# Patient Record
Sex: Male | Born: 1970
Health system: Southern US, Community
[De-identification: ages and names within clinical notes are randomized; demographics above are authoritative.]

## PROBLEM LIST (undated history)

## (undated) DIAGNOSIS — K219 Gastro-esophageal reflux disease without esophagitis: Secondary | ICD-10-CM

## (undated) DIAGNOSIS — S72001S Fracture of unspecified part of neck of right femur, sequela: Secondary | ICD-10-CM

## (undated) DIAGNOSIS — Z8719 Personal history of other diseases of the digestive system: Secondary | ICD-10-CM

## (undated) DIAGNOSIS — M25562 Pain in left knee: Secondary | ICD-10-CM

## (undated) DIAGNOSIS — M25561 Pain in right knee: Secondary | ICD-10-CM

## (undated) DIAGNOSIS — S3992XA Unspecified injury of lower back, initial encounter: Secondary | ICD-10-CM

## (undated) HISTORY — DX: Pain in left knee: M25.562

## (undated) HISTORY — DX: Unspecified injury of lower back, initial encounter: S39.92XA

## (undated) HISTORY — DX: Fracture of unspecified part of neck of right femur, sequela: S72.001S

## (undated) HISTORY — DX: Pain in right knee: M25.561

## (undated) HISTORY — PX: KNEE SURGERY: SHX244

---

## 2012-04-01 ENCOUNTER — Ambulatory Visit (INDEPENDENT_AMBULATORY_CARE_PROVIDER_SITE_OTHER): Payer: BC Managed Care – PPO | Admitting: Sports Medicine

## 2012-04-01 ENCOUNTER — Ambulatory Visit: Payer: Self-pay | Admitting: Physician Assistant

## 2012-04-01 ENCOUNTER — Telehealth: Payer: Self-pay | Admitting: *Deleted

## 2012-04-01 ENCOUNTER — Encounter: Payer: Self-pay | Admitting: Sports Medicine

## 2012-04-01 VITALS — BP 130/91 | HR 113 | Wt 208.0 lb

## 2012-04-01 DIAGNOSIS — R5383 Other fatigue: Secondary | ICD-10-CM | POA: Insufficient documentation

## 2012-04-01 DIAGNOSIS — M5412 Radiculopathy, cervical region: Secondary | ICD-10-CM | POA: Insufficient documentation

## 2012-04-01 DIAGNOSIS — E291 Testicular hypofunction: Secondary | ICD-10-CM

## 2012-04-01 DIAGNOSIS — M25519 Pain in unspecified shoulder: Secondary | ICD-10-CM

## 2012-04-01 DIAGNOSIS — K529 Noninfective gastroenteritis and colitis, unspecified: Secondary | ICD-10-CM | POA: Insufficient documentation

## 2012-04-01 DIAGNOSIS — S022XXA Fracture of nasal bones, initial encounter for closed fracture: Secondary | ICD-10-CM | POA: Insufficient documentation

## 2012-04-01 DIAGNOSIS — M25512 Pain in left shoulder: Secondary | ICD-10-CM

## 2012-04-01 DIAGNOSIS — E785 Hyperlipidemia, unspecified: Secondary | ICD-10-CM

## 2012-04-01 DIAGNOSIS — R197 Diarrhea, unspecified: Secondary | ICD-10-CM

## 2012-04-01 DIAGNOSIS — R5381 Other malaise: Secondary | ICD-10-CM

## 2012-04-01 NOTE — Assessment & Plan Note (Signed)
Likely related to alcohol intake. Checking Some bloodwork including celiac testing.

## 2012-04-01 NOTE — Progress Notes (Signed)
Subjective:    CC: Establish care.   HPI: Jerry Shannon is a very energetic 42 year old male who works for [employer] who comes in with multiple complaints and requests.  Loose stools: Have been present on and off for some time now, worse after drinking large amounts of beer, but not worse after liquor, worse after eating pasta and some redness. No association with dairy consumption. He does get some burning occasionally when stooling. He would like a multitude of blood tests done which will be documented and the assessment and plan section. He denies any abdominal pain, denies any current blood in stool.  Erectile dysfunction: Since he's been drinking approximately 12 beers on the weekends, he's noted his erections are no longer as strong, or long-lasting as before. He denies alcohol could be a possible cause.  Left shoulder pain: Present for months, localized over the deltoid and worse with overhead activities. Pain does not radiate, is moderate.  Past medical history, Surgical history, Family history not pertinant except as noted below, Social history, Allergies, and medications have been entered into the medical record, reviewed, and no changes needed.   Review of Systems: No headache, visual changes, nausea, vomiting, diarrhea, constipation, dizziness, abdominal pain, skin rash, fevers, chills, night sweats, swollen lymph nodes, weight loss, chest pain, body aches, joint swelling, muscle aches, shortness of breath, mood changes, visual or auditory hallucinations.  Objective:    General: Well Developed, well nourished, and in no acute distress.  Neuro: Alert and oriented x3, extra-ocular muscles intact, sensation grossly intact.  HEENT: Normocephalic, atraumatic, pupils equal round reactive to light, neck supple, no masses, no lymphadenopathy, thyroid nonpalpable.  Skin: Warm and dry, no rashes noted.  Cardiac: Regular rate and rhythm, no murmurs rubs or gallops.  Respiratory: Clear to auscultation  bilaterally. Not using accessory muscles, speaking in full sentences.  Abdominal: Soft, nontender, nondistended, positive bowel sounds, no masses, no organomegaly.  Left Shoulder: Inspection reveals no abnormalities, atrophy or asymmetry. Palpation is normal with no tenderness over AC joint or bicipital groove. ROM is full in all planes. Rotator cuff strength normal throughout. Positive neers, Hawkin's, and empty can signs.Marland Kitchen Speeds and Yergason's tests normal. No labral pathology noted with negative Obrien's, negative clunk and good stability. Normal scapular function observed. No painful arc and no drop arm sign. No apprehension sign  Impression and Recommendations:    The patient was counselled, risk factors were discussed, anticipatory guidance given.

## 2012-04-01 NOTE — Assessment & Plan Note (Signed)
Checking some blood work.  

## 2012-04-01 NOTE — Assessment & Plan Note (Signed)
Symptoms most likely related to rotator cuff tear. We'll start conservatively with ibuprofen, home rehabilitation. He will come back to see me in 4 weeks at which point if not better we can scan his cuff and consider an ultrasound guided injection.

## 2012-04-01 NOTE — Addendum Note (Signed)
Addended by: Monica Becton on: 04/01/2012 02:47 PM   Modules accepted: Orders

## 2012-04-01 NOTE — Assessment & Plan Note (Signed)
Patient desires a referral. Specifically states not plastic and reconstructive surgeon. I will place a referral for otolaryngology.

## 2012-04-02 LAB — COMPREHENSIVE METABOLIC PANEL
AST: 31 U/L (ref 0–37)
Albumin: 4.8 g/dL (ref 3.5–5.2)
Alkaline Phosphatase: 61 U/L (ref 39–117)
Calcium: 9.8 mg/dL (ref 8.4–10.5)
Chloride: 106 mEq/L (ref 96–112)
Potassium: 4.2 mEq/L (ref 3.5–5.3)
Sodium: 141 mEq/L (ref 135–145)
Total Protein: 7.1 g/dL (ref 6.0–8.3)

## 2012-04-02 LAB — CBC
HCT: 44.6 % (ref 39.0–52.0)
Hemoglobin: 15.8 g/dL (ref 13.0–17.0)
MCH: 32.6 pg (ref 26.0–34.0)
MCHC: 35.4 g/dL (ref 30.0–36.0)
MCV: 92 fL (ref 78.0–100.0)
Platelets: 339 K/uL (ref 150–400)
RBC: 4.85 MIL/uL (ref 4.22–5.81)
RDW: 13.2 % (ref 11.5–15.5)
WBC: 7.9 10*3/uL (ref 4.0–10.5)

## 2012-04-02 LAB — COMPREHENSIVE METABOLIC PANEL WITH GFR
ALT: 38 U/L (ref 0–53)
BUN: 9 mg/dL (ref 6–23)
CO2: 26 meq/L (ref 19–32)
Creat: 1.06 mg/dL (ref 0.50–1.35)
Glucose, Bld: 87 mg/dL (ref 70–99)
Total Bilirubin: 0.8 mg/dL (ref 0.3–1.2)

## 2012-04-02 LAB — TSH: TSH: 2.043 u[IU]/mL (ref 0.350–4.500)

## 2012-04-02 LAB — T4, FREE: Free T4: 1.08 ng/dL (ref 0.80–1.80)

## 2012-04-02 LAB — VITAMIN D 25 HYDROXY (VIT D DEFICIENCY, FRACTURES): Vit D, 25-Hydroxy: 43 ng/mL (ref 30–89)

## 2012-04-02 LAB — LIPID PANEL
Cholesterol: 329 mg/dL — ABNORMAL HIGH (ref 0–200)
HDL: 35 mg/dL — ABNORMAL LOW (ref >39)
LDL Cholesterol: 218 mg/dL — ABNORMAL HIGH (ref 0–99)
Total CHOL/HDL Ratio: 9.4 ratio
Triglycerides: 382 mg/dL — ABNORMAL HIGH (ref <150)
VLDL: 76 mg/dL — ABNORMAL HIGH (ref 0–40)

## 2012-04-02 LAB — LIPASE: Lipase: 22 U/L (ref 0–75)

## 2012-04-02 LAB — T3, FREE: T3, Free: 3.6 pg/mL (ref 2.3–4.2)

## 2012-04-02 LAB — AMYLASE: Amylase: 54 U/L (ref 0–105)

## 2012-04-04 LAB — TESTOSTERONE, FREE, TOTAL, SHBG
Sex Hormone Binding: 31 nmol/L (ref 13–71)
Testosterone, Free: 36.8 pg/mL — ABNORMAL LOW (ref 47.0–244.0)
Testosterone-% Free: 2 % (ref 1.6–2.9)
Testosterone: 187.52 ng/dL — ABNORMAL LOW (ref 300–890)

## 2012-04-04 LAB — ANCA SCREEN W REFLEX TITER
Atypical p-ANCA Screen: NEGATIVE
c-ANCA Screen: NEGATIVE
p-ANCA Screen: NEGATIVE

## 2012-04-04 LAB — GLIA (IGA/G) + TTG IGA
Deamidated Gliadin Abs, IgG: 5.9 U/mL (ref <20)
Gliadin IgA: 7 U/mL (ref <20)
Tissue Transglutaminase Ab, IgA: 3.1 U/mL (ref <20)

## 2012-04-04 LAB — TISSUE TRANSGLUTAMINASE, IGG: t-Transglutaminase (tTG) IgG: 9 U/mL (ref <20)

## 2012-04-05 LAB — ENDOMYSIAL AB IGA RFLX TITER: Endomysial Screen: NEGATIVE

## 2012-04-05 MED ORDER — TESTOSTERONE 40.5 MG/2.5GM (1.62%) TD GEL: 1.0000 "application " | Freq: Every day | TRANSDERMAL | Status: DC

## 2012-04-05 NOTE — Addendum Note (Signed)
Addended by: Monica Becton on: 04/05/2012 04:08 PM   Modules accepted: Orders

## 2012-04-11 LAB — SACCHAROMYCES CEREVISIAE ANTIBODIES, IGG AND IGA
Saccharomyces cerevisiae IgG: 14.1 Units (ref <=20.0)
Saccharomyces cerevisiae, IgA: 13.4 Units (ref <=20.0)

## 2012-04-14 ENCOUNTER — Telehealth: Payer: Self-pay | Admitting: *Deleted

## 2012-04-14 DIAGNOSIS — E291 Testicular hypofunction: Secondary | ICD-10-CM

## 2012-04-14 NOTE — Telephone Encounter (Signed)
Plastic surgeons generally come in 3 forms, general surgery (great at tummy tucks and breast augmentations), ENT (Great at nasal surgery/deformities), and ophthalmology (great at eyelids, etc etc.)  ENT would probably be the best bet for what he has going on.  He should certainly ask them if there are any options regarding the vein on his nose.  Also after he finishes androgel, we can start testosterone injections.  I would like him to come here q2weeks for shots until we get his levels stabilized then he can do them at home.  We should also recheck his levels after 3 weeks of the androgel treatment.  I will place a future order, if levels are normalized and symptoms not better/unchanged, then no need to even try injections.

## 2012-04-14 NOTE — Telephone Encounter (Signed)
Pt informed and agrees.

## 2012-04-14 NOTE — Telephone Encounter (Signed)
Pt is asking for another alternative for his testosterone med. States the Androgel was $250 after insurance. He is also asking if ENT can fix the blue veins on his nose with the breathing issue and fracture.

## 2012-04-23 ENCOUNTER — Other Ambulatory Visit: Payer: Self-pay

## 2012-04-29 ENCOUNTER — Encounter: Payer: Self-pay | Admitting: Sports Medicine

## 2012-04-29 ENCOUNTER — Ambulatory Visit (INDEPENDENT_AMBULATORY_CARE_PROVIDER_SITE_OTHER): Payer: BC Managed Care – PPO | Admitting: Sports Medicine

## 2012-04-29 VITALS — BP 134/87 | HR 93 | Wt 207.0 lb

## 2012-04-29 DIAGNOSIS — E7849 Other hyperlipidemia: Secondary | ICD-10-CM | POA: Insufficient documentation

## 2012-04-29 DIAGNOSIS — M5416 Radiculopathy, lumbar region: Secondary | ICD-10-CM | POA: Insufficient documentation

## 2012-04-29 DIAGNOSIS — Z79890 Hormone replacement therapy (postmenopausal): Secondary | ICD-10-CM | POA: Insufficient documentation

## 2012-04-29 DIAGNOSIS — E291 Testicular hypofunction: Secondary | ICD-10-CM

## 2012-04-29 DIAGNOSIS — E785 Hyperlipidemia, unspecified: Secondary | ICD-10-CM

## 2012-04-29 DIAGNOSIS — M545 Low back pain, unspecified: Secondary | ICD-10-CM

## 2012-04-29 DIAGNOSIS — S022XXA Fracture of nasal bones, initial encounter for closed fracture: Secondary | ICD-10-CM

## 2012-04-29 DIAGNOSIS — R197 Diarrhea, unspecified: Secondary | ICD-10-CM

## 2012-04-29 DIAGNOSIS — M25512 Pain in left shoulder: Secondary | ICD-10-CM

## 2012-04-29 DIAGNOSIS — M25519 Pain in unspecified shoulder: Secondary | ICD-10-CM

## 2012-04-29 DIAGNOSIS — K529 Noninfective gastroenteritis and colitis, unspecified: Secondary | ICD-10-CM

## 2012-04-29 LAB — LIPID PANEL
Cholesterol: 366 mg/dL — ABNORMAL HIGH (ref 0–200)
HDL: 39 mg/dL — ABNORMAL LOW (ref >39)
Total CHOL/HDL Ratio: 9.4 Ratio
Triglycerides: 495 mg/dL — ABNORMAL HIGH (ref <150)

## 2012-04-29 LAB — CBC
HCT: 47.1 % (ref 39.0–52.0)
Hemoglobin: 16.4 g/dL (ref 13.0–17.0)
MCH: 32.7 pg (ref 26.0–34.0)
MCHC: 34.8 g/dL (ref 30.0–36.0)
MCV: 93.8 fL (ref 78.0–100.0)
Platelets: 300 K/uL (ref 150–400)
RBC: 5.02 MIL/uL (ref 4.22–5.81)
RDW: 13.1 % (ref 11.5–15.5)
WBC: 6.5 10*3/uL (ref 4.0–10.5)

## 2012-04-29 NOTE — Progress Notes (Signed)
Subjective:    CC: Followup  HPI: Male hypogonadism: Has been using AndroGel but this is extremely expensive. He does note an improvement in how he feels. He is somewhat resistant to trying injections, will but will do this if this is the only option.  Left shoulder pain: Improve with rotator cuff exercises as well as decreasing his weight. He desires not to pursue injection.  Low back pain: Amenable to discussing a future visit.  Hyperlipidemia: Has been doing diet and lifestyle modification and is ready for repeat lipid panel.  History of nasal fracture: Canceled his appointment with ear nose and throat.  Chronic diarrhea: Has improved since cutting back on alcohol intake, celiac testing was negative.  Past medical history, Surgical history, Family history not pertinant except as noted below, Social history, Allergies, and medications have been entered into the medical record, reviewed, and no changes needed.   Review of Systems: No fevers, chills, night sweats, weight loss, chest pain, or shortness of breath.   Objective:    General: Well Developed, well nourished, and in no acute distress.  Neuro: Alert and oriented x3, extra-ocular muscles intact, sensation grossly intact.  HEENT: Normocephalic, atraumatic, pupils equal round reactive to light, neck supple, no masses, no lymphadenopathy, thyroid nonpalpable.  Skin: Warm and dry, no rashes. Cardiac: Regular rate and rhythm, no murmurs rubs or gallops.  Respiratory: Clear to auscultation bilaterally. Not using accessory muscles, speaking in full sentences. Impression and Recommendations:

## 2012-04-29 NOTE — Assessment & Plan Note (Signed)
We'll discuss further at future visits.

## 2012-04-29 NOTE — Assessment & Plan Note (Signed)
Continues to improve, continue rotator cuff rehabilitation exercises. Did decline injection, we will keep this in the back of our minds.

## 2012-04-29 NOTE — Assessment & Plan Note (Signed)
Canceled appointment with otolaryngology.

## 2012-04-29 NOTE — Assessment & Plan Note (Signed)
AndroGel was too expensive. We will check with the insurance company to see if there is a cheaper alternative, otherwise we'll proceed with testosterone injections.

## 2012-04-29 NOTE — Assessment & Plan Note (Signed)
Celiac workup negative. Most likely due to excessive alcohol intake, he is cutting back. I recommended that he cut back slowly to avoid withdrawal.

## 2012-04-29 NOTE — Assessment & Plan Note (Signed)
Rechecking a diet modification. If still elevated need to consider statin.

## 2012-05-02 LAB — TESTOSTERONE, FREE, TOTAL, SHBG
Sex Hormone Binding: 27 nmol/L (ref 13–71)
Testosterone, Free: 98.4 pg/mL (ref 47.0–244.0)
Testosterone-% Free: 2.3 % (ref 1.6–2.9)
Testosterone: 429 ng/dL (ref 300–890)

## 2012-06-06 ENCOUNTER — Telehealth: Payer: Self-pay | Admitting: *Deleted

## 2012-06-06 DIAGNOSIS — E291 Testicular hypofunction: Secondary | ICD-10-CM

## 2012-06-06 DIAGNOSIS — E785 Hyperlipidemia, unspecified: Secondary | ICD-10-CM

## 2012-06-07 ENCOUNTER — Ambulatory Visit (INDEPENDENT_AMBULATORY_CARE_PROVIDER_SITE_OTHER): Payer: BC Managed Care – PPO | Admitting: Sports Medicine

## 2012-06-07 ENCOUNTER — Encounter: Payer: Self-pay | Admitting: Sports Medicine

## 2012-06-07 VITALS — BP 118/81 | HR 78 | Wt 196.0 lb

## 2012-06-07 DIAGNOSIS — M545 Low back pain, unspecified: Secondary | ICD-10-CM

## 2012-06-07 LAB — LIPID PANEL
Cholesterol: 261 mg/dL — ABNORMAL HIGH (ref 0–200)
HDL: 42 mg/dL (ref >39)
LDL Cholesterol: 170 mg/dL — ABNORMAL HIGH (ref 0–99)
Total CHOL/HDL Ratio: 6.2 Ratio
Triglycerides: 243 mg/dL — ABNORMAL HIGH (ref <150)
VLDL: 49 mg/dL — ABNORMAL HIGH (ref 0–40)

## 2012-06-07 MED ORDER — MELOXICAM 15 MG PO TABS: ORAL_TABLET | ORAL | Status: DC

## 2012-06-07 MED ORDER — NAPROXEN 500 MG PO TABS: 500.0000 mg | ORAL_TABLET | Freq: Two times a day (BID) | ORAL | Status: DC

## 2012-06-07 MED ORDER — CYCLOBENZAPRINE HCL 10 MG PO TABS: ORAL_TABLET | ORAL | Status: DC

## 2012-06-07 NOTE — Assessment & Plan Note (Signed)
Multilevel degenerative disc disease by patient report in thoracic and lumbar spine. He declines narcotics, tramadol, steroids. He will work on getting her MRI reports as well as discs. We will start conservatively with Mobic, cyclobenzaprine. Home exercises, topical creams. He would come back with MRI discs and we can proceed with interventional injection planning.  He does want printed out prescriptions for naproxen and meloxicam, and he is going to choose only one.

## 2012-06-07 NOTE — Progress Notes (Signed)
  Subjective:    CC: Low back pain  HPI: Jerry Shannon comes in with several years of pain he localizes in his mid to lower lumbar spine. He recalls an episode decades ago of trying to lift an object, feeling a pop, and having back pain since. In the past he has gone to [facility], and MRI of the lumbar and thoracic spine was done showing multilevel thoracic and lumbar degenerative disc disease, facet spondylosis, and spinal stenosis. He does not know the exact levels involved. Currently he is having a flare, pain is in his low back, without much radiation. Worse with flexion and worse with Valsalva. Denies any bowel or bladder changes. He desires to avoid tramadol, narcotics.  Past medical history, Surgical history, Family history not pertinant except as noted below, Social history, Allergies, and medications have been entered into the medical record, reviewed, and no changes needed.   Review of Systems: No fevers, chills, night sweats, weight loss, chest pain, or shortness of breath.   Objective:    General: Well Developed, well nourished, and in no acute distress.  Neuro: Alert and oriented x3, extra-ocular muscles intact, sensation grossly intact.  HEENT: Normocephalic, atraumatic, pupils equal round reactive to light, neck supple, no masses, no lymphadenopathy, thyroid nonpalpable.  Skin: Warm and dry, no rashes. Cardiac: Regular rate and rhythm, no murmurs rubs or gallops.  Respiratory: Clear to auscultation bilaterally. Not using accessory muscles, speaking in full sentences. Back Exam:  Inspection: Unremarkable  Motion: Flexion 45 deg, Extension 45 deg, Side Bending to 45 deg bilaterally,  Rotation to 45 deg bilaterally  SLR laying: Negative  XSLR laying: Negative  Palpable tenderness: None. FABER: negative. Sensory change: Gross sensation intact to all lumbar and sacral dermatomes.  Reflexes: 2+ at both patellar tendons, 2+ at achilles tendons, Babinski's downgoing.    Strength at foot  Plantar-flexion: 5/5 Dorsi-flexion: 5/5 Eversion: 5/5 Inversion: 5/5  Leg strength  Quad: 5/5 Hamstring: 5/5 Hip flexor: 5/5 Hip abductors: 5/5  Gait unremarkable. Impression and Recommendations:

## 2012-06-08 LAB — TESTOSTERONE, FREE, TOTAL, SHBG
Sex Hormone Binding: 41 nmol/L (ref 13–71)
Testosterone, Free: 77 pg/mL (ref 47.0–244.0)
Testosterone-% Free: 1.8 % (ref 1.6–2.9)
Testosterone: 430 ng/dL (ref 300–890)

## 2012-07-20 ENCOUNTER — Telehealth: Payer: Self-pay | Admitting: *Deleted

## 2012-07-20 DIAGNOSIS — E785 Hyperlipidemia, unspecified: Secondary | ICD-10-CM

## 2012-07-20 NOTE — Telephone Encounter (Signed)
Pt informed that he can go to lab Friday

## 2012-07-22 LAB — LIPID PANEL
Cholesterol: 261 mg/dL — ABNORMAL HIGH (ref 0–200)
HDL: 36 mg/dL — ABNORMAL LOW (ref >39)
LDL Cholesterol: 184 mg/dL — ABNORMAL HIGH (ref 0–99)
Total CHOL/HDL Ratio: 7.3 ratio
Triglycerides: 206 mg/dL — ABNORMAL HIGH (ref <150)
VLDL: 41 mg/dL — ABNORMAL HIGH (ref 0–40)

## 2012-08-03 ENCOUNTER — Telehealth: Payer: Self-pay | Admitting: *Deleted

## 2012-08-03 MED ORDER — NIACIN ER (ANTIHYPERLIPIDEMIC) 1000 MG PO TBCR: 1000.0000 mg | EXTENDED_RELEASE_TABLET | Freq: Every day | ORAL | Status: DC

## 2012-08-03 MED ORDER — OMEGA-3-ACID ETHYL ESTERS 1 G PO CAPS: 2.0000 g | ORAL_CAPSULE | Freq: Two times a day (BID) | ORAL | Status: DC

## 2012-08-03 NOTE — Telephone Encounter (Signed)
Pt has called wanting to know if you can call him in some Niacin and fish oil for him to take for his lipids. He is also asking for samples of Cialis. Please advise.  Jerry Cory, LPN

## 2012-08-03 NOTE — Telephone Encounter (Signed)
Called in. OK for Cialis samples, may give one box of the 5 mg tabs.

## 2012-08-04 NOTE — Telephone Encounter (Signed)
Pt informed

## 2013-01-12 ENCOUNTER — Other Ambulatory Visit: Payer: Self-pay

## 2013-06-16 ENCOUNTER — Encounter: Payer: Self-pay | Admitting: Sports Medicine

## 2013-06-16 ENCOUNTER — Ambulatory Visit (INDEPENDENT_AMBULATORY_CARE_PROVIDER_SITE_OTHER): Payer: BC Managed Care – PPO | Admitting: Sports Medicine

## 2013-06-16 VITALS — BP 132/83 | HR 110 | Wt 197.0 lb

## 2013-06-16 DIAGNOSIS — M5416 Radiculopathy, lumbar region: Secondary | ICD-10-CM

## 2013-06-16 DIAGNOSIS — IMO0002 Reserved for concepts with insufficient information to code with codable children: Secondary | ICD-10-CM

## 2013-06-16 DIAGNOSIS — E291 Testicular hypofunction: Secondary | ICD-10-CM

## 2013-06-16 DIAGNOSIS — M5412 Radiculopathy, cervical region: Secondary | ICD-10-CM

## 2013-06-16 DIAGNOSIS — E785 Hyperlipidemia, unspecified: Secondary | ICD-10-CM

## 2013-06-16 LAB — LIPID PANEL
Cholesterol: 364 mg/dL — ABNORMAL HIGH (ref 0–200)
HDL: 49 mg/dL (ref >39)
Total CHOL/HDL Ratio: 7.4 ratio
Triglycerides: 440 mg/dL — ABNORMAL HIGH (ref <150)

## 2013-06-16 NOTE — Assessment & Plan Note (Signed)
Clinically left-sided C5, MRI from 2013 to C5-6 and C6-C7 degenerative disc disease. He will do naproxen twice a day, and we can do an interlaminar epidural if he has no response.

## 2013-06-16 NOTE — Assessment & Plan Note (Signed)
Clinically left L4, MRI from 2013 shows L4-5 and L5-S1 disc protrusion. We discussed the pathophysiology, as well as treatment course for this, he is amenable to doing Aleve twice a day with TUMS, and will return to see me if no better, at which point we could proceed with a left-sided L4-L5 epidural.

## 2013-06-16 NOTE — Assessment & Plan Note (Signed)
Checking lipids

## 2013-06-16 NOTE — Progress Notes (Signed)
Patient ID: Jerry FenderLarry Shannon, male   DOB: 1970-10-25, 43 y.o.   MRN: [id number]  Subjective:    CC: Upper and Lower back pain  HPI:  Jerry Shannon presents today for f/u on his upper and lower back and leg pain today.  He was last seen here one year ago for the same symptoms and was referred for PT and told to bring back copies of past MRI.  MRI showed C5-C6 cord deformity secondary to disc bulging as well as L4-L5 left sided disc protrusion.  Today his symptoms are relatively unchanged from one year ago and consist of mid back pain and muscle spasm as well as burning left leg pain in a predominately L5 distribution.  He states that ice and aleve occasionally alleviates his back pain to a degree but has been unable to find anything that alleviates his leg pain.  He has been seeing a Chiropodistchiropracter and a massage therapist who have been able to "loosen" him up, but he remains in enough pain and stiffness that he is fearful it may soon effect his job.  Patient does not endorse any other symptoms.    Past medical history, Surgical history, Family history not pertinant except as noted below, Social history, Allergies, and medications have been entered into the medical record, reviewed, and no changes needed.   Review of Systems: No fevers, chills, night sweats, weight loss, chest pain, or shortness of breath.   Objective:    General: Well Developed, well nourished, and in no acute distress.  Neuro: Alert and oriented x3, extra-ocular muscles intact, sensation grossly intact.  HEENT: Normocephalic, atraumatic, pupils equal round reactive to light, neck supple, no masses, no lymphadenopathy, thyroid nonpalpable.  Skin: Warm and dry, no rashes. Cardiac: Regular rate and rhythm, no murmurs rubs or gallops, no lower extremity edema.  Respiratory: Clear to auscultation bilaterally. Not using accessory muscles, speaking in full sentences. Back Exam:  Inspection: Unremarkable  Motion: Flexion >45 deg, Extension >45 deg,  Side Bending to 45 deg bilaterally,  Rotation to 45 deg bilaterally  SLR laying: Negative  XSLR laying: Negative  Palpable tenderness: diffusely tender to palpation over entire back. FABER: negative. Sensory change: Gross sensation intact to all lumbar and sacral dermatomes.  Reflexes: 2+ at both patellar tendons, 2+ at achilles tendons, Babinski's downgoing.  Strength at foot  Plantar-flexion: 5/5 Dorsi-flexion: 5/5 Eversion: 5/5 Inversion: 5/5  Leg strength  Quad: 5/5 Hamstring: 4/5 Hip flexor: 5/5 Hip abductors: 5/5  Gait unremarkable.     Impression and Recommendations:    Clinical symptoms correlate with known disc protrusions shown on past MRI.  The patient is amenable to conservative therapy at this point, however does not want steroids.     C5 radiculitis -naproxen bid -continue stretches at home  Lumbar radiculopathy -naproxen bid -stretches  Health Maintenance -check lipids -recheck testosterone

## 2013-06-16 NOTE — Assessment & Plan Note (Signed)
Rechecking testosterone levels. 

## 2013-06-17 LAB — TESTOSTERONE: Testosterone: 549 ng/dL (ref 300–890)

## 2013-06-19 ENCOUNTER — Other Ambulatory Visit: Payer: Self-pay

## 2013-06-19 ENCOUNTER — Encounter: Payer: Self-pay | Admitting: Sports Medicine

## 2013-06-19 DIAGNOSIS — M545 Low back pain, unspecified: Secondary | ICD-10-CM

## 2013-06-19 MED ORDER — NIACIN ER (ANTIHYPERLIPIDEMIC) 1000 MG PO TBCR: 1000.0000 mg | EXTENDED_RELEASE_TABLET | Freq: Every day | ORAL | Status: DC

## 2013-06-19 MED ORDER — OMEGA-3-ACID ETHYL ESTERS 1 G PO CAPS: 2.0000 g | ORAL_CAPSULE | Freq: Two times a day (BID) | ORAL | Status: DC

## 2013-06-19 MED ORDER — TESTOSTERONE 40.5 MG/2.5GM (1.62%) TD GEL: 1.0000 "application " | Freq: Every day | TRANSDERMAL | Status: DC

## 2013-06-19 MED ORDER — CYCLOBENZAPRINE HCL 10 MG PO TABS: ORAL_TABLET | ORAL | Status: DC

## 2013-06-19 MED ORDER — NAPROXEN 500 MG PO TABS: 500.0000 mg | ORAL_TABLET | Freq: Two times a day (BID) | ORAL | Status: DC

## 2013-06-19 NOTE — Telephone Encounter (Signed)
Patient request refill for Flexeril, Naproxen, Niacin, Testosterone, and Lovaza. Rx faxed to [facility] [phone]

## 2013-06-20 ENCOUNTER — Other Ambulatory Visit: Payer: Self-pay

## 2013-06-20 MED ORDER — IBUPROFEN 800 MG PO TABS: 800.0000 mg | ORAL_TABLET | Freq: Three times a day (TID) | ORAL | Status: DC | PRN

## 2013-07-07 ENCOUNTER — Ambulatory Visit: Payer: BC Managed Care – PPO | Admitting: Sports Medicine

## 2013-10-20 ENCOUNTER — Ambulatory Visit (INDEPENDENT_AMBULATORY_CARE_PROVIDER_SITE_OTHER): Payer: BC Managed Care – PPO | Admitting: Sports Medicine

## 2013-10-20 ENCOUNTER — Encounter: Payer: Self-pay | Admitting: Sports Medicine

## 2013-10-20 VITALS — BP 143/96 | HR 96 | Ht 67.0 in | Wt 196.0 lb

## 2013-10-20 DIAGNOSIS — L03115 Cellulitis of right lower limb: Secondary | ICD-10-CM | POA: Insufficient documentation

## 2013-10-20 DIAGNOSIS — L03119 Cellulitis of unspecified part of limb: Secondary | ICD-10-CM

## 2013-10-20 DIAGNOSIS — IMO0002 Reserved for concepts with insufficient information to code with codable children: Secondary | ICD-10-CM

## 2013-10-20 DIAGNOSIS — B356 Tinea cruris: Secondary | ICD-10-CM

## 2013-10-20 DIAGNOSIS — M5416 Radiculopathy, lumbar region: Secondary | ICD-10-CM

## 2013-10-20 DIAGNOSIS — L02419 Cutaneous abscess of limb, unspecified: Secondary | ICD-10-CM

## 2013-10-20 MED ORDER — CLOTRIMAZOLE-BETAMETHASONE 1-0.05 % EX CREA: 1.0000 "application " | TOPICAL_CREAM | Freq: Two times a day (BID) | CUTANEOUS | Status: DC

## 2013-10-20 MED ORDER — MUPIROCIN 2 % EX OINT: TOPICAL_OINTMENT | CUTANEOUS | Status: DC

## 2013-10-20 NOTE — Assessment & Plan Note (Signed)
Topical mupirocin

## 2013-10-20 NOTE — Assessment & Plan Note (Addendum)
At this point we are going to proceed with a left-sided L5-S1 interlaminar epidural. Patient will bring the MRI disc with him. Patient will also return for custom orthotics per request.

## 2013-10-20 NOTE — Progress Notes (Signed)
  Subjective:    CC: Followup  HPI: Jerry Shannon is a very pleasant 43 year old male, he has known lumbar degenerative disease, unfortunately about a month ago he was pushing a lawnmower, felt a pop, with immediate pain and spasm with radiation down his left leg in an L5 versus an S1 distribution, he has already had an MRI at an outside facility that showed moderate L5-S1 degenerative disc disease. He has failed conservative measures in the form of steroids, NSAIDs, amitriptyline, and therapy.  Jock itch: Desires a cream.  Insect bite: Somewhat painful, desires antibiotic cream   Past medical history, Surgical history, Family history not pertinant except as noted below, Social history, Allergies, and medications have been entered into the medical record, reviewed, and no changes needed.   Review of Systems: No fevers, chills, night sweats, weight loss, chest pain, or shortness of breath.   Objective:    General: Well Developed, well nourished, and in no acute distress.  Neuro: Alert and oriented x3, extra-ocular muscles intact, sensation grossly intact.  HEENT: Normocephalic, atraumatic, pupils equal round reactive to light, neck supple, no masses, no lymphadenopathy, thyroid nonpalpable.  Skin: Warm and dry, no rashes. Cardiac: Regular rate and rhythm, no murmurs rubs or gallops, no lower extremity edema.  Respiratory: Clear to auscultation bilaterally. Not using accessory muscles, speaking in full sentences. Back Exam:  Inspection: Unremarkable  Motion: Flexion 45 deg, Extension 45 deg, Side Bending to 45 deg bilaterally,  Rotation to 45 deg bilaterally  SLR laying: Negative  XSLR laying: Negative  Palpable tenderness: None. FABER: negative. Sensory change: Gross sensation intact to all lumbar and sacral dermatomes.  Reflexes: 2+ at both patellar tendons, 2+ at achilles tendons, Babinski's downgoing.  Strength at foot  Plantar-flexion: 5/5 Dorsi-flexion: 5/5 Eversion: 5/5 Inversion: 5/5   Leg strength  Quad: 5/5 Hamstring: 5/5 Hip flexor: 5/5 Hip abductors: 5/5  Gait unremarkable.  Impression and Recommendations:

## 2013-10-20 NOTE — Patient Instructions (Signed)
Please bring MRI disc to epidural injection.

## 2013-10-20 NOTE — Assessment & Plan Note (Signed)
Lotrisone 

## 2013-10-27 ENCOUNTER — Ambulatory Visit
Admission: RE | Admit: 2013-10-27 | Discharge: 2013-10-27 | Disposition: A | Discharge status: Home / Self Care | Payer: BC Managed Care – PPO | Source: Ambulatory Visit | Attending: Sports Medicine | Admitting: Sports Medicine

## 2013-10-27 VITALS — BP 144/88 | HR 80

## 2013-10-27 DIAGNOSIS — M545 Low back pain, unspecified: Secondary | ICD-10-CM

## 2013-10-27 MED ORDER — METHYLPREDNISOLONE ACETATE 40 MG/ML INJ SUSP (RADIOLOG
120.0000 mg | Freq: Once | INTRAMUSCULAR | Status: AC
  Administered 2013-10-27: 120 mg via EPIDURAL

## 2013-10-27 MED ORDER — IOHEXOL 180 MG/ML  SOLN
1.0000 mL | Freq: Once | INTRAMUSCULAR | Status: AC | PRN
  Administered 2013-10-27: 1 mL via EPIDURAL

## 2013-10-27 NOTE — Discharge Instructions (Signed)

## 2013-10-30 ENCOUNTER — Encounter: Payer: BC Managed Care – PPO | Admitting: Sports Medicine

## 2013-10-30 DIAGNOSIS — Z0289 Encounter for other administrative examinations: Secondary | ICD-10-CM

## 2014-08-20 ENCOUNTER — Telehealth: Payer: Self-pay | Admitting: Sports Medicine

## 2014-08-20 DIAGNOSIS — M5416 Radiculopathy, lumbar region: Secondary | ICD-10-CM

## 2014-08-20 NOTE — Telephone Encounter (Signed)
Pt called and wants to know if you can send a referal to  [facility] [place] since he is only home every 2 weeks. He also want to know if he can have a prescription for dds300 since he sits for 10 to 12 hours a day

## 2014-08-21 MED ORDER — AMBULATORY NON FORMULARY MEDICATION: Status: DC

## 2014-08-21 NOTE — Telephone Encounter (Signed)
The name of therapy place is [facility] Address: [address] [place] [address] Phone # [phone] and Dr's name is Lenon Oms  Requesting Chiropractic, decompression, therapy, and deep tissue massage  The presciption should be dds500 and its made by [facility] because the 300 is not covered by his insurance

## 2014-08-21 NOTE — Telephone Encounter (Addendum)
Rx in box. Also there is no "[facility]"  He needs to get me an actual name, and specialty of a provider he wants to see or the actual name of the office.

## 2014-08-21 NOTE — Addendum Note (Signed)
Addended by: Monica Becton on: 08/21/2014 07:51 PM   Modules accepted: Orders

## 2014-08-21 NOTE — Telephone Encounter (Addendum)
Prescription already written for the back brace, I have now placed a referral for the chiropractor of his choice.

## 2014-08-21 NOTE — Addendum Note (Signed)
Addended by: Monica Becton on: 08/21/2014 08:47 AM   Modules accepted: Orders

## 2014-08-24 ENCOUNTER — Other Ambulatory Visit: Payer: Self-pay | Admitting: Sports Medicine

## 2014-08-24 MED ORDER — AMBULATORY NON FORMULARY MEDICATION: Status: DC

## 2014-08-24 NOTE — Telephone Encounter (Signed)
Pt needs printed Rx to take to specialty pharmacy for fill.

## 2014-09-07 ENCOUNTER — Ambulatory Visit: Payer: Self-pay | Admitting: Sports Medicine

## 2014-09-21 ENCOUNTER — Encounter: Payer: Self-pay | Admitting: Sports Medicine

## 2014-09-21 ENCOUNTER — Ambulatory Visit (INDEPENDENT_AMBULATORY_CARE_PROVIDER_SITE_OTHER): Payer: 59

## 2014-09-21 ENCOUNTER — Ambulatory Visit (INDEPENDENT_AMBULATORY_CARE_PROVIDER_SITE_OTHER): Payer: 59 | Admitting: Sports Medicine

## 2014-09-21 VITALS — BP 130/92 | HR 98 | Ht 67.0 in | Wt 199.0 lb

## 2014-09-21 DIAGNOSIS — M25561 Pain in right knee: Secondary | ICD-10-CM

## 2014-09-21 DIAGNOSIS — N5201 Erectile dysfunction due to arterial insufficiency: Secondary | ICD-10-CM

## 2014-09-21 DIAGNOSIS — M25562 Pain in left knee: Secondary | ICD-10-CM

## 2014-09-21 DIAGNOSIS — G4700 Insomnia, unspecified: Secondary | ICD-10-CM

## 2014-09-21 DIAGNOSIS — M170 Bilateral primary osteoarthritis of knee: Secondary | ICD-10-CM | POA: Insufficient documentation

## 2014-09-21 DIAGNOSIS — M5416 Radiculopathy, lumbar region: Secondary | ICD-10-CM

## 2014-09-21 DIAGNOSIS — M7652 Patellar tendinitis, left knee: Secondary | ICD-10-CM | POA: Diagnosis not present

## 2014-09-21 DIAGNOSIS — N529 Male erectile dysfunction, unspecified: Secondary | ICD-10-CM | POA: Insufficient documentation

## 2014-09-21 MED ORDER — SILDENAFIL CITRATE 20 MG PO TABS: 20.0000 mg | ORAL_TABLET | ORAL | Status: DC | PRN

## 2014-09-21 MED ORDER — AMBULATORY NON FORMULARY MEDICATION: Status: DC

## 2014-09-21 MED ORDER — TRAMADOL HCL 50 MG PO TABS: ORAL_TABLET | ORAL | Status: DC

## 2014-09-21 MED ORDER — HYDROXYZINE HCL 50 MG PO TABS: 50.0000 mg | ORAL_TABLET | Freq: Every day | ORAL | Status: DC

## 2014-09-21 NOTE — Assessment & Plan Note (Signed)
Generic sildenafil.

## 2014-09-21 NOTE — Assessment & Plan Note (Signed)
Hydroxyzine.

## 2014-09-21 NOTE — Progress Notes (Addendum)
  Subjective:    CC: Multiple issues  HPI: Left lumbar radiculopathy: MRI approximately 3 years ago that show multilevel facet arthritis as well as a left paracentral L4-L5 disc protrusion, he also had a fairly large left-sided L5-S1 disc protrusion, he responded well for 4 weeks to a left-sided L5-S1 interlaminar epidural but unfortunately have his recurrence of pain, unfortunately on the right side this time. He doesn't desire any further interventional treatment, I would like to consider operative intervention. He has both axial and radicular pain, radicular pain is worse, he is aware of the limitations of operative intervention for relieving axial-type pain.  Left knee pain: Severe, persistent, localized the anterolateral joint line, he is unable to fully extend the knee. History of osteoarthritis, declines any interventional treatment today. He wants to proceed immediately with operative intervention.  Insomnia: Amenable to try hydroxyzine.  Erectile dysfunction: Needs a refill on sildenafil.  Past medical history, Surgical history, Family history not pertinant except as noted below, Social history, Allergies, and medications have been entered into the medical record, reviewed, and no changes needed.   Review of Systems: No fevers, chills, night sweats, weight loss, chest pain, or shortness of breath.   Objective:    General: Well Developed, well nourished, and in no acute distress.  Neuro: Alert and oriented x3, extra-ocular muscles intact, sensation grossly intact.  HEENT: Normocephalic, atraumatic, pupils equal round reactive to light, neck supple, no masses, no lymphadenopathy, thyroid nonpalpable.  Skin: Warm and dry, no rashes. Cardiac: Regular rate and rhythm, no murmurs rubs or gallops, no lower extremity edema.  Respiratory: Clear to auscultation bilaterally. Not using accessory muscles, speaking in full sentences. Left Knee: Only minimal effusion, tender at the anterolateral  joint line with pain on terminal extension as well as terminal flexion. ROM normal in flexion and extension and lower leg rotation. Ligaments with solid consistent endpoints including ACL, PCL, LCL, MCL. Positive McMurray's with pain but no click, positive Thessalonian test, negative Apley's grind. Non painful patellar compression. Patellar and quadriceps tendons unremarkable. Hamstring and quadriceps strength is normal.  Impression and Recommendations:    I spent 40 minutes with this patient, greater than 50% was face-to-face time counseling regarding the above diagnoses

## 2014-09-21 NOTE — Assessment & Plan Note (Signed)
Old MRI from 2013 shows multilevel facet arthritis, a left paracentral disc protrusion at L4-L5 with right-sided facet arthritis, and a moderate degenerative disc disease at L5-S1 without any clear foraminal stenosis. Currently patient is having severe right-sided radiculopathy, he didn't respond for 4 weeks relatively well to a left-sided L5-S1 interlaminar epidural Pain today's predominant right-sided radicular and patient declines any further epidurals, he wants operative intervention and understands that the likelihood is high that we will continue to have axial pain but good that his radicular pain will improve. I'm going to refer him to Dr. Yevette Edwardsumonski with spine surgery, and obtain a new MRI.

## 2014-09-21 NOTE — Assessment & Plan Note (Signed)
Patient declines injection. We are going to get x-rays and an MRI, he probably has a degenerative meniscal tear and only once operative intervention, referral to Dr. Luiz BlareGraves.

## 2014-10-27 ENCOUNTER — Encounter (HOSPITAL_BASED_OUTPATIENT_CLINIC_OR_DEPARTMENT_OTHER): Payer: Self-pay | Admitting: *Deleted

## 2014-10-27 ENCOUNTER — Emergency Department (HOSPITAL_BASED_OUTPATIENT_CLINIC_OR_DEPARTMENT_OTHER)
Admission: EM | Admit: 2014-10-27 | Discharge: 2014-10-27 | Disposition: A | Discharge status: Home / Self Care | Payer: 59 | Attending: Emergency Medicine | Admitting: Emergency Medicine

## 2014-10-27 ENCOUNTER — Emergency Department (HOSPITAL_BASED_OUTPATIENT_CLINIC_OR_DEPARTMENT_OTHER): Payer: 59

## 2014-10-27 DIAGNOSIS — Y9389 Activity, other specified: Secondary | ICD-10-CM | POA: Diagnosis not present

## 2014-10-27 DIAGNOSIS — Z791 Long term (current) use of non-steroidal anti-inflammatories (NSAID): Secondary | ICD-10-CM | POA: Diagnosis not present

## 2014-10-27 DIAGNOSIS — Y998 Other external cause status: Secondary | ICD-10-CM | POA: Insufficient documentation

## 2014-10-27 DIAGNOSIS — Z79899 Other long term (current) drug therapy: Secondary | ICD-10-CM | POA: Diagnosis not present

## 2014-10-27 DIAGNOSIS — Y9289 Other specified places as the place of occurrence of the external cause: Secondary | ICD-10-CM | POA: Diagnosis not present

## 2014-10-27 DIAGNOSIS — M25562 Pain in left knee: Secondary | ICD-10-CM

## 2014-10-27 DIAGNOSIS — T1490XA Injury, unspecified, initial encounter: Secondary | ICD-10-CM

## 2014-10-27 DIAGNOSIS — X58XXXA Exposure to other specified factors, initial encounter: Secondary | ICD-10-CM | POA: Diagnosis not present

## 2014-10-27 DIAGNOSIS — S8992XA Unspecified injury of left lower leg, initial encounter: Secondary | ICD-10-CM | POA: Insufficient documentation

## 2014-10-27 NOTE — Discharge Instructions (Signed)
Please follow-up with Orthopedic specialist for further evaluation and management. Please read attached info.

## 2014-10-27 NOTE — ED Provider Notes (Signed)
CSN: [id number]     Arrival date & time 10/27/14  1239 History   First MD Initiated Contact with Patient 10/27/14 1644     Chief Complaint  Patient presents with  . Knee Pain   HPI   44 year old male presents with left knee pain. Patient has a significant past medical history of left knee pain, has been seen numerous times by his orthopedic provider with plain films showing likely arthritis and questionable "loose body". Patient reports that 2 days ago swimming in a pool he felt a pop in his left knee, reports the pain has continued since then. Patient denies any swelling, edema. Patient reports he was seen in urgent care and was given a knee sleeve. Patient reports that he is scheduled for an MRI, but would like one sooner. Patient reports he is able to ambulate but does have some difficulty. He denies any loss of distal sensation strength or function of the extremity. Patient reports using ibuprofen and Tylenol with some minor relief.  Past Medical History  Diagnosis Date  . Back injury    Past Surgical History  Procedure Laterality Date  . Hernia repair    . Knee surgery     History reviewed. No pertinent family history. Social History  Substance Use Topics  . Smoking status: Never Smoker   . Smokeless tobacco: Current User    Types: Snuff  . Alcohol Use: Yes    Review of Systems  All other systems reviewed and are negative.   Allergies  Review of patient's allergies indicates no known allergies.  Home Medications   Prior to Admission medications   Medication Sig Start Date End Date Taking? Authorizing Provider  AMBULATORY NON FORMULARY MEDICATION DDS 300 support belt 08/24/14   Monica Becton, MD  AMBULATORY NON FORMULARY MEDICATION Flurbiprofen 10%, baclofen 2%, cyclobenzaprine 2%, gabapentin 6%, lidocaine 2% cream. Apply 1-2 g to affected area 3-4 times daily. 09/21/14   Monica Becton, MD  clotrimazole-betamethasone (LOTRISONE) cream Apply 1 application  topically 2 (two) times daily. 10/20/13   Monica Becton, MD  cyclobenzaprine (FLEXERIL) 10 MG tablet One half tab PO qHS, then increase gradually to one tab TID. 06/19/13   Monica Becton, MD  hydrOXYzine (ATARAX/VISTARIL) 50 MG tablet Take 1 tablet (50 mg total) by mouth at bedtime. 09/21/14   Monica Becton, MD  ibuprofen (ADVIL,MOTRIN) 800 MG tablet Take 1 tablet (800 mg total) by mouth every 8 (eight) hours as needed. 06/20/13   Monica Becton, MD  loratadine (CLARITIN) 10 MG tablet Take 10 mg by mouth daily.    Historical Provider, MD  Multiple Vitamin (MULTIVITAMIN) capsule Take 1 capsule by mouth daily.    Historical Provider, MD  mupirocin ointment (BACTROBAN) 2 % Apply to affected area TID for 7 days. 10/20/13   Monica Becton, MD  naproxen (NAPROSYN) 500 MG tablet Take 1 tablet (500 mg total) by mouth 2 (two) times daily with a meal. 06/19/13   Monica Becton, MD  niacin (NIASPAN) 1000 MG CR tablet Take 1 tablet (1,000 mg total) by mouth at bedtime. 06/19/13   Monica Becton, MD  omega-3 acid ethyl esters (LOVAZA) 1 G capsule Take 2 capsules (2 g total) by mouth 2 (two) times daily. 06/19/13   Monica Becton, MD  sildenafil (REVATIO) 20 MG tablet Take 1 tablet (20 mg total) by mouth as needed. 09/21/14   Monica Becton, MD  Testosterone 40.5 MG/2.5GM (1.62%) GEL Apply 1 application  topically daily. Apply to shoulders and upper arms. 06/19/13   Sean Hommel, DO  traMADol (ULTRAM) 50 MG tablet 1-2 tabs by mouth Q8 hours, maximum 6 tabs per day. 09/21/14   Monica Becton, MD   BP 137/93 mmHg  Pulse 108  Temp(Src) 99.5 F (37.5 C) (Oral)  Resp 20  Ht  (1.702 m)  Wt 195 lb (88.451 kg)  BMI 30.53 kg/m2  SpO2 96%   Physical Exam  Constitutional: He is oriented to person, place, and time. He appears well-developed and well-nourished.  HENT:  Head: Normocephalic and atraumatic.  Eyes: Conjunctivae are normal. Pupils are  equal, round, and reactive to light. Right eye exhibits no discharge. Left eye exhibits no discharge. No scleral icterus.  Neck: Normal range of motion. No JVD present. No tracheal deviation present.  Pulmonary/Chest: Effort normal. No stridor.  Musculoskeletal:  No obvious swelling or deformity of the knee, minimally tender to the left anterior tibial plateau. Negative Lachman's, negative posterior drawer, negative valgus varus stress, negative patellar grind.   Neurological: He is alert and oriented to person, place, and time. Coordination normal.  Psychiatric: He has a normal mood and affect. His behavior is normal. Judgment and thought content normal.  Nursing note and vitals reviewed.   ED Course  Procedures (including critical care time) Labs Review Labs Reviewed - No data to display  Imaging Review No results found. I have personally reviewed and evaluated these images and lab results as part of my medical decision-making.   EKG Interpretation None      MDM   Final diagnoses:  Left knee pain    Labs:  Imaging: DG knee four review- no significant findings  Consults:  Therapeutics:  Discharge Meds:   Assessment/Plan: Patient presents with left knee pain chart review shows he's been seen numerous times for this and is being closely followed. Patient reports acute episode on top of chronic knee pain no obvious signs of swelling or trauma to the knee on my exam, no acute fractures or abnormality noted on today's plain films. Patient requested nonemergent MRI of the knee, reports that he is scheduled for 1 and would like it sooner. I informed him that the emergency room use of the MRI machine is not for nonemergent procedures. Patient will be discharged home with instructions to contact his orthopedist for further evaluation.         Eyvonne Mechanic, PA-C 10/29/14 1529  Geoffery Lyons, MD 10/29/14 2258

## 2014-10-27 NOTE — ED Notes (Signed)
Pt swimming Thursday morning, states that he heard and felt a pool. Pain continues in his left knee.  Went to [facility] and was given a knee sleeve.

## 2014-10-29 ENCOUNTER — Ambulatory Visit (INDEPENDENT_AMBULATORY_CARE_PROVIDER_SITE_OTHER): Payer: 59

## 2014-10-29 DIAGNOSIS — M25462 Effusion, left knee: Secondary | ICD-10-CM

## 2014-10-29 DIAGNOSIS — M25562 Pain in left knee: Secondary | ICD-10-CM

## 2014-10-30 ENCOUNTER — Ambulatory Visit (INDEPENDENT_AMBULATORY_CARE_PROVIDER_SITE_OTHER): Payer: 59 | Admitting: Sports Medicine

## 2014-10-30 ENCOUNTER — Encounter: Payer: Self-pay | Admitting: Sports Medicine

## 2014-10-30 ENCOUNTER — Ambulatory Visit (HOSPITAL_COMMUNITY): Admission: RE | Admit: 2014-10-30 | Payer: 59 | Source: Ambulatory Visit

## 2014-10-30 VITALS — BP 146/93 | HR 97 | Ht 67.5 in | Wt 203.0 lb

## 2014-10-30 DIAGNOSIS — M25562 Pain in left knee: Secondary | ICD-10-CM | POA: Diagnosis not present

## 2014-10-30 DIAGNOSIS — M5416 Radiculopathy, lumbar region: Secondary | ICD-10-CM | POA: Diagnosis not present

## 2014-10-30 NOTE — Assessment & Plan Note (Signed)
MRI from 2013 shows multilevel facet arthritis, as well as a left paracentral disc protrusion at the L4-L5 level, right-sided facet arthritis, and moderate degenerative disc disease at L5-S1 without any clear foraminal stenosis. He is having severe right-sided radiculopathy, and did respond to a right-sided L5-S1 interlaminar epidural which we are going to repeat. He should consider all 3 epidurals in the series.

## 2014-10-30 NOTE — Progress Notes (Signed)
  Subjective:    CC: Followup  HPI: Right knee pain: MRI did show an intra-articular loose body as well as lateral meniscal tear, declines injection and is amenable to proceed with surgery.  Lumbar radiculopathy: Did well last year with an epidural, desires a repeat epidural.  Past medical history, Surgical history, Family history not pertinant except as noted below, Social history, Allergies, and medications have been entered into the medical record, reviewed, and no changes needed.   Review of Systems: No fevers, chills, night sweats, weight loss, chest pain, or shortness of breath.   Objective:    General: Well Developed, well nourished, and in no acute distress.  Neuro: Alert and oriented x3, extra-ocular muscles intact, sensation grossly intact.  HEENT: Normocephalic, atraumatic, pupils equal round reactive to light, neck supple, no masses, no lymphadenopathy, thyroid nonpalpable.  Skin: Warm and dry, no rashes. Cardiac: Regular rate and rhythm, no murmurs rubs or gallops, no lower extremity edema.  Respiratory: Clear to auscultation bilaterally. Not using accessory muscles, speaking in full sentences.  Impression and Recommendations:    I spent 25 minutes with this patient, greater than 50% was face-to-face time counseling regarding the above diagnoses

## 2014-10-30 NOTE — Assessment & Plan Note (Signed)
Lateral meniscal tear, intra-articular loose body and bicomponent mental osteoarthritis. He is a candidate for arthroscopy, declines injection.

## 2014-11-02 ENCOUNTER — Ambulatory Visit
Admission: RE | Admit: 2014-11-02 | Discharge: 2014-11-02 | Disposition: A | Discharge status: Home / Self Care | Payer: 59 | Source: Ambulatory Visit | Attending: Sports Medicine | Admitting: Sports Medicine

## 2014-11-02 MED ORDER — IOHEXOL 180 MG/ML  SOLN
1.0000 mL | Freq: Once | INTRAMUSCULAR | Status: DC | PRN
  Administered 2014-11-02: 1 mL via EPIDURAL

## 2014-11-02 MED ORDER — METHYLPREDNISOLONE ACETATE 40 MG/ML INJ SUSP (RADIOLOG
120.0000 mg | Freq: Once | INTRAMUSCULAR | Status: AC
  Administered 2014-11-02: 120 mg via EPIDURAL

## 2014-11-02 NOTE — Discharge Instructions (Signed)

## 2014-11-05 ENCOUNTER — Other Ambulatory Visit: Payer: 59

## 2014-11-05 ENCOUNTER — Other Ambulatory Visit: Payer: Self-pay | Admitting: Orthopedic Surgery

## 2014-11-05 ENCOUNTER — Encounter (HOSPITAL_BASED_OUTPATIENT_CLINIC_OR_DEPARTMENT_OTHER): Payer: Self-pay | Admitting: *Deleted

## 2014-11-05 ENCOUNTER — Ambulatory Visit (INDEPENDENT_AMBULATORY_CARE_PROVIDER_SITE_OTHER): Payer: 59

## 2014-11-05 DIAGNOSIS — M5136 Other intervertebral disc degeneration, lumbar region: Secondary | ICD-10-CM

## 2014-11-05 DIAGNOSIS — M5416 Radiculopathy, lumbar region: Secondary | ICD-10-CM

## 2014-11-06 ENCOUNTER — Encounter: Payer: Self-pay | Admitting: Sports Medicine

## 2014-11-06 ENCOUNTER — Telehealth: Payer: Self-pay

## 2014-11-06 ENCOUNTER — Ambulatory Visit (INDEPENDENT_AMBULATORY_CARE_PROVIDER_SITE_OTHER): Payer: 59 | Admitting: Sports Medicine

## 2014-11-06 VITALS — BP 126/87 | HR 87 | Wt 204.0 lb

## 2014-11-06 DIAGNOSIS — M5416 Radiculopathy, lumbar region: Secondary | ICD-10-CM | POA: Diagnosis not present

## 2014-11-06 DIAGNOSIS — G4719 Other hypersomnia: Secondary | ICD-10-CM

## 2014-11-06 DIAGNOSIS — M25562 Pain in left knee: Secondary | ICD-10-CM | POA: Diagnosis not present

## 2014-11-06 DIAGNOSIS — G4733 Obstructive sleep apnea (adult) (pediatric): Secondary | ICD-10-CM | POA: Insufficient documentation

## 2014-11-06 NOTE — Telephone Encounter (Signed)
Sheldon Silvan from [facility] called stated that she was trying to schedule patient appt for order that was put in for split night study and she stated that patient was told by PCP that he would get a home sleep test done. Please advise. Riyanna Crutchley,CMA

## 2014-11-06 NOTE — Progress Notes (Signed)
  Subjective:    CC: follow-up  HPI: Lumbar radiculopathy: Fantastic response to a left L5-S1 interlaminar epidural, he did have an MRI, the results of which will be dictated below. He does work out of town often, and wonders if he can have epidurals out of state.  Excessive daytime sleepiness: Desires sleep study.  Left knee osteoarthritis with meniscal tearing: He is scheduled for operative arthroscopy tomorrow.  Past medical history, Surgical history, Family history not pertinant except as noted below, Social history, Allergies, and medications have been entered into the medical record, reviewed, and no changes needed.   Review of Systems: No fevers, chills, night sweats, weight loss, chest pain, or shortness of breath.   Objective:    General: Well Developed, well nourished, and in no acute distress.  Neuro: Alert and oriented x3, extra-ocular muscles intact, sensation grossly intact.  HEENT: Normocephalic, atraumatic, pupils equal round reactive to light, neck supple, no masses, no lymphadenopathy, thyroid nonpalpable.  Skin: Warm and dry, no rashes. Cardiac: Regular rate and rhythm, no murmurs rubs or gallops, no lower extremity edema.  Respiratory: Clear to auscultation bilaterally. Not using accessory muscles, speaking in full sentences.  MRI reviewed and shows L4-5 and L5-S1 degenerative disc disease worse at the L5-S1 level with a small central canal component, there may be minimal contact the left exiting L5 nerve root.  Impression and Recommendations:   I spent 25 minutes with this patient, greater than 50% was face-to-face time counseling regarding the above diagnoses

## 2014-11-06 NOTE — Assessment & Plan Note (Signed)
Set up for scope.

## 2014-11-06 NOTE — Assessment & Plan Note (Signed)
Good response to L5-S1 epidural. He does travel, so we may need to order out-of-state  epidurals.  He will obtain a disc with the MRI.

## 2014-11-06 NOTE — Telephone Encounter (Signed)
Noted, thank you Marylene Land.

## 2014-11-06 NOTE — Assessment & Plan Note (Signed)
Setting up for home sleep study.

## 2014-11-06 NOTE — Telephone Encounter (Signed)
Jerry Shannon is extremely anxious about the MRI knee report. He is concerned the report will cause him not to receive insurance benefits for short term disability. I told him that Dr Benjamin Stain will be the one to fill out any insurance papers and to not worry about what the report reads. Also I didn't see why the report would keep him from receiving his benefits. He was not happy with my answer so he hung up.

## 2014-11-06 NOTE — Telephone Encounter (Signed)
Can they facilitate that?  If not lets switch to a company that can, maybe [employer]?

## 2014-11-07 ENCOUNTER — Encounter (HOSPITAL_BASED_OUTPATIENT_CLINIC_OR_DEPARTMENT_OTHER)
Admission: RE | Disposition: A | Discharge status: Home / Self Care | Payer: Self-pay | Source: Ambulatory Visit | Attending: Orthopedic Surgery

## 2014-11-07 ENCOUNTER — Encounter (HOSPITAL_BASED_OUTPATIENT_CLINIC_OR_DEPARTMENT_OTHER): Payer: Self-pay | Admitting: *Deleted

## 2014-11-07 ENCOUNTER — Ambulatory Visit (HOSPITAL_BASED_OUTPATIENT_CLINIC_OR_DEPARTMENT_OTHER): Payer: 59 | Admitting: Anesthesiology

## 2014-11-07 ENCOUNTER — Telehealth: Payer: Self-pay | Admitting: Sports Medicine

## 2014-11-07 ENCOUNTER — Ambulatory Visit (HOSPITAL_BASED_OUTPATIENT_CLINIC_OR_DEPARTMENT_OTHER)
Admission: RE | Admit: 2014-11-07 | Discharge: 2014-11-07 | Disposition: A | Discharge status: Home / Self Care | Payer: 59 | Source: Ambulatory Visit | Attending: Orthopedic Surgery | Admitting: Orthopedic Surgery

## 2014-11-07 DIAGNOSIS — M6752 Plica syndrome, left knee: Secondary | ICD-10-CM | POA: Insufficient documentation

## 2014-11-07 DIAGNOSIS — M23342 Other meniscus derangements, anterior horn of lateral meniscus, left knee: Secondary | ICD-10-CM | POA: Diagnosis not present

## 2014-11-07 HISTORY — PX: CHONDROPLASTY: SHX5177

## 2014-11-07 HISTORY — PX: KNEE ARTHROSCOPY WITH LATERAL MENISECTOMY: SHX6193

## 2014-11-07 HISTORY — DX: Gastro-esophageal reflux disease without esophagitis: K21.9

## 2014-11-07 HISTORY — DX: Personal history of other diseases of the digestive system: Z87.19

## 2014-11-07 LAB — POCT HEMOGLOBIN-HEMACUE: HEMOGLOBIN: 14.9 g/dL (ref 13.0–17.0)

## 2014-11-07 SURGERY — ARTHROSCOPY, KNEE, WITH LATERAL MENISCECTOMY
Anesthesia: General | Site: Knee | Laterality: Left

## 2014-11-07 MED ORDER — ONDANSETRON HCL 4 MG/2ML IJ SOLN
INTRAMUSCULAR | Status: DC | PRN
  Administered 2014-11-07: 4 mg via INTRAVENOUS

## 2014-11-07 MED ORDER — LIDOCAINE HCL (CARDIAC) 20 MG/ML IV SOLN
INTRAVENOUS | Status: AC
  Filled 2014-11-07: qty 5

## 2014-11-07 MED ORDER — PROPOFOL 500 MG/50ML IV EMUL
INTRAVENOUS | Status: AC
  Filled 2014-11-07: qty 50

## 2014-11-07 MED ORDER — CEFAZOLIN SODIUM-DEXTROSE 2-3 GM-% IV SOLR
2.0000 g | INTRAVENOUS | Status: AC
  Administered 2014-11-07: 2 g via INTRAVENOUS

## 2014-11-07 MED ORDER — ONDANSETRON HCL 4 MG/2ML IJ SOLN
INTRAMUSCULAR | Status: AC
  Filled 2014-11-07: qty 2

## 2014-11-07 MED ORDER — FENTANYL CITRATE (PF) 100 MCG/2ML IJ SOLN
INTRAMUSCULAR | Status: AC
  Filled 2014-11-07: qty 4

## 2014-11-07 MED ORDER — SUCCINYLCHOLINE CHLORIDE 20 MG/ML IJ SOLN
INTRAMUSCULAR | Status: AC
  Filled 2014-11-07: qty 1

## 2014-11-07 MED ORDER — FENTANYL CITRATE (PF) 100 MCG/2ML IJ SOLN
25.0000 ug | INTRAMUSCULAR | Status: DC | PRN
  Administered 2014-11-07: 50 ug via INTRAVENOUS

## 2014-11-07 MED ORDER — PROPOFOL 10 MG/ML IV BOLUS
INTRAVENOUS | Status: DC | PRN
  Administered 2014-11-07: 250 mg via INTRAVENOUS

## 2014-11-07 MED ORDER — MIDAZOLAM HCL 2 MG/2ML IJ SOLN: 1.0000 mg | INTRAMUSCULAR | Status: DC | PRN

## 2014-11-07 MED ORDER — BUPIVACAINE HCL (PF) 0.25 % IJ SOLN
INTRAMUSCULAR | Status: AC
  Filled 2014-11-07: qty 60

## 2014-11-07 MED ORDER — CEFAZOLIN SODIUM-DEXTROSE 2-3 GM-% IV SOLR
INTRAVENOUS | Status: AC
  Filled 2014-11-07: qty 50

## 2014-11-07 MED ORDER — MIDAZOLAM HCL 5 MG/5ML IJ SOLN
INTRAMUSCULAR | Status: DC | PRN
  Administered 2014-11-07: 2 mg via INTRAVENOUS

## 2014-11-07 MED ORDER — MIDAZOLAM HCL 2 MG/2ML IJ SOLN
INTRAMUSCULAR | Status: AC
  Filled 2014-11-07: qty 4

## 2014-11-07 MED ORDER — ATROPINE SULFATE 0.1 MG/ML IJ SOLN
INTRAMUSCULAR | Status: AC
  Filled 2014-11-07: qty 10

## 2014-11-07 MED ORDER — ATROPINE SULFATE 0.4 MG/ML IJ SOLN
INTRAMUSCULAR | Status: AC
  Filled 2014-11-07: qty 1

## 2014-11-07 MED ORDER — OXYCODONE HCL 5 MG PO TABS: 5.0000 mg | ORAL_TABLET | Freq: Once | ORAL | Status: DC | PRN

## 2014-11-07 MED ORDER — SCOPOLAMINE 1 MG/3DAYS TD PT72: 1.0000 | MEDICATED_PATCH | Freq: Once | TRANSDERMAL | Status: DC | PRN

## 2014-11-07 MED ORDER — ACETAMINOPHEN 10 MG/ML IV SOLN
INTRAVENOUS | Status: AC
  Filled 2014-11-07: qty 100

## 2014-11-07 MED ORDER — SODIUM CHLORIDE 0.9 % IR SOLN
Status: DC | PRN
  Administered 2014-11-07: 09:00:00

## 2014-11-07 MED ORDER — SODIUM CHLORIDE 0.9 % IR SOLN
Status: DC | PRN
  Administered 2014-11-07: 6000 mL

## 2014-11-07 MED ORDER — OXYCODONE-ACETAMINOPHEN 5-325 MG PO TABS: 1.0000 | ORAL_TABLET | Freq: Four times a day (QID) | ORAL | Status: DC | PRN

## 2014-11-07 MED ORDER — LIDOCAINE HCL (CARDIAC) 20 MG/ML IV SOLN
INTRAVENOUS | Status: DC | PRN
  Administered 2014-11-07: 60 mg via INTRAVENOUS

## 2014-11-07 MED ORDER — LACTATED RINGERS IV SOLN
INTRAVENOUS | Status: DC | PRN
  Administered 2014-11-07 (×2): via INTRAVENOUS

## 2014-11-07 MED ORDER — EPINEPHRINE HCL 1 MG/ML IJ SOLN
INTRAMUSCULAR | Status: AC
  Filled 2014-11-07: qty 1

## 2014-11-07 MED ORDER — BUPIVACAINE HCL (PF) 0.5 % IJ SOLN
INTRAMUSCULAR | Status: DC | PRN
  Administered 2014-11-07: 20 mL

## 2014-11-07 MED ORDER — DEXAMETHASONE SODIUM PHOSPHATE 4 MG/ML IJ SOLN
INTRAMUSCULAR | Status: DC | PRN
  Administered 2014-11-07: 10 mg via INTRAVENOUS

## 2014-11-07 MED ORDER — FAMOTIDINE IN NACL 20-0.9 MG/50ML-% IV SOLN
20.0000 mg | Freq: Once | INTRAVENOUS | Status: AC
  Administered 2014-11-07: 20 mg via INTRAVENOUS

## 2014-11-07 MED ORDER — OXYCODONE HCL 5 MG/5ML PO SOLN: 5.0000 mg | Freq: Once | ORAL | Status: DC | PRN

## 2014-11-07 MED ORDER — PROMETHAZINE HCL 25 MG/ML IJ SOLN: 6.2500 mg | INTRAMUSCULAR | Status: DC | PRN

## 2014-11-07 MED ORDER — SUCCINYLCHOLINE CHLORIDE 20 MG/ML IJ SOLN
INTRAMUSCULAR | Status: DC | PRN
  Administered 2014-11-07: 100 mg via INTRAVENOUS

## 2014-11-07 MED ORDER — FENTANYL CITRATE (PF) 100 MCG/2ML IJ SOLN
INTRAMUSCULAR | Status: AC
  Filled 2014-11-07: qty 2

## 2014-11-07 MED ORDER — BUPIVACAINE HCL (PF) 0.5 % IJ SOLN
INTRAMUSCULAR | Status: AC
  Filled 2014-11-07: qty 60

## 2014-11-07 MED ORDER — FAMOTIDINE IN NACL 20-0.9 MG/50ML-% IV SOLN
INTRAVENOUS | Status: AC
  Filled 2014-11-07: qty 50

## 2014-11-07 MED ORDER — LACTATED RINGERS IV SOLN: INTRAVENOUS | Status: DC

## 2014-11-07 MED ORDER — METOCLOPRAMIDE HCL 5 MG/ML IJ SOLN
INTRAMUSCULAR | Status: DC | PRN
Start: 2014-11-07 — End: 2014-11-07
  Administered 2014-11-07: 10 mg via INTRAVENOUS

## 2014-11-07 MED ORDER — GLYCOPYRROLATE 0.2 MG/ML IJ SOLN: 0.2000 mg | Freq: Once | INTRAMUSCULAR | Status: DC | PRN

## 2014-11-07 MED ORDER — FENTANYL CITRATE (PF) 100 MCG/2ML IJ SOLN
50.0000 ug | INTRAMUSCULAR | Status: DC | PRN
Start: 2014-11-07 — End: 2014-11-07

## 2014-11-07 MED ORDER — FENTANYL CITRATE (PF) 100 MCG/2ML IJ SOLN
INTRAMUSCULAR | Status: DC | PRN
  Administered 2014-11-07: 50 ug via INTRAVENOUS

## 2014-11-07 MED ORDER — CHLORHEXIDINE GLUCONATE 4 % EX LIQD: 60.0000 mL | Freq: Once | CUTANEOUS | Status: DC

## 2014-11-07 MED ORDER — ACETAMINOPHEN 10 MG/ML IV SOLN
INTRAVENOUS | Status: DC | PRN
  Administered 2014-11-07: 1000 mg via INTRAVENOUS

## 2014-11-07 SURGICAL SUPPLY — 39 items
BANDAGE ELASTIC 6 VELCRO ST LF (GAUZE/BANDAGES/DRESSINGS) ×3 IMPLANT
BLADE 4.2CUDA (BLADE) IMPLANT
BLADE GREAT WHITE 4.2 (BLADE) ×3 IMPLANT
CUTTER MENISCUS  4.2MM (BLADE)
CUTTER MENISCUS 4.2MM (BLADE) IMPLANT
DRAPE ARTHROSCOPY W/POUCH 114 (DRAPES) ×3 IMPLANT
DRSG EMULSION OIL 3X3 NADH (GAUZE/BANDAGES/DRESSINGS) ×3 IMPLANT
DURAPREP 26ML APPLICATOR (WOUND CARE) ×3 IMPLANT
ELECT MENISCUS 165MM 90D (ELECTRODE) IMPLANT
ELECT REM PT RETURN 9FT ADLT (ELECTROSURGICAL)
ELECTRODE REM PT RTRN 9FT ADLT (ELECTROSURGICAL) IMPLANT
GAUZE SPONGE 4X4 12PLY STRL (GAUZE/BANDAGES/DRESSINGS) ×3 IMPLANT
GLOVE BIOGEL PI IND STRL 7.0 (GLOVE) ×4 IMPLANT
GLOVE BIOGEL PI IND STRL 8 (GLOVE) ×4 IMPLANT
GLOVE BIOGEL PI INDICATOR 7.0 (GLOVE) ×2
GLOVE BIOGEL PI INDICATOR 8 (GLOVE) ×2
GLOVE ECLIPSE 6.5 STRL STRAW (GLOVE) ×6 IMPLANT
GLOVE ECLIPSE 7.5 STRL STRAW (GLOVE) ×6 IMPLANT
GOWN STRL REUS W/ TWL LRG LVL3 (GOWN DISPOSABLE) ×2 IMPLANT
GOWN STRL REUS W/ TWL XL LVL3 (GOWN DISPOSABLE) ×2 IMPLANT
GOWN STRL REUS W/TWL LRG LVL3 (GOWN DISPOSABLE) ×1
GOWN STRL REUS W/TWL XL LVL3 (GOWN DISPOSABLE) ×4 IMPLANT
HOLDER KNEE FOAM BLUE (MISCELLANEOUS) ×3 IMPLANT
KNEE WRAP E Z 3 GEL PACK (MISCELLANEOUS) ×3 IMPLANT
MANIFOLD NEPTUNE II (INSTRUMENTS) ×3 IMPLANT
NDL SAFETY ECLIPSE 18X1.5 (NEEDLE) ×2 IMPLANT
NEEDLE HYPO 18GX1.5 SHARP (NEEDLE) ×1
PACK ARTHROSCOPY DSU (CUSTOM PROCEDURE TRAY) ×3 IMPLANT
PACK BASIN DAY SURGERY FS (CUSTOM PROCEDURE TRAY) ×3 IMPLANT
PAD CAST 4YDX4 CTTN HI CHSV (CAST SUPPLIES) ×2 IMPLANT
PADDING CAST COTTON 4X4 STRL (CAST SUPPLIES) ×1
PENCIL BUTTON HOLSTER BLD 10FT (ELECTRODE) IMPLANT
SET ARTHROSCOPY TUBING (MISCELLANEOUS) ×1
SET ARTHROSCOPY TUBING LN (MISCELLANEOUS) ×2 IMPLANT
SUT ETHILON 4 0 PS 2 18 (SUTURE) IMPLANT
SYR 5ML LL (SYRINGE) ×3 IMPLANT
TOWEL OR 17X24 6PK STRL BLUE (TOWEL DISPOSABLE) ×3 IMPLANT
TOWEL OR NON WOVEN STRL DISP B (DISPOSABLE) IMPLANT
WATER STERILE IRR 1000ML POUR (IV SOLUTION) ×3 IMPLANT

## 2014-11-07 NOTE — Telephone Encounter (Signed)
Spoke to [place] from [facility] and she stated that they could facilitate a home sleep study but the order has to be changed in epic  to Home sleep Test  Or unattended. Rhonda Cunningham,CMA

## 2014-11-07 NOTE — Telephone Encounter (Signed)
Awesome, new order placed.

## 2014-11-07 NOTE — Anesthesia Preprocedure Evaluation (Addendum)
Anesthesia Evaluation  Patient identified by MRN, date of birth, ID band Patient awake    Reviewed: Allergy & Precautions, H&P , NPO status , Patient's Chart, lab work & pertinent test results  History of Anesthesia Complications Negative for: history of anesthetic complications  Airway Mallampati: IV  TM Distance: >3 FB Neck ROM: full    Dental no notable dental hx.    Pulmonary neg pulmonary ROS,  breath sounds clear to auscultation  Pulmonary exam normal       Cardiovascular negative cardio ROS Normal cardiovascular examRhythm:regular Rate:Normal     Neuro/Psych  Neuromuscular disease (back injury, requires epidural steroid injections, L5/S1)    GI/Hepatic Neg liver ROS, hiatal hernia, GERD- (Symptomatic at baseline)  Medicated,  Endo/Other  negative endocrine ROS  Renal/GU negative Renal ROS     Musculoskeletal   Abdominal (+) + obese,   Peds  Hematology negative hematology ROS (+)   Anesthesia Other Findings NPO appropriate, allergies reviewed Denies active cardiac or pulmonary symptoms, METS > 4 No recent congestive cough or symptoms of upper respiratory infection Did not take ranitidine this am, has symptomatic gerd  Reproductive/Obstetrics negative OB ROS                            Anesthesia Physical Anesthesia Plan  ASA: II  Anesthesia Plan: General   Post-op Pain Management:    Induction: Intravenous  Airway Management Planned: Oral ETT  Additional Equipment:   Intra-op Plan:   Post-operative Plan:   Informed Consent: I have reviewed the patients History and Physical, chart, labs and discussed the procedure including the risks, benefits and alternatives for the proposed anesthesia with the patient or authorized representative who has indicated his/her understanding and acceptance.     Plan Discussed with: Anesthesiologist, CRNA and Surgeon  Anesthesia Plan  Comments: (Symptomatic GERD, will intubate, give IV famotidine, reglan)       Anesthesia Quick Evaluation

## 2014-11-07 NOTE — Telephone Encounter (Signed)
Dr. Illene Silver called today and said that his knee surgery went well and he was ready to start therapy for his knee and back.   CF

## 2014-11-07 NOTE — Anesthesia Postprocedure Evaluation (Signed)
  Anesthesia Post-op Note  Patient: Jerry Shannon  Procedure(s) Performed: Procedure(s) (LRB): Knee arthroscopy with lateral menisectomy, with lateral plica (Left) CHONDROPLASTY (Left)  Patient Location: PACU  Anesthesia Type: General  Level of Consciousness: awake and alert   Airway and Oxygen Therapy: Patient Spontanous Breathing  Post-op Pain: mild  Post-op Assessment: Post-op Vital signs reviewed, Patient's Cardiovascular Status Stable, Respiratory Function Stable, Patent Airway and No signs of Nausea or vomiting  Last Vitals:  Filed Vitals:   11/07/14 0930  BP:   Pulse:   Temp: 36.9 C  Resp:     Post-op Vital Signs: stable   Complications: No apparent anesthesia complications

## 2014-11-07 NOTE — Transfer of Care (Signed)
Immediate Anesthesia Transfer of Care Note  Patient: Jerry Shannon  Procedure(s) Performed: Procedure(s): Knee arthroscopy with lateral menisectomy, with lateral plica (Left) CHONDROPLASTY (Left)  Patient Location: PACU  Anesthesia Type:General  Level of Consciousness: awake, alert , oriented and patient cooperative  Airway & Oxygen Therapy: Patient Spontanous Breathing and Patient connected to face mask oxygen  Post-op Assessment: Report given to RN, Post -op Vital signs reviewed and stable and Patient moving all extremities  Post vital signs: Reviewed and stable  Last Vitals:  Filed Vitals:   11/07/14 0711  BP: 133/86  Pulse: 113  Temp: 36.9 C  Resp: 18    Complications: No apparent anesthesia complications

## 2014-11-07 NOTE — Discharge Instructions (Signed)
POST-OP KNEE ARTHROSCOPY INSTRUCTIONS  °Dr. John Graves/Jim Bethune PA-C ° °Pain °You will be expected to have a moderate amount of pain in the affected knee for approximately two weeks. However, the first two days will be the most severe pain. A prescription has been provided to take as needed for the pain. The pain can be reduced by applying ice packs to the knee for the first 1-2 weeks post surgery. Also, keeping the leg elevated on pillows will help alleviate the pain. If you develop any acute pain or swelling in your calf muscle, please call the doctor. ° °Activity °It is preferred that you stay at bed rest for approximately 24 hours. However, you may go to the bathroom with help. Weight bearing as tolerated. You may begin the knee exercises the day of surgery. Discontinue crutches as the knee pain resolves. ° °Dressing °Keep the dressing dry. If the ace bandage should wrinkle or roll up, this can be rewrapped to prevent ridges in the bandage. You may remove all dressings in 48 hours,  apply bandaids to each wound. You may shower on the 4th day after surgery but no tub bath. ° °Symptoms to report to your doctor °Extreme pain °Extreme swelling °Temperature above 101 degrees °Change in the feeling, color, or movement of your toes °Redness, heat, or swelling at your incision ° °Exercise °If is preferred that as soon as possible you try to do a straight leg raise without bending the knee and concentrate on bringing the heel of your foot off the bed up to approximately 45 degrees and hold for the count of 10 seconds. Repeat this at least 10 times three or four times per day. Additional exercises are provided below. ° °You are encouraged to bend the knee as tolerated. ° °Follow-Up °Call to schedule a follow-up appointment in 5-7 days. Our office # is [phone]. ° °POST-OP EXERCISES ° °Short Arc Quads ° °1. Lie on back with legs straight. Place towel roll under thigh, just above knee. °2. Tighten thigh muscles to  straighten knee and lift heel off bed. °3. Hold for slow count of five, then lower. °4. Do three sets of ten ° ° ° °Straight Leg Raises ° °1. Lie on back with operative leg straight and other leg bent. °2. Keeping operative leg completely straight, slowly lift operative leg so foot is 5 inches off bed. °3. Hold for slow count of five, then lower. °4. Do three sets of ten. ° ° ° °DO BOTH EXERCISES 2 TIMES A DAY ° °Ankle Pumps ° °Work/move the operative ankle and foot up and down 10 times every hour while awake. ° ° °Post Anesthesia Home Care Instructions ° °Activity: °Get plenty of rest for the remainder of the day. A responsible adult should stay with you for 24 hours following the procedure.  °For the next 24 hours, DO NOT: °-Drive a car °-Operate machinery °-Drink alcoholic beverages °-Take any medication unless instructed by your physician °-Make any legal decisions or sign important papers. ° °Meals: °Start with liquid foods such as gelatin or soup. Progress to regular foods as tolerated. Avoid greasy, spicy, heavy foods. If nausea and/or vomiting occur, drink only clear liquids until the nausea and/or vomiting subsides. Call your physician if vomiting continues. ° °Special Instructions/Symptoms: °Your throat may feel dry or sore from the anesthesia or the breathing tube placed in your throat during surgery. If this causes discomfort, gargle with warm salt water. The discomfort should disappear within 24 hours. ° °If you   had a scopolamine patch placed behind your ear for the management of post- operative nausea and/or vomiting: ° °1. The medication in the patch is effective for 72 hours, after which it should be removed.  Wrap patch in a tissue and discard in the trash. Wash hands thoroughly with soap and water. °2. You may remove the patch earlier than 72 hours if you experience unpleasant side effects which may include dry mouth, dizziness or visual disturbances. °3. Avoid touching the patch. Wash your hands  with soap and water after contact with the patch. °  ° °

## 2014-11-07 NOTE — Anesthesia Procedure Notes (Signed)
Procedure Name: Intubation Date/Time: 11/07/2014 8:45 AM Performed by: Curly [place]ores Pre-anesthesia Checklist: Patient identified, Emergency Drugs available, Suction available and Patient being monitored Patient Re-evaluated:Patient Re-evaluated prior to inductionOxygen Delivery Method: Circle System Utilized Preoxygenation: Pre-oxygenation with 100% oxygen Intubation Type: IV induction Ventilation: Mask ventilation without difficulty Laryngoscope Size: Miller, 2, Glidescope and 4 Grade View: Grade III Tube type: Oral Tube size: 8.0 mm Number of attempts: 2 Airway Equipment and Method: Stylet and Video-laryngoscopy Placement Confirmation: ETT inserted through vocal cords under direct vision,  positive ETCO2 and breath sounds checked- equal and bilateral Secured at: 23 cm Tube secured with: Tape Dental Injury: Teeth and Oropharynx as per pre-operative assessment

## 2014-11-07 NOTE — H&P (Signed)
PREOPERATIVE H&P  Chief Complaint: l knee pain  HPI: Jerry Shannon is a 44 y.o. male who presents for evaluation of l knee pain. It has been present for greater than 3 months and has been worsening. He has failed conservative measures. Pain is rated as moderate.  Past Medical History  Diagnosis Date  . Back injury   . History of hiatal hernia   . GERD (gastroesophageal reflux disease)    Past Surgical History  Procedure Laterality Date  . Hernia repair    . Knee surgery     Social History   Social History  . Marital Status: Single    Spouse Name: N/A  . Number of Children: N/A  . Years of Education: N/A   Social History Main Topics  . Smoking status: Never Smoker   . Smokeless tobacco: Current User    Types: Snuff  . Alcohol Use: Yes  . Drug Use: No  . Sexual Activity: Yes   Other Topics Concern  . None   Social History Narrative   History reviewed. No pertinent family history. No Known Allergies Prior to Admission medications   Medication Sig Start Date End Date Taking? Authorizing Provider  ibuprofen (ADVIL,MOTRIN) 800 MG tablet Take 1 tablet (800 mg total) by mouth every 8 (eight) hours as needed. 06/20/13  Yes Monica Becton, MD  loratadine (CLARITIN) 10 MG tablet Take 10 mg by mouth daily.   Yes Historical Provider, MD  Multiple Vitamin (MULTIVITAMIN) capsule Take 1 capsule by mouth daily.   Yes Historical Provider, MD  naproxen (NAPROSYN) 500 MG tablet Take 1 tablet (500 mg total) by mouth 2 (two) times daily with a meal. 06/19/13  Yes Monica Becton, MD  ranitidine (ZANTAC) 150 MG tablet Take 150 mg by mouth 2 (two) times daily.   Yes Historical Provider, MD     Positive ROS: none  All other systems have been reviewed and were otherwise negative with the exception of those mentioned in the HPI and as above.  Physical Exam: Filed Vitals:   11/07/14 0711  BP: 133/86  Pulse: 113  Temp: 98.5 F (36.9 C)  Resp: 18    General: Alert, no acute  distress Cardiovascular: No pedal edema Respiratory: No cyanosis, no use of accessory musculature GI: No organomegaly, abdomen is soft and non-tender Skin: No lesions in the area of chief complaint Neurologic: Sensation intact distally Psychiatric: Patient is competent for consent with normal mood and affect Lymphatic: No axillary or cervical lymphadenopathy  MUSCULOSKELETAL: l knee: painful rom +mcmurray// no instability  MRI: +lmt and loose body  Assessment/Plan: left knee anterior lateral tear Plan for Procedure(s): LEFT KNEE ARTHROSCOPY   The risks benefits and alternatives were discussed with the patient including but not limited to the risks of nonoperative treatment, versus surgical intervention including infection, bleeding, nerve injury, malunion, nonunion, hardware prominence, hardware failure, need for hardware removal, blood clots, cardiopulmonary complications, morbidity, mortality, among others, and they were willing to proceed.  Predicted outcome is good, although there will be at least a six to nine month expected recovery.  Friedrich Harriott L, MD 11/07/2014 8:29 AM

## 2014-11-07 NOTE — Brief Op Note (Signed)
11/07/2014  9:17 AM  PATIENT:  Jerry Shannon  44 y.o. male  PRE-OPERATIVE DIAGNOSIS:  left knee anterior lateral tear  POST-OPERATIVE DIAGNOSIS:  left knee anterior lateral tear  PROCEDURE:  Procedure(s): Knee arthroscopy with lateral menisectomy, with lateral plica (Left) CHONDROPLASTY (Left)  SURGEON:  Surgeon(s) and Role:    * Jodi Geralds, MD - Primary  PHYSICIAN ASSISTANT:   ASSISTANTS: bethune   ANESTHESIA:   general  EBL:  Total I/O In: 500 [I.V.:500] Out: -   BLOOD ADMINISTERED:none  DRAINS: none   LOCAL MEDICATIONS USED:  MARCAINE     SPECIMEN:  No Specimen  DISPOSITION OF SPECIMEN:  N/A  COUNTS:  YES  TOURNIQUET:  * No tourniquets in log *  DICTATION: .Other Dictation: Dictation Number [id number]  PLAN OF CARE: Discharge to home after PACU  PATIENT DISPOSITION:  PACU - hemodynamically stable.   Delay start of Pharmacological VTE agent (>24hrs) due to surgical blood loss or risk of bleeding: no

## 2014-11-08 ENCOUNTER — Encounter (HOSPITAL_BASED_OUTPATIENT_CLINIC_OR_DEPARTMENT_OTHER): Payer: Self-pay | Admitting: Orthopedic Surgery

## 2014-11-08 NOTE — Op Note (Signed)
Jerry Shannon, Jerry Shannon                  ACCOUNT NO.:  [id number]  MEDICAL RECORD NO.:  [id number]  LOCATION:                               FACILITY:  [facility]  PHYSICIAN:  Harvie Junior, M.D.   DATE OF BIRTH:  06-24-1970  DATE OF PROCEDURE:  11/07/2014 DATE OF DISCHARGE:  11/07/2014                              OPERATIVE REPORT   PREOPERATIVE DIAGNOSES: 1. Lateral meniscal tear. 2. Large ostial cartilaginous loose body.  POSTOPERATIVE DIAGNOSES: 1. Lateral meniscal tear. 2. Large ostial cartilaginous loose body. 3. 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 4. Lateral plica.  PROCEDURE: 1. Partial lateral meniscectomy. 2. Chondroplasty lateral femoral condyle down to bleeding bone. 3. Removal of large ostial cartilaginous loose body. 4. Chondroplasty patellofemoral joint down to bleeding bone. 5. Excision of lateral shelf plica.  SURGEON:  Harvie Junior, M.D.  ASSISTANT:  Marshia Ly, PA  ANESTHESIA:  General.  BRIEF HISTORY:  Jerry Shannon is a 44 year old male with a long history of significant complaints of left knee pain.  He had been treated conservatively for prolonged period of time.  After this, MRI was obtained, which showed he had a lateral meniscal tear and no loose body. He had some issues with his back as well, but ultimately wanted to get his knee taken care of so that he could then ultimately address the back and he was brought to the operating room for this procedure.  DESCRIPTION OF PROCEDURE:  The patient was brought to the operating room.  After adequate anesthesia was obtained with general anesthetic, the patient was placed supine on the operating table.  The left leg was prepped draped in usual sterile fashion.  Following this, routine arthroscopic examination of the knee revealed the patellofemoral joint initially looked quite good.  There was a large lateral shelf plica, which was sort of impinging the  patellofemoral joint.  I went down the medial compartment.  Medial compartment showed a normal medial meniscus. Normal medial femoral condyle.  ACL was normal.  Attention turned to the anterolateral area where there was a large loose body, which was identified as well as a large lateral anterior horn, lateral meniscal tear.  We started debriding the meniscal tear and this large loose body popped up out of the tibial plateau and it actually looked like it had just been almost a sitting piece in the tibial plateau.  This was removed with a grasper.  Attention was then turned to the defect and in that remaining portion, there was a fair amount of chondral injury in this area and unfortunately we went right down to bleeding bone with forceps and resected some of the loose pieces of cartilage and then removed them with a suction shaver.  Finished up the anterolateral horn, debrided the remainder portion of the lateral tibial plateau.  Lateral femoral condyle had some grade 2 change, but not dramatic, but it was the lateral tibial plateau where the greatest change was and that was quite significant.  Attention was turned to the patellofemoral joint where now on the patellofemoral trochlea there was some corresponding area of injury and this was  debrided back to a smooth and stable rim of articular cartilage, this was down to bleeding bone in the patellofemoral trochlea as well.  Attention was turned to the lateral side where we did a lateral plica excision just to clean up the knee and then the knee was copiously and thoroughly irrigated and suctioned dry. Final check was made for loose fragment pieces.  Seeing none, the knee was irrigated, suctioned dry, and the medial portal needed a single stitch because it was relatively larger to remove this large loose body. At this point, a sterile compressive dressing was applied.  20 mL of 0.25% Marcaine was instilled in the knee for postoperative  anesthesia. The patient was taken to recovery room and was noted to be in a satisfactory condition.  Estimated blood loss for the procedure was minimal.     Harvie Junior, M.D.     Ranae Plumber  D:  11/07/2014  T:  11/07/2014  Job:  [id number]

## 2014-11-15 ENCOUNTER — Ambulatory Visit (INDEPENDENT_AMBULATORY_CARE_PROVIDER_SITE_OTHER): Payer: 59 | Admitting: Physical Therapy

## 2014-11-15 ENCOUNTER — Encounter: Payer: Self-pay | Admitting: Physical Therapy

## 2014-11-15 DIAGNOSIS — M25562 Pain in left knee: Secondary | ICD-10-CM | POA: Diagnosis not present

## 2014-11-15 DIAGNOSIS — R29898 Other symptoms and signs involving the musculoskeletal system: Secondary | ICD-10-CM | POA: Diagnosis not present

## 2014-11-15 DIAGNOSIS — R269 Unspecified abnormalities of gait and mobility: Secondary | ICD-10-CM | POA: Diagnosis not present

## 2014-11-15 DIAGNOSIS — M25662 Stiffness of left knee, not elsewhere classified: Secondary | ICD-10-CM | POA: Diagnosis not present

## 2014-11-15 NOTE — Patient Instructions (Signed)
Quad Set    [place] [phone]   With other leg bent, foot flat, slowly tighten muscles on thigh of straight leg while counting out loud to _10___. Repeat with other leg. Repeat __10__ times. Do _2-3___ sessions per day.  Hamstring Step 1   Straighten left knee. Keep knee level with other knee or on bolster. Hold _30-45__ seconds. Relax knee by returning foot to start. Do with both legs. Repeat _1-2__ times. 2-3 times a day.   Straight Leg Raise   Tighten stomach and slowly raise locked right leg _8-10___ inches from floor. Repeat _10___ times per set. Do ___2-3_ sets per session. Do _2-3___ sessions per day.  PRONE KNEE BEND: Mobilization II   Lie on stomach. Bend left knee, pull with a strap until gentle stretch is felt in the front of the leg. Hold 30-45 secs. Do _1__ sets of _1-2__ repetitions per session. Do _1-2__ sessions per day. Repeat on both legs.   Copyright  VHI. All rights reserved.

## 2014-11-15 NOTE — Therapy (Signed)
[facility] [place] [address] [place], [place], [address] Phone: [phone]   Fax:  [phone]  Physical Therapy Evaluation  Patient Details  Name: Jerry Shannon MRN: [id number] Date of Birth: Mar 28, 1970 Referring Provider:  Jodi Geralds, MD  Encounter Date: 11/15/2014      PT End of Session - 11/15/14 1609    Visit Number 1   Number of Visits 10   Date for PT Re-Evaluation 12/06/14   PT Start Time 1415   PT Stop Time 1527   PT Time Calculation (min) 72 min   Activity Tolerance Patient tolerated treatment well      Past Medical History  Diagnosis Date  . Back injury   . History of hiatal hernia   . GERD (gastroesophageal reflux disease)     Past Surgical History  Procedure Laterality Date  . Hernia repair    . Knee surgery    . Knee arthroscopy with lateral menisectomy Left 11/07/2014    Procedure: Knee arthroscopy with lateral menisectomy, with lateral plica;  Surgeon: Jodi Geralds, MD;  Location: [facility];  Service: Orthopedics;  Laterality: Left;  . Chondroplasty Left 11/07/2014    Procedure: CHONDROPLASTY;  Surgeon: Jodi Geralds, MD;  Location: [facility];  Service: Orthopedics;  Laterality: Left;    There were no vitals filed for this visit.  Visit Diagnosis:  Stiffness of knee joint, left - Plan: PT plan of care cert/re-cert  Weakness of left leg - Plan: PT plan of care cert/re-cert  Abnormality of gait - Plan: PT plan of care cert/re-cert  Pain in knee joint, left - Plan: PT plan of care cert/re-cert      Subjective Assessment - 11/15/14 1407    Subjective Pt had elective Lt knee scope 11/07/14, needs to have therapy to increase strength so he can return to work. He reports on 10/25/14 he was swimiming and had a pop in his knee, was unable to bear weight. Was seen by ortho and had  the knee scoped . His knee is feeling better already and he is able to bear weight and walk now  without an assitive device.     Pertinent History had knee immobilizer for one day after surgery. Had been using crutches or cane up until last Saturday. h/o back problems 5 discs ruptured 3 yrs ago, had an epidural a week ago Friday this has helped.  He is looking to get an order for his back.    How long can you sit comfortably? tolerates 10' to 1 hr depending on back pain.    How long can you stand comfortably? 5'   How long can you walk comfortably? household ambulation now without assistive device, no stairs.    Diagnostic tests x-ray, MRI    Patient Stated Goals return to work, has to be able to be cleared as fit, climb ladder, steel, in a ceiling,  lift up to 50#, stand all day. Train/work out, bike elliptical.    Currently in Pain? No/denies  stiffness with sitting, up to 7/10 sharp pain occassionally with standing and walking, usually nagging pain 2/10 . Back is painfree since epidural             [facility] PT Assessment - 11/15/14 0001    Assessment   Medical Diagnosis Lt knee scope   Onset Date/Surgical Date 11/07/14   Next MD Visit 12/06/14   Prior Therapy none   Precautions   Precautions None   Balance  Screen   Has the patient fallen in the past 6 months Yes   How many times? 2  after injury on the 18th in the pool   Has the patient had a decrease in activity level because of a fear of falling?  Yes  due to surgery   Is the patient reluctant to leave their home because of a fear of falling?  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 Travel drive and fly     Leisure work out   Observation/Other Assessments   Focus on Therapeutic Outcomes (FOTO)  72% limited   Posture/Postural Control   Posture/Postural Control --  (+) edema Lt  knee   Posture Comments stands with LE's externally rotated   ROM / Strength   AROM / PROM / Strength AROM;Strength;PROM   AROM   AROM Assessment Site Knee   Right/Left Knee Left;Right   Right Knee Extension 0   Right Knee Flexion 137   Left Knee Extension -4   Left Knee Flexion 117   PROM   PROM Assessment Site Knee   Right/Left Knee Right;Left   Right Knee Extension --   Left Knee Extension -2   Left Knee Flexion 125   Strength   Strength Assessment Site Hip;Knee   Right/Left Hip Left  Rt  WNL   Left Hip Flexion 5/5   Left Hip Extension --  5-/5   Left Hip ABduction 4+/5   Right/Left Knee Left  Rt WNL   Left Knee Flexion 4+/5   Left Knee Extension 4+/5   Flexibility   Soft Tissue Assessment /Muscle Length yes   Hamstrings Rt 50, Lt 35   Quadriceps tight prone    ITB Lt tight distally   Palpation   Patella mobility good in all directions   Ambulation/Gait   Ambulation/Gait Yes   Ambulation/Gait Assistance 7: Independent   Gait Pattern Antalgic;Decreased hip/knee flexion - left;Decreased stance time - left;Decreased step length - left  LE's externally rotated Lt > Rt   Balance   Balance Assessed --  SLS > 12 sec bilat, increased accessory motion                   [facility] Adult PT Treatment/Exercise - 11/15/14 0001    Exercises   Exercises Knee/Hip   Knee/Hip Exercises: Stretches   Passive Hamstring Stretch Both;2 reps;30 seconds  with strap   Quad Stretch Both;2 reps;30 seconds  prone with strap   Knee/Hip Exercises: Aerobic   Stationary Bike x10' for ROM   Knee/Hip Exercises: Supine   Quad Sets Left;Strengthening;1 set;10 reps  10 sec holds   Straight Leg Raises Strengthening;Left;20 reps  VC for form    Modalities   Modalities Electrical Stimulation;Vasopneumatic   Programme researcher, broadcasting/film/video Location Lt knee   Electrical Stimulation Action ion repelling   Electrical Stimulation Parameters to tolerance   Electrical  Stimulation Goals Edema   Vasopneumatic   Number Minutes Vasopneumatic  15 minutes   Vasopnuematic Location  Knee   Vasopneumatic Pressure Medium   Vasopneumatic Temperature  3*                PT Education - 11/15/14 1602    Education  provided Yes   Education Details HEP   Person(s) Educated Patient   Methods Explanation;Handout   Comprehension Returned demonstration             PT Long Term Goals - 11/15/14 1408    PT LONG TERM GOAL #1   Title I with HEP ( 12/06/14)   Time 3   Period Weeks   Status New   PT LONG TERM GOAL #2   Title demo Lt knee flexion with in 5 degrees of Rt ( 12/06/14)    Time 3   Period Weeks   Status New   PT LONG TERM GOAL #3   Title report pain decrease =/> 75% with daily acitivities ( 12/06/14)    Time 3   Period Weeks   Status New   PT LONG TERM GOAL #4   Title increase LE strength =/> 5-/5 to allow return to function and work ( 12/06/14)    Time 3   Period Weeks   Status New   PT LONG TERM GOAL #5   Title improve FOTO =/< 42% limited  ( 12/06/14)   Time 3   Period Weeks   Status New   Additional Long Term Goals   Additional Long Term Goals Yes   PT LONG TERM GOAL #6   Title climb a ladder without difficulty or pain (12/06/14)   Time 3   Period Weeks   Status New               Plan - 11/15/14 1610    Clinical Impression Statement 44 yo male s/p elective Lt knee scope on 11/07/14 as a result of injury on 10/25/14.  He presents very anxious to start therapy and get better.  Patient's progress may be  a little slower as he also has back pain that interferes with some ability to perform tasks.  He has decreased Lt knee ROM and LE strength. Gait abnormalities along with edema in the Lt knee   Pt will benefit from skilled therapeutic intervention in order to improve on the following deficits Abnormal gait;Decreased range of motion;Difficulty walking;Pain;Decreased strength;Increased edema   Rehab Potential Excellent   PT  Frequency 3x / week   PT Duration 3 weeks   PT Treatment/Interventions Cryotherapy;Electrical Stimulation;Moist Heat;Therapeutic exercise;Manual techniques;Vasopneumatic Device;Patient/family education;Ultrasound;Traction   PT Next Visit Plan progress HEP   Consulted and Agree with Plan of Care Patient         Problem List Patient Active Problem List   Diagnosis Date Noted  . Excessive daytime sleepiness 11/06/2014  . Left knee pain 09/21/2014  . Insomnia 09/21/2014  . Erectile dysfunction 09/21/2014  . Tinea cruris 10/20/2013  . Hyperlipidemia LDL goal < 100 04/29/2012  . Left lumbar radiculopathy 04/29/2012  . Hypogonadism male 04/29/2012  . Fatigue 04/01/2012  . Radiculitis of left cervical region 04/01/2012  . Chronic diarrhea 04/01/2012  . History of nasal fracture 04/01/2012    Roderic Scarce PT 11/15/2014, 5:13 PM  [facility] [address] [place], [place], [address] Phone: [phone]   Fax:  [phone]

## 2014-11-16 ENCOUNTER — Encounter: Payer: Self-pay | Admitting: Rehabilitative and Restorative Service Providers"

## 2014-11-16 ENCOUNTER — Ambulatory Visit (INDEPENDENT_AMBULATORY_CARE_PROVIDER_SITE_OTHER): Payer: 59 | Admitting: Rehabilitative and Restorative Service Providers"

## 2014-11-16 DIAGNOSIS — M25662 Stiffness of left knee, not elsewhere classified: Secondary | ICD-10-CM | POA: Diagnosis not present

## 2014-11-16 DIAGNOSIS — R269 Unspecified abnormalities of gait and mobility: Secondary | ICD-10-CM | POA: Diagnosis not present

## 2014-11-16 DIAGNOSIS — M25562 Pain in left knee: Secondary | ICD-10-CM

## 2014-11-16 DIAGNOSIS — R29898 Other symptoms and signs involving the musculoskeletal system: Secondary | ICD-10-CM | POA: Diagnosis not present

## 2014-11-16 NOTE — Patient Instructions (Signed)
Abdominal Bracing With Pelvic Floor (Hook-Lying)   With neutral spine, tighten pelvic floor and abdominals, tighten muscles in back at waist.  Hold 10 sec. Repeat _10__ times. Do _several__ times a day.  Straight Leg Raise: With External Leg Rotation   Lie on back with right leg straight, opposite leg bent. Rotate straight leg out and lift __10-12__ inches. Repeat __10__ times per set. Do ___1-3_ sets per session. Do __1-2__ sessions per day.  Pelvic: Stabilization (on Floor)   Lie on back, ball between knees. Use roller as guide to keep hips level. Squeeze ball with inner muscles  Hold __10_ seconds. Repeat _10__ times. Do _1-2__ sessions per day. Squeeze only for now  SINGLE LIMB STANCE   Stance: single leg on floor. Raise leg. Hold __60_ seconds. Repeat with other leg. _2-3 reps per set, __1-2_ sets per day

## 2014-11-16 NOTE — Therapy (Signed)
[facility] [place] [address] [place], [place], [address] Phone: [phone]   Fax:  [phone]  Physical Therapy Treatment  Patient Details  Name: Jerry Shannon MRN: [id number] Date of Birth: 12-22-1970 Referring Provider:  Monica Becton,*  Encounter Date: 11/16/2014      PT End of Session - 11/16/14 1037    Visit Number 2   Number of Visits 10   Date for PT Re-Evaluation 12/06/14   PT Start Time 0846   PT Stop Time 0939   PT Time Calculation (min) 53 min   Activity Tolerance Patient tolerated treatment well      Past Medical History  Diagnosis Date  . Back injury   . History of hiatal hernia   . GERD (gastroesophageal reflux disease)     Past Surgical History  Procedure Laterality Date  . Hernia repair    . Knee surgery    . Knee arthroscopy with lateral menisectomy Left 11/07/2014    Procedure: Knee arthroscopy with lateral menisectomy, with lateral plica;  Surgeon: Jodi Geralds, MD;  Location: [facility];  Service: Orthopedics;  Laterality: Left;  . Chondroplasty Left 11/07/2014    Procedure: CHONDROPLASTY;  Surgeon: Jodi Geralds, MD;  Location: [facility];  Service: Orthopedics;  Laterality: Left;    There were no vitals filed for this visit.  Visit Diagnosis:  Stiffness of knee joint, left  Weakness of left leg  Abnormality of gait  Pain in knee joint, left      Subjective Assessment - 11/16/14 0848    Subjective Still has pain in the lt knee - has been working on exercises at home   Currently in Pain? Yes   Pain Score 1    Pain Location Knee   Pain Orientation Left   Pain Descriptors / Indicators Sharp   Pain Type Surgical pain            [facility] PT Assessment - 11/16/14 0001    AROM   Left Knee Extension -1   Left Knee Flexion 132                     [facility] Adult PT Treatment/Exercise - 11/16/14 0001    Neuro Re-ed    Neuro Re-ed Details   3 part core 10 sec hold 10 reps working to improve core stability    Knee/Hip Exercises: Stretches   Passive Hamstring Stretch Both;2 reps;30 seconds  with strap   Quad Stretch Both;2 reps;30 seconds  prone with strap   Knee/Hip Exercises: Aerobic   Stationary Bike L2 - 5 min ROM   Knee/Hip Exercises: Standing   Other Standing Knee Exercises single leg stance 30 sec x 3    Knee/Hip Exercises: Supine   Quad Sets Left;Strengthening;1 set;10 reps  10 sec holds   Quad Sets Limitations small foam roll under knee for tactile cue with extension x10; large foam roll under heel x5   Hip Adduction Isometric Strengthening;Both;2 sets;10 reps  with ball   Straight Leg Raises Strengthening;Left;20 reps  VC for form    Straight Leg Raise with External Rotation Strengthening;Left;1 set;10 reps   Other Supine Knee/Hip Exercises knee flexion 3-4 reps    Knee/Hip Exercises: Sidelying   Hip ABduction Strengthening;10 reps;2 sets  trunk rolled forward; hip extended; leading up with heel   Modalities   Modalities Electrical Stimulation;Vasopneumatic   Electrical Stimulation   Electrical Stimulation Location Lt knee   Electrical Stimulation Action ion  repel   Electrical Stimulation Parameters to tolerance   Electrical Stimulation Goals Edema   Vasopneumatic   Number Minutes Vasopneumatic  15 minutes   Vasopnuematic Location  Knee   Vasopneumatic Pressure Medium   Vasopneumatic Temperature  3*                PT Education - 11/16/14 0934    Education provided Yes   Education Details core stabilization; HEP   Person(s) Educated Patient   Methods Explanation;Demonstration;Tactile cues;Verbal cues;Handout   Comprehension Verbalized understanding;Returned demonstration;Verbal cues required;Tactile cues required             PT Long Term Goals - 11/15/14 1408    PT LONG TERM GOAL #1   Title I with HEP ( 12/06/14)   Time 3   Period Weeks   Status New   PT LONG TERM GOAL #2   Title  demo Lt knee flexion with in 5 degrees of Rt ( 12/06/14)    Time 3   Period Weeks   Status New   PT LONG TERM GOAL #3   Title report pain decrease =/> 75% with daily acitivities ( 12/06/14)    Time 3   Period Weeks   Status New   PT LONG TERM GOAL #4   Title increase LE strength =/> 5-/5 to allow return to function and work ( 12/06/14)    Time 3   Period Weeks   Status New   PT LONG TERM GOAL #5   Title improve FOTO =/< 42% limited  ( 12/06/14)   Time 3   Period Weeks   Status New   Additional Long Term Goals   Additional Long Term Goals Yes   PT LONG TERM GOAL #6   Title climb a ladder without difficulty or pain (12/06/14)   Time 3   Period Weeks   Status New               Plan - 11/16/14 1038    Clinical Impression Statement Some soreness with beginning exercises yesterday. Good improvement in knee ROM noted today. Back spasms reported with several exercises. Addressed with instruction in core stabilization and ice pack post exercise.    Pt will benefit from skilled therapeutic intervention in order to improve on the following deficits Abnormal gait;Decreased range of motion;Difficulty walking;Pain;Decreased strength;Increased edema   Rehab Potential Excellent   PT Frequency 2x / week   PT Duration 3 weeks   PT Treatment/Interventions Cryotherapy;Electrical Stimulation;Moist Heat;Therapeutic exercise;Manual techniques;Vasopneumatic Device;Patient/family education;Ultrasound;Traction   PT Next Visit Plan progress strengthening; HEP   PT Home Exercise Plan HEP add 3 part core for stabilization   Consulted and Agree with Plan of Care Patient        Problem List Patient Active Problem List   Diagnosis Date Noted  . Excessive daytime sleepiness 11/06/2014  . Left knee pain 09/21/2014  . Insomnia 09/21/2014  . Erectile dysfunction 09/21/2014  . Tinea cruris 10/20/2013  . Hyperlipidemia LDL goal < 100 04/29/2012  . Left lumbar radiculopathy 04/29/2012  .  Hypogonadism male 04/29/2012  . Fatigue 04/01/2012  . Radiculitis of left cervical region 04/01/2012  . Chronic diarrhea 04/01/2012  . History of nasal fracture 04/01/2012    Merek Niu Rober Minion  PT, MPH  11/16/2014, 10:42 AM  [facility] [address] [place], [place], [address] Phone: [phone]   Fax:  [phone]

## 2014-11-19 ENCOUNTER — Encounter: Payer: Self-pay | Admitting: Sports Medicine

## 2014-11-19 ENCOUNTER — Ambulatory Visit (INDEPENDENT_AMBULATORY_CARE_PROVIDER_SITE_OTHER): Payer: 59 | Admitting: Sports Medicine

## 2014-11-19 ENCOUNTER — Ambulatory Visit (INDEPENDENT_AMBULATORY_CARE_PROVIDER_SITE_OTHER): Payer: 59 | Admitting: Physical Therapy

## 2014-11-19 ENCOUNTER — Telehealth: Payer: Self-pay | Admitting: Sports Medicine

## 2014-11-19 VITALS — BP 148/103 | HR 119 | Ht 67.5 in | Wt 197.0 lb

## 2014-11-19 DIAGNOSIS — M25662 Stiffness of left knee, not elsewhere classified: Secondary | ICD-10-CM

## 2014-11-19 DIAGNOSIS — R269 Unspecified abnormalities of gait and mobility: Secondary | ICD-10-CM

## 2014-11-19 DIAGNOSIS — M5416 Radiculopathy, lumbar region: Secondary | ICD-10-CM | POA: Diagnosis not present

## 2014-11-19 DIAGNOSIS — M1712 Unilateral primary osteoarthritis, left knee: Secondary | ICD-10-CM | POA: Diagnosis not present

## 2014-11-19 DIAGNOSIS — M25562 Pain in left knee: Secondary | ICD-10-CM

## 2014-11-19 DIAGNOSIS — R29898 Other symptoms and signs involving the musculoskeletal system: Secondary | ICD-10-CM

## 2014-11-19 NOTE — Assessment & Plan Note (Signed)
With a lateral meniscal tear and intra-articular loose body as well as areas of chondromalacia. He is doing extremely well now postarthroscopy by Dr. Luiz Blare. I'm going to get him set up for viscosupplementation. This injury was sustained off work, and was an accidental injury, I'm going to write him a letter in hopes that this can be covered under his accident insurance.

## 2014-11-19 NOTE — Progress Notes (Signed)
  Subjective:    CC: follow-up  HPI: Left knee pain: Jerry Shannon injured his knee last month, he subsequently was diagnosed by me with a lateral meniscal tear and intra-articularly his body, and has now had an arthroscopy and is doing much better.  Lumbar radiculopathy: Right-sided, S1 distribution, good response to previous epidural.  Past medical history, Surgical history, Family history not pertinant except as noted below, Social history, Allergies, and medications have been entered into the medical record, reviewed, and no changes needed.   Review of Systems: No fevers, chills, night sweats, weight loss, chest pain, or shortness of breath.   Objective:    General: Well Developed, well nourished, and in no acute distress.  Neuro: Alert and oriented x3, extra-ocular muscles intact, sensation grossly intact.  HEENT: Normocephalic, atraumatic, pupils equal round reactive to light, neck supple, no masses, no lymphadenopathy, thyroid nonpalpable.  Skin: Warm and dry, no rashes. Cardiac: Regular rate and rhythm, no murmurs rubs or gallops, no lower extremity edema.  Respiratory: Clear to auscultation bilaterally. Not using accessory muscles, speaking in full sentences.  Impression and Recommendations:   I spent 25 minutes with this patient, greater than 50% was face-to-face time counseling regarding the above diagnoses

## 2014-11-19 NOTE — Telephone Encounter (Signed)
Submitted for approval on [employer]. Awaiting confirmation.  

## 2014-11-19 NOTE — Assessment & Plan Note (Signed)
There he did extremely well after his epidural, the most recent of which was a L5-S1 interlaminar provided fantastic relief of his radicular pain, he has had 2 epidurals thus far, and at this point he is seeking a surgical opinion. I am going to refer him downstairs to [facility].

## 2014-11-19 NOTE — Therapy (Signed)
[facility] [place], [place], [address] Phone: [phone]   Fax:  [phone]  Physical Therapy Treatment  Patient Details  Name: Jerry Shannon MRN: [id number] Date of Birth: Feb 02, 1971 Referring Provider:  Dorna Leitz, MD  Encounter Date: 11/19/2014      PT End of Session - 11/19/14 0934    Visit Number 3   Number of Visits 10   Date for PT Re-Evaluation 12/06/14   PT Start Time 0935   PT Stop Time 1011   PT Time Calculation (min) 36 min   Activity Tolerance Patient tolerated treatment well      Past Medical History  Diagnosis Date  . Back injury   . History of hiatal hernia   . GERD (gastroesophageal reflux disease)     Past Surgical History  Procedure Laterality Date  . Hernia repair    . Knee surgery    . Knee arthroscopy with lateral menisectomy Left 11/07/2014    Procedure: Knee arthroscopy with lateral menisectomy, with lateral plica;  Surgeon: Dorna Leitz, MD;  Location: [facility];  Service: Orthopedics;  Laterality: Left;  . Chondroplasty Left 11/07/2014    Procedure: CHONDROPLASTY;  Surgeon: Dorna Leitz, MD;  Location: [facility];  Service: Orthopedics;  Laterality: Left;    There were no vitals filed for this visit.  Visit Diagnosis:  Stiffness of knee joint, left  Weakness of left leg  Abnormality of gait  Pain in knee joint, left      Subjective Assessment - 11/19/14 0935    Subjective Pt reports no knee pain other than occasional twinges.  HEP going well.    Currently in Pain? No/denies            [facility] PT Assessment - 11/19/14 0001    Assessment   Medical Diagnosis Lt knee scope   Onset Date/Surgical Date 11/07/14   Next MD Visit 12/06/14   AROM   Left Knee Extension -2   Left Knee Flexion 135          [facility] Adult PT Treatment/Exercise - 11/19/14 0001    Knee/Hip Exercises: Stretches   Passive Hamstring Stretch Left;2 reps;60  seconds  with strap    Quad Stretch Left;60 seconds;2 reps  with strap    Knee/Hip Exercises: Aerobic   Stationary Bike L1 - 5 min ROM   Knee/Hip Exercises: Supine   Bridges with Ball Squeeze Strengthening;Both;1 set;10 reps  5 sec hold in ext.    Straight Leg Raise with External Rotation --  1 rep; pt stopped due to "extreme" pain   Knee Extension PROM;Left  5 reps of overpressure into ext    Other Supine Knee/Hip Exercises SLR with Hip flexion then abd/add x 10 reps (trialed in long sitting; poor form)    Knee/Hip Exercises: Prone   Prone Knee Hang 2 minutes   Modalities   Modalities Electrical Stimulation   Electrical Stimulation   Electrical Stimulation Location Lt knee    Electrical Stimulation Action ion repelling    Electrical Stimulation Parameters to tolerance x 10   Electrical Stimulation Goals Edema   Vasopneumatic   Number Minutes Vasopneumatic  10 minutes   Vasopnuematic Location  Knee   Vasopneumatic Pressure Medium   Vasopneumatic Temperature  3*           PT Long Term Goals - 11/19/14 1112    PT LONG TERM GOAL #1   Title I with HEP ( 12/06/14)  Time 3   Period Weeks   Status On-going   PT LONG TERM GOAL #2   Title demo Lt knee flexion with in 5 degrees of Rt ( 12/06/14)    Time 3   Period Weeks   Status Achieved   PT LONG TERM GOAL #3   Title report pain decrease =/> 75% with daily acitivities ( 12/06/14)    Time 3   Period Weeks   Status On-going   PT LONG TERM GOAL #4   Title increase LE strength =/> 5-/5 to allow return to function and work ( 12/06/14)    Time 3   Period Weeks   PT LONG TERM GOAL #5   Title improve FOTO =/< 42% limited  ( 12/06/14)   Time 3   Period Weeks   Status On-going   PT LONG TERM GOAL #6   Title climb a ladder without difficulty or pain (12/06/14)   Time 3   Period Weeks   Status On-going               Plan - 11/19/14 1105    Clinical Impression Statement Pt demo increase in Lt knee flexion ROM;  continues with decreased Lt knee extension.  Pt unable to tolerate SLR with external rotation due to increased pain in Lt lateral knee with this motion.  Pt has met LTG #2. Treatment time limited due to appointment following therapy.    Pt will benefit from skilled therapeutic intervention in order to improve on the following deficits Abnormal gait;Decreased range of motion;Difficulty walking;Pain;Decreased strength;Increased edema   Rehab Potential Excellent   PT Frequency 2x / week   PT Duration 3 weeks   PT Treatment/Interventions Cryotherapy;Electrical Stimulation;Moist Heat;Therapeutic exercise;Manual techniques;Vasopneumatic Device;Patient/family education;Ultrasound;Traction   PT Next Visit Plan progress strengthening; HEP   PT Home Exercise Plan Pt to add light cardio (recumbant bike, walking) to HEP.    Consulted and Agree with Plan of Care Patient        Problem List Patient Active Problem List   Diagnosis Date Noted  . Excessive daytime sleepiness 11/06/2014  . Primary osteoarthritis of left knee with lateral meniscal tear postarthroscopy 09/21/2014  . Insomnia 09/21/2014  . Erectile dysfunction 09/21/2014  . Tinea cruris 10/20/2013  . Hyperlipidemia LDL goal < 100 04/29/2012  . Left lumbar radiculopathy 04/29/2012  . Hypogonadism male 04/29/2012  . Fatigue 04/01/2012  . Radiculitis of left cervical region 04/01/2012  . Chronic diarrhea 04/01/2012  . History of nasal fracture 04/01/2012   Kerin Perna, PTA 11/19/2014 11:14 AM  [place], [place], [address] Phone: [phone]   Fax:  [phone]

## 2014-11-19 NOTE — Telephone Encounter (Signed)
-----   Message from Monica Becton, MD sent at 11/19/2014 10:43 AM EDT ----- Tor Netters shakes, X-ray and MRI confirmed osteoarthritis postarthroscopy, now a candidate for Visco supplementation, lets get him set up for [facility], left knee. ___________________________________________ [name]. Benjamin Stain, M.D., ABFM., CAQSM. Primary Care and Sports Medicine [place] [facility]  Adjunct Instructor of Family Medicine  [employer]

## 2014-11-20 NOTE — Telephone Encounter (Signed)
Received information back from [facility]:  PATIENT HAS [employer] EXCHANGE HMO PLAN WITH AN EFFECTIVE DATE OF 05/08/2014. SUBMIT PCP REFERRAL FOR INITIAL VISIT WITH CLAIM. MEDICAL NOTES MUST BE SUBMITTED WITH THE CLAIM. INCLUDE MEDICAL NECESSITY, DIAGNOSIS, AND ALL CLINICAL. DOCTOR MAY NOT BUY AND BILL, MEDICATION MUST BE OBTAINED THROUGH THE SPECIALTY PHARMACY. PLEASE CONTACT THE ORTHOVISC LINE IF YOU WOULD LIKE MEDICATION TRANSFERRED TO SPECIALTY PHARMACY. [id number] IS COVERED AT 70% OF THE CONTRACTED RATE WHEN PERFORMED IN AN OFFICE SETTING. *DEDUCTIBLE MUST BE MET FOR COVERAGE TO APPLY. IF OUT OF POCKET IS MET, COVERAGE GOES TO 100%. Reference: [id number]  [facility] has received the Rx request and is processing it. Will contact our office with any PA requirements.  Medical notes have been faxed to [employer] at F: [phone]. Will await pre-determination.

## 2014-11-21 ENCOUNTER — Ambulatory Visit (INDEPENDENT_AMBULATORY_CARE_PROVIDER_SITE_OTHER): Payer: 59 | Admitting: Physical Therapy

## 2014-11-21 DIAGNOSIS — R269 Unspecified abnormalities of gait and mobility: Secondary | ICD-10-CM

## 2014-11-21 DIAGNOSIS — R29898 Other symptoms and signs involving the musculoskeletal system: Secondary | ICD-10-CM | POA: Diagnosis not present

## 2014-11-21 DIAGNOSIS — M25662 Stiffness of left knee, not elsewhere classified: Secondary | ICD-10-CM | POA: Diagnosis not present

## 2014-11-21 DIAGNOSIS — M25562 Pain in left knee: Secondary | ICD-10-CM

## 2014-11-21 NOTE — Therapy (Signed)
[facility] [place] [address] [place] [address] [place], [place], [address] Phone: [phone]   Fax:  [phone]  Physical Therapy Treatment  Patient Details  Name: Jerry Shannon MRN: [id number] Date of Birth: 07/24/1970 Referring Provider:  Jodi Geralds, MD  Encounter Date: 11/21/2014      PT End of Session - 11/21/14 0927    Visit Number 4   Number of Visits 10   Date for PT Re-Evaluation 12/06/14   PT Start Time 0847   PT Stop Time 0940   PT Time Calculation (min) 53 min   Activity Tolerance Patient limited by pain      Past Medical History  Diagnosis Date  . Back injury   . History of hiatal hernia   . GERD (gastroesophageal reflux disease)     Past Surgical History  Procedure Laterality Date  . Hernia repair    . Knee surgery    . Knee arthroscopy with lateral menisectomy Left 11/07/2014    Procedure: Knee arthroscopy with lateral menisectomy, with lateral plica;  Surgeon: Jodi Geralds, MD;  Location: [facility];  Service: Orthopedics;  Laterality: Left;  . Chondroplasty Left 11/07/2014    Procedure: CHONDROPLASTY;  Surgeon: Jodi Geralds, MD;  Location: [facility];  Service: Orthopedics;  Laterality: Left;    There were no vitals filed for this visit.  Visit Diagnosis:  Stiffness of knee joint, left  Weakness of left leg  Abnormality of gait  Pain in knee joint, left      Subjective Assessment - 11/21/14 0850    Subjective Pt reported he notices he has swelling in evenings after being on feet yesterday.  Continues to ice and elevate in PM.  "I look like I have a normal knee in the mornings"    Currently in Pain? No/denies            [facility] PT Assessment - 11/21/14 0001    Assessment   Medical Diagnosis Lt knee scope   Onset Date/Surgical Date 11/07/14   Next MD Visit 12/06/14           [facility] Adult PT Treatment/Exercise - 11/21/14 0001    Knee/Hip Exercises: Stretches   Passive  Hamstring Stretch Left;2 reps;60 seconds  with strap    Quad Stretch Left;60 seconds;2 reps  assisted by PTA, due to back spasm with use of strap   Gastroc Stretch Both;2 reps;30 seconds   Soleus Stretch Both;2 reps;20 seconds   Knee/Hip Exercises: Aerobic   Stationary Bike L3: 5 min (1.3 miles)   Knee/Hip Exercises: Standing   Heel Raises Both;10 reps;3 sets   Heel Raises Limitations (toes straight, in, out)   SLS Lt SLS x 30 sec, with horizontal head turns (challenging) x 2 reps;  x 15 sec on blue pad (c/o sharp pain at surgical site);   forward leans to chair seat x 5 reps (very challenging)   Knee/Hip Exercises: Supine   Straight Leg Raises Strengthening;Left;2 sets;10 reps   Knee/Hip Exercises: Sidelying   Hip ADduction 2 sets;10 reps;Left  RLE on chair.    Hip ADduction Limitations pt had to stop due to LB cramps.    Knee/Hip Exercises: Prone   Hamstring Curl 2 sets;10 reps  (2nd set with 1.5# wt on ankle)   Prone Knee Hang 1 minute  2 reps, 2nd rep with 1.5# weight   Modalities   Modalities Electrical Stimulation;Vasopneumatic;Cryotherapy   Cryotherapy   Number Minutes Cryotherapy 15 Minutes   Cryotherapy Location  Lumbar Spine   Type of Cryotherapy Ice pack   Electrical Stimulation   Electrical Stimulation Location Lt knee    Electrical Stimulation Action IFC   Electrical Stimulation Parameters to tolerance, 15 min    Electrical Stimulation Goals Pain   Vasopneumatic   Number Minutes Vasopneumatic  15 minutes   Vasopnuematic Location  Knee   Vasopneumatic Pressure Medium   Vasopneumatic Temperature  3*           PT Long Term Goals - 11/19/14 1112    PT LONG TERM GOAL #1   Title I with HEP ( 12/06/14)   Time 3   Period Weeks   Status On-going   PT LONG TERM GOAL #2   Title demo Lt knee flexion with in 5 degrees of Rt ( 12/06/14)    Time 3   Period Weeks   Status Achieved   PT LONG TERM GOAL #3   Title report pain decrease =/> 75% with daily acitivities (  12/06/14)    Time 3   Period Weeks   Status On-going   PT LONG TERM GOAL #4   Title increase LE strength =/> 5-/5 to allow return to function and work ( 12/06/14)    Time 3   Period Weeks   PT LONG TERM GOAL #5   Title improve FOTO =/< 42% limited  ( 12/06/14)   Time 3   Period Weeks   Status On-going   PT LONG TERM GOAL #6   Title climb a ladder without difficulty or pain (12/06/14)   Time 3   Period Weeks   Status On-going               Plan - 11/21/14 0919    Clinical Impression Statement Pt tolerated increased resistance with bicycle, but afterward reported increased Lt knee pain.  Pt also began having back spasms with SLS activities.  Pt reported decrease in pain with use of ice on LB and use of estim/vaso on Lt knee.  Making good progress towards goals.    Pt will benefit from skilled therapeutic intervention in order to improve on the following deficits Abnormal gait;Decreased range of motion;Difficulty walking;Pain;Decreased strength;Increased edema   Rehab Potential Excellent   PT Frequency 2x / week   PT Duration 3 weeks   PT Treatment/Interventions Cryotherapy;Electrical Stimulation;Moist Heat;Therapeutic exercise;Manual techniques;Vasopneumatic Device;Patient/family education;Ultrasound;Traction   PT Next Visit Plan progress strengthening; HEP   PT Home Exercise Plan Pt to add light cardio (recumbant bike, walking) to HEP.    Consulted and Agree with Plan of Care Patient        Problem List Patient Active Problem List   Diagnosis Date Noted  . Excessive daytime sleepiness 11/06/2014  . Primary osteoarthritis of left knee with lateral meniscal tear postarthroscopy 09/21/2014  . Insomnia 09/21/2014  . Erectile dysfunction 09/21/2014  . Tinea cruris 10/20/2013  . Hyperlipidemia LDL goal < 100 04/29/2012  . Left lumbar radiculopathy 04/29/2012  . Hypogonadism male 04/29/2012  . Fatigue 04/01/2012  . Radiculitis of left cervical region 04/01/2012  . Chronic  diarrhea 04/01/2012  . History of nasal fracture 04/01/2012   Mayer Camel, PTA 11/21/2014 9:28 AM  [facility] [place] [address] [place], [place], [address] Phone: [phone]   Fax:  [phone]

## 2014-11-23 ENCOUNTER — Encounter: Payer: 59 | Admitting: Physical Therapy

## 2014-11-26 ENCOUNTER — Encounter: Payer: 59 | Admitting: Physical Therapy

## 2014-11-27 ENCOUNTER — Ambulatory Visit (INDEPENDENT_AMBULATORY_CARE_PROVIDER_SITE_OTHER): Payer: 59 | Admitting: Physical Therapy

## 2014-11-27 DIAGNOSIS — M25662 Stiffness of left knee, not elsewhere classified: Secondary | ICD-10-CM | POA: Diagnosis not present

## 2014-11-27 DIAGNOSIS — M25562 Pain in left knee: Secondary | ICD-10-CM

## 2014-11-27 DIAGNOSIS — R269 Unspecified abnormalities of gait and mobility: Secondary | ICD-10-CM

## 2014-11-27 DIAGNOSIS — R29898 Other symptoms and signs involving the musculoskeletal system: Secondary | ICD-10-CM | POA: Diagnosis not present

## 2014-11-27 NOTE — Therapy (Signed)
[facility] [place] [address] [place] [address] [place], [place], [address] Phone: [phone]   Fax:  [phone]  Physical Therapy Treatment  Patient Details  Name: Jerry Shannon MRN: [id number] Date of Birth: [birth year] Referring Provider:  Jodi Geralds, MD  Encounter Date: 11/27/2014      PT End of Session - 11/27/14 0804    Visit Number 5   Number of Visits 10   Date for PT Re-Evaluation 12/06/14   PT Start Time 0802   PT Stop Time 0848   PT Time Calculation (min) 46 min      Past Medical History  Diagnosis Date  . Back injury   . History of hiatal hernia   . GERD (gastroesophageal reflux disease)     Past Surgical History  Procedure Laterality Date  . Hernia repair    . Knee surgery    . Knee arthroscopy with lateral menisectomy Left 11/07/2014    Procedure: Knee arthroscopy with lateral menisectomy, with lateral plica;  Surgeon: Jodi Geralds, MD;  Location: [facility];  Service: Orthopedics;  Laterality: Left;  . Chondroplasty Left 11/07/2014    Procedure: CHONDROPLASTY;  Surgeon: Jodi Geralds, MD;  Location: [facility];  Service: Orthopedics;  Laterality: Left;    There were no vitals filed for this visit.  Visit Diagnosis:  Stiffness of knee joint, left  Weakness of left leg  Abnormality of gait  Pain in knee joint, left      Subjective Assessment - 11/27/14 0802    Subjective Pt reports he feels better now than he did last week. His pain is down, motion feels more loose. He did minimal exercise this weekend, only stretches.  Has to leave by 9 am has neurosurgeon appointment at 9:30   Currently in Pain? Yes   Pain Score 7    Pain Location Knee   Pain Orientation Left;Lateral   Pain Descriptors / Indicators Stabbing   Pain Type Surgical pain   Aggravating Factors  increased activity and being up on the leg   Pain Relieving Factors rest and ice.             [facility] PT Assessment -  11/27/14 0001    Assessment   Medical Diagnosis Lt knee scope   Onset Date/Surgical Date 11/07/14   Next MD Visit 12/06/14   AROM   Left Knee Extension -1   Left Knee Flexion 140                     [facility] Adult PT Treatment/Exercise - 11/27/14 0001    Knee/Hip Exercises: Stretches   Passive Hamstring Stretch Left;30 seconds  with strap then with knee presses   Other Knee/Hip Stretches Cross body stretch with strap Lt LE   Knee/Hip Exercises: Aerobic   Stationary Bike L2x5'   Knee/Hip Exercises: Standing   SLS Lt with FWD leans   Rebounder Lt SLS, then on blue therapad   Modalities   Modalities Office manager Location Lt knee    Electrical Stimulation Action ion repelling   Electrical Stimulation Parameters to tolerance   Electrical Stimulation Goals Edema;Pain   Vasopneumatic   Number Minutes Vasopneumatic  15 minutes   Vasopnuematic Location  Knee   Vasopneumatic Pressure Medium   Vasopneumatic Temperature  3*                     PT Long Term Goals -  11/27/14 0811    PT LONG TERM GOAL #1   Title I with HEP ( 12/06/14)   Status On-going   PT LONG TERM GOAL #2   Title demo Lt knee flexion with in 5 degrees of Rt ( 12/06/14)    Status Achieved   PT LONG TERM GOAL #3   Title report pain decrease =/> 75% with daily acitivities ( 12/06/14)    Status On-going   PT LONG TERM GOAL #4   Title increase LE strength =/> 5-/5 to allow return to function and work ( 12/06/14)    Status On-going   PT LONG TERM GOAL #5   Title improve FOTO =/< 42% limited  ( 12/06/14)   Status On-going   PT LONG TERM GOAL #6   Title climb a ladder without difficulty or pain (12/06/14)   Status On-going               Plan - 11/27/14 9604    Clinical Impression Statement Patients pain is slowly decreasing, he continues to have defecits in higher level proprioception and strength.  Back is also a  limiting factor with some of the activities he has tried.    Pt will benefit from skilled therapeutic intervention in order to improve on the following deficits Abnormal gait;Decreased range of motion;Difficulty walking;Pain;Decreased strength;Increased edema   Rehab Potential Excellent   PT Frequency 2x / week   PT Duration 3 weeks   PT Treatment/Interventions Cryotherapy;Electrical Stimulation;Moist Heat;Therapeutic exercise;Manual techniques;Vasopneumatic Device;Patient/family education;Ultrasound;Traction   PT Next Visit Plan progress strengthening; HEP   PT Home Exercise Plan Pt to begin strengthening for upper body, recommend he start more walking.    Consulted and Agree with Plan of Care Patient        Problem List Patient Active Problem List   Diagnosis Date Noted  . Excessive daytime sleepiness 11/06/2014  . Primary osteoarthritis of left knee with lateral meniscal tear postarthroscopy 09/21/2014  . Insomnia 09/21/2014  . Erectile dysfunction 09/21/2014  . Tinea cruris 10/20/2013  . Hyperlipidemia LDL goal < 100 04/29/2012  . Left lumbar radiculopathy 04/29/2012  . Hypogonadism male 04/29/2012  . Fatigue 04/01/2012  . Radiculitis of left cervical region 04/01/2012  . Chronic diarrhea 04/01/2012  . History of nasal fracture 04/01/2012    Roderic Scarce PT  11/27/2014, 8:31 AM  [facility] [address] [place] [address] [place], [place], [address] Phone: [phone]   Fax:  [phone]

## 2014-11-28 ENCOUNTER — Ambulatory Visit (HOSPITAL_BASED_OUTPATIENT_CLINIC_OR_DEPARTMENT_OTHER): Payer: 59

## 2014-11-28 ENCOUNTER — Ambulatory Visit (INDEPENDENT_AMBULATORY_CARE_PROVIDER_SITE_OTHER): Payer: 59 | Admitting: Physical Therapy

## 2014-11-28 DIAGNOSIS — M25662 Stiffness of left knee, not elsewhere classified: Secondary | ICD-10-CM | POA: Diagnosis not present

## 2014-11-28 DIAGNOSIS — M25562 Pain in left knee: Secondary | ICD-10-CM

## 2014-11-28 DIAGNOSIS — R269 Unspecified abnormalities of gait and mobility: Secondary | ICD-10-CM | POA: Diagnosis not present

## 2014-11-28 DIAGNOSIS — R29898 Other symptoms and signs involving the musculoskeletal system: Secondary | ICD-10-CM

## 2014-11-28 NOTE — Therapy (Signed)
[facility] [place] [address] [place] [address] [place], [place], [address] Phone: [phone]   Fax:  [phone]  Physical Therapy Treatment  Patient Details  Name: Jerry Shannon MRN: [id number] Date of Birth: 12-14-1970 Referring Provider:  Jodi Geralds, MD  Encounter Date: 11/28/2014      PT End of Session - 11/28/14 0840    Visit Number 6   Number of Visits 10   Date for PT Re-Evaluation 12/06/14   PT Start Time 0839   PT Stop Time 0935   PT Time Calculation (min) 56 min      Past Medical History  Diagnosis Date  . Back injury   . History of hiatal hernia   . GERD (gastroesophageal reflux disease)     Past Surgical History  Procedure Laterality Date  . Hernia repair    . Knee surgery    . Knee arthroscopy with lateral menisectomy Left 11/07/2014    Procedure: Knee arthroscopy with lateral menisectomy, with lateral plica;  Surgeon: Jodi Geralds, MD;  Location: [facility];  Service: Orthopedics;  Laterality: Left;  . Chondroplasty Left 11/07/2014    Procedure: CHONDROPLASTY;  Surgeon: Jodi Geralds, MD;  Location: [facility];  Service: Orthopedics;  Laterality: Left;    There were no vitals filed for this visit.  Visit Diagnosis:  Stiffness of knee joint, left  Weakness of left leg  Abnormality of gait  Pain in knee joint, left      Subjective Assessment - 11/28/14 0840    Subjective Pt reports he is sore from therapy yesterday followed by cleaning his garage.  "I was busy from 6am-4pm yesterday"    Currently in Pain? Yes   Pain Location Leg   Pain Orientation Left   Pain Descriptors / Indicators Sore            [facility] PT Assessment - 11/28/14 0001    Assessment   Medical Diagnosis Lt knee scope   Onset Date/Surgical Date 11/07/14   Next MD Visit 12/06/14         [facility] Adult PT Treatment/Exercise - 11/28/14 0001    Knee/Hip Exercises: Stretches   Passive Hamstring Stretch Left;2  reps;30 seconds;Right  with strap and Rt knee bent   Quad Stretch Both;2 reps;30 seconds  2 sets   Piriformis Stretch Right;Left;2 reps;30 seconds   Other Knee/Hip Stretches Cross body stretch with strap Lt LE 30 sec x 2 reps   2 sets    Knee/Hip Exercises: Aerobic   Stationary Bike L2: 5 min   Knee/Hip Exercises: Standing   Step Down 1 set;10 reps;Left;Hand Hold: 1;Step Height: 2"  Heel taps    Step Down Limitations 6" step too difficult- switched to 3"    Wall Squat 10 reps;5 seconds  with ball squeeze    Wall Squat Limitations pt c/o stabbing pain in Lt lateral knee when attempting to squat too low.    SLS Lt SLS on blue pad x 30 sec x 2 reps; with ball toss x 3 trials.  Lt forward leans to chair seat  x 12 reps    Other Standing Knee Exercises Step ups on 13" stool ( to simulate ladder climb) x 5 reps; pt reported sharp pain in lateral Lt knee with descent.    Modalities   Modalities Personnel officer Action IFC   Electrical Stimulation Parameters to tolerance    Electrical  Stimulation Goals Pain   Vasopneumatic   Number Minutes Vasopneumatic  15 minutes   Vasopnuematic Location  Knee   Vasopneumatic Pressure Medium   Vasopneumatic Temperature  3*            PT Long Term Goals - 11/27/14 4098    PT LONG TERM GOAL #1   Title I with HEP ( 12/06/14)   Status On-going   PT LONG TERM GOAL #2   Title demo Lt knee flexion with in 5 degrees of Rt ( 12/06/14)    Status Achieved   PT LONG TERM GOAL #3   Title report pain decrease =/> 75% with daily acitivities ( 12/06/14)    Status On-going   PT LONG TERM GOAL #4   Title increase LE strength =/> 5-/5 to allow return to function and work ( 12/06/14)    Status On-going   PT LONG TERM GOAL #5   Title improve FOTO =/< 42% limited  ( 12/06/14)   Status On-going   PT LONG TERM GOAL #6   Title climb a ladder without  difficulty or pain (12/06/14)   Status On-going               Plan - 11/28/14 0921    Clinical Impression Statement Pt tolerated most exercises well, but reported some "stabbing pain" with eccentric quad exercises; decreased with stretches.  Pt reported decrease in pain with use of vaso and estim. Progressing towards goals .   Pt will benefit from skilled therapeutic intervention in order to improve on the following deficits Abnormal gait;Decreased range of motion;Difficulty walking;Pain;Decreased strength;Increased edema   Rehab Potential Excellent   PT Frequency 2x / week   PT Duration 3 weeks   PT Treatment/Interventions Cryotherapy;Electrical Stimulation;Moist Heat;Therapeutic exercise;Manual techniques;Vasopneumatic Device;Patient/family education;Ultrasound;Traction   PT Next Visit Plan progress strengthening; HEP   Consulted and Agree with Plan of Care Patient        Problem List Patient Active Problem List   Diagnosis Date Noted  . Excessive daytime sleepiness 11/06/2014  . Primary osteoarthritis of left knee with lateral meniscal tear postarthroscopy 09/21/2014  . Insomnia 09/21/2014  . Erectile dysfunction 09/21/2014  . Tinea cruris 10/20/2013  . Hyperlipidemia LDL goal < 100 04/29/2012  . Left lumbar radiculopathy 04/29/2012  . Hypogonadism male 04/29/2012  . Fatigue 04/01/2012  . Radiculitis of left cervical region 04/01/2012  . Chronic diarrhea 04/01/2012  . History of nasal fracture 04/01/2012    Salvadore Oxford 11/28/2014, 9:25 AM  [facility] [address] [place] [address] [place], [place], [address] Phone: [phone]   Fax:  [phone]

## 2014-11-29 ENCOUNTER — Ambulatory Visit: Payer: 59 | Admitting: Sports Medicine

## 2014-11-29 ENCOUNTER — Telehealth: Payer: Self-pay

## 2014-11-29 DIAGNOSIS — M5416 Radiculopathy, lumbar region: Secondary | ICD-10-CM

## 2014-11-29 NOTE — Telephone Encounter (Signed)
Patient called stated that he was seeing a chiropractor and the referral he had expired so he is  requesting a referral for chiropractor Gloriann Loan  [place], phone # [phone] located in [place].Bjorn Loser Cunningham,CMA

## 2014-11-29 NOTE — Telephone Encounter (Addendum)
Received fax from [facility] requesting CPT codes. The following codes will be associated with each injection administration (Left knee only = 4 total).  CPT code: 40981 Rx code: X9147 DX: M17.12  Will fax information to [facility].

## 2014-11-30 NOTE — Telephone Encounter (Signed)
Referral placed.

## 2014-11-30 NOTE — Telephone Encounter (Signed)
SPOKE TO PATIENT ADVISED HIM THAT REFERRAL HAS BEEN PLACED. Rhonda Cunningham,CMA

## 2014-12-05 ENCOUNTER — Ambulatory Visit (INDEPENDENT_AMBULATORY_CARE_PROVIDER_SITE_OTHER): Payer: 59 | Admitting: Physical Therapy

## 2014-12-05 DIAGNOSIS — M25662 Stiffness of left knee, not elsewhere classified: Secondary | ICD-10-CM | POA: Diagnosis not present

## 2014-12-05 DIAGNOSIS — R29898 Other symptoms and signs involving the musculoskeletal system: Secondary | ICD-10-CM

## 2014-12-05 DIAGNOSIS — M25562 Pain in left knee: Secondary | ICD-10-CM

## 2014-12-05 NOTE — Therapy (Addendum)
[place], [place], [address] Phone: [phone]   Fax:  [phone]  Physical Therapy Treatment  Patient Details  Name: Jerry Shannon MRN: [id number] Date of Birth: 1970/12/30 Referring Provider:  Dorna Leitz, MD  Encounter Date: 12/05/2014      PT End of Session - 12/05/14 0938    Visit Number 7   Number of Visits 10   Date for PT Re-Evaluation 12/06/14   PT Start Time 0932   PT Stop Time 1015   PT Time Calculation (min) 43 min   Activity Tolerance Patient limited by pain      Past Medical History  Diagnosis Date  . Back injury   . History of hiatal hernia   . GERD (gastroesophageal reflux disease)     Past Surgical History  Procedure Laterality Date  . Hernia repair    . Knee surgery    . Knee arthroscopy with lateral menisectomy Left 11/07/2014    Procedure: Knee arthroscopy with lateral menisectomy, with lateral plica;  Surgeon: Dorna Leitz, MD;  Location: [place];  Service: Orthopedics;  Laterality: Left;  . Chondroplasty Left 11/07/2014    Procedure: CHONDROPLASTY;  Surgeon: Dorna Leitz, MD;  Location: [place];  Service: Orthopedics;  Laterality: Left;    There were no vitals filed for this visit.  Visit Diagnosis:  Stiffness of knee joint, left  Weakness of left leg  Pain in knee joint, left      Subjective Assessment - 12/05/14 0942    Subjective Pt reports his back is feeling better. Now pain is localized to lateral side of Lt knee at distal ITB. HEP going ok, but resting still seems to make it feel better.    Currently in Pain? Yes   Pain Score 4    Pain Location Knee   Pain Orientation Left   Pain Descriptors / Indicators Dull  sharp with movement.    Aggravating Factors  bending and twisting    Pain Relieving Factors sitting             [facility] PT Assessment - 12/05/14 0001    Assessment   Medical Diagnosis Lt knee scope    Onset Date/Surgical Date 11/07/14   Next MD Visit 12/06/14   AROM   AROM Assessment Site Knee   Right/Left Knee Left   Left Knee Extension 0   Left Knee Flexion 140   Strength   Strength Assessment Site Hip;Knee   Right/Left Hip Left   Left Hip Flexion 5/5   Left Hip Extension 5/5   Left Hip ABduction --  5-/5   Right/Left Knee Left   Left Knee Flexion 5/5          [facility] Adult PT Treatment/Exercise - 12/05/14 0001    Knee/Hip Exercises: Stretches   Passive Hamstring Stretch Right;Left;2 reps;60 seconds   Quad Stretch Right;Left;2 reps;30 seconds   Gastroc Stretch Right;Left;2 reps;30 seconds   Soleus Stretch Right;Left;2 reps;30 seconds   Other Knee/Hip Stretches Cross body stretch with strap Lt LE 30 sec x 2 reps   2 sets    Knee/Hip Exercises: Aerobic   Stationary Bike L2: 6 min   Knee/Hip Exercises: Standing   Heel Raises Both;2 sets;10 reps   Wall Squat 1 set;10 reps;3 seconds   Wall Squat Limitations pt c/o stabbing pain in Lt lateral knee when attempting to squat too low. limited flexion to 60-70 deg.    Modalities   Modalities --  pt declined   Manual Therapy   Manual Therapy Other (comment)   Other Manual Therapy Instrument assisted massage to Lt lateral quad/ hamstring and ITB, Lt lateral fat pad, to decrease fascial restictions and pain.             PT Long Term Goals - 12/05/14 1141    PT LONG TERM GOAL #1   Title I with HEP ( 12/06/14)   Time 3   Period Weeks   Status On-going   PT LONG TERM GOAL #2   Title demo Lt knee flexion with in 5 degrees of Rt ( 12/06/14)    Time 3   Period Weeks   Status Achieved   PT LONG TERM GOAL #3   Title report pain decrease =/> 75% with daily acitivities ( 12/06/14)    Period Weeks   Status On-going  reports 60% improvement   PT LONG TERM GOAL #4   Title increase LE strength =/> 5-/5 to allow return to function and work ( 12/06/14)    Time 3   Period Weeks   Status Achieved   PT LONG TERM GOAL #5   Title  improve FOTO =/< 42% limited  ( 12/06/14)   Time 3   Period Weeks   Status Not Met  scored 56% limited   PT LONG TERM GOAL #6   Title climb a ladder without difficulty or pain (12/06/14)   Time 3   Period Weeks   Status On-going  (unable to tolerate)               Plan - 12/05/14 1135    Clinical Impression Statement Pt has demo improved ROM and MMT strength. Pt continues to report some "stabbing" pain and demonstrates weakness with standing eccentric quad exercises.  Pt has partially met his goals. Pt may benefit from continued PT intervention to decrease pain and improve function.    Pt will benefit from skilled therapeutic intervention in order to improve on the following deficits Abnormal gait;Decreased range of motion;Difficulty walking;Pain;Decreased strength;Increased edema   Rehab Potential Excellent   PT Frequency 2x / week   PT Duration 3 weeks   PT Treatment/Interventions Cryotherapy;Electrical Stimulation;Moist Heat;Therapeutic exercise;Manual techniques;Vasopneumatic Device;Patient/family education;Ultrasound;Traction   PT Next Visit Plan Pt returns to MD.  Will hold PT until assessment by MD. If needed will continue to progress Lt knee/hip strengthening as tolerated upon return.  Spoke to supervising PT regarding goals and progress.    Consulted and Agree with Plan of Care Patient        Problem List Patient Active Problem List   Diagnosis Date Noted  . Excessive daytime sleepiness 11/06/2014  . Primary osteoarthritis of left knee with lateral meniscal tear postarthroscopy 09/21/2014  . Insomnia 09/21/2014  . Erectile dysfunction 09/21/2014  . Tinea cruris 10/20/2013  . Hyperlipidemia LDL goal < 100 04/29/2012  . Left lumbar radiculopathy 04/29/2012  . Hypogonadism male 04/29/2012  . Fatigue 04/01/2012  . Radiculitis of left cervical region 04/01/2012  . Chronic diarrhea 04/01/2012  . History of nasal fracture 04/01/2012    Kerin Perna,  PTA 12/05/2014 11:45 AM  [place], [place], [address] Phone: [phone]   Fax:  [phone]     PHYSICAL THERAPY DISCHARGE SUMMARY  Visits from Start of Care: 7  Current functional level related to goals / functional outcomes: unknown   Remaining deficits: unknown   Education / Equipment: HEP Plan:  Patient goals were partially met. Patient is being discharged due to not returning since the last visit.  ?????       Jeral Pinch, PT 01/17/2015 4:59 PM

## 2014-12-07 ENCOUNTER — Ambulatory Visit (INDEPENDENT_AMBULATORY_CARE_PROVIDER_SITE_OTHER): Payer: 59 | Admitting: Sports Medicine

## 2014-12-07 ENCOUNTER — Telehealth: Payer: Self-pay | Admitting: Sports Medicine

## 2014-12-07 ENCOUNTER — Ambulatory Visit (INDEPENDENT_AMBULATORY_CARE_PROVIDER_SITE_OTHER): Payer: 59

## 2014-12-07 ENCOUNTER — Encounter: Payer: Self-pay | Admitting: Sports Medicine

## 2014-12-07 VITALS — BP 140/83 | HR 118 | Wt 197.0 lb

## 2014-12-07 DIAGNOSIS — M5416 Radiculopathy, lumbar region: Secondary | ICD-10-CM | POA: Diagnosis not present

## 2014-12-07 DIAGNOSIS — M1712 Unilateral primary osteoarthritis, left knee: Secondary | ICD-10-CM

## 2014-12-07 DIAGNOSIS — M1611 Unilateral primary osteoarthritis, right hip: Secondary | ICD-10-CM

## 2014-12-07 DIAGNOSIS — L739 Follicular disorder, unspecified: Secondary | ICD-10-CM

## 2014-12-07 DIAGNOSIS — Z96643 Presence of artificial hip joint, bilateral: Secondary | ICD-10-CM | POA: Insufficient documentation

## 2014-12-07 DIAGNOSIS — Z9889 Other specified postprocedural states: Secondary | ICD-10-CM | POA: Insufficient documentation

## 2014-12-07 MED ORDER — MUPIROCIN 2 % EX OINT: TOPICAL_OINTMENT | CUTANEOUS | Status: DC

## 2014-12-07 NOTE — Assessment & Plan Note (Signed)
We are still going to look into viscous supple mentation.

## 2014-12-07 NOTE — Telephone Encounter (Signed)
Jerry Shannon, will wait.

## 2014-12-07 NOTE — Assessment & Plan Note (Signed)
Adding topical mupirocin

## 2014-12-07 NOTE — Telephone Encounter (Signed)
Over 40 pages of medical notes were faxed to insurance for medical necessity review and authorization on 11/20/14. No update yet.

## 2014-12-07 NOTE — Progress Notes (Signed)
  Subjective:    CC:  Follow-up  HPI:  left knee osteoarthritis: with lateral meniscal tear, has done well post arthroscopy and is currently doing physical therapy.   lumbar radiculopathy: responded extremely well to that L5-S1 transforaminal epidural. He is not a surgical candidate after visit with neurosurgery, continues to do well with the epidural.   Right groin pain: Localize, with radiation down into the anterior thigh and worse with weightbearing.  Past medical history, Surgical history, Family history not pertinant except as noted below, Social history, Allergies, and medications have been entered into the medical record, reviewed, and no changes needed.   Review of Systems: No fevers, chills, night sweats, weight loss, chest pain, or shortness of breath.   Objective:    General: Well Developed, well nourished, and in no acute distress.  Neuro: Alert and oriented x3, extra-ocular muscles intact, sensation grossly intact.  HEENT: Normocephalic, atraumatic, pupils equal round reactive to light, neck supple, no masses, no lymphadenopathy, thyroid nonpalpable.  Skin: Warm and dry, no rashes. Cardiac: Regular rate and rhythm, no murmurs rubs or gallops, no lower extremity edema.  Respiratory: Clear to auscultation bilaterally. Not using accessory muscles, speaking in full sentences. rightHip: ROM IR: 20 with severe pain, ER: 60 Deg, Flexion: 120 Deg, Extension: 100 Deg, Abduction: 45 Deg, Adduction: 45 Deg Strength IR: 5/5, ER: 5/5, Flexion: 5/5, Extension: 5/5, Abduction: 5/5, Adduction: 5/5 Pelvic alignment unremarkable to inspection and palpation. Standing hip rotation and gait without trendelenburg / unsteadiness. Greater trochanter without tenderness to palpation. No tenderness over piriformis. No SI joint tenderness and normal minimal SI movement.  Impression and Recommendations:

## 2014-12-07 NOTE — Assessment & Plan Note (Signed)
Hip x-rays.  Return for injection if desired. Groin pain is referable to the femoral acetabular joint

## 2014-12-07 NOTE — Telephone Encounter (Signed)
-----   Message from Monica Becton, MD sent at 12/07/2014 10:09 AM EDT ----- Did we ever look into OrthoVisc, left knee only? ___________________________________________ [name]. Benjamin Stain, M.D., ABFM., CAQSM. Primary Care and Sports Medicine [facility]  Adjunct Instructor of Family Medicine  [employer]

## 2014-12-13 NOTE — Telephone Encounter (Signed)
Received information from [facility] informing of approval for OV. They will contact Pt regarding payment then contact our office to determine shipping information. PCP and Pt advised (via voicemail) of approval.  

## 2014-12-13 NOTE — Telephone Encounter (Signed)
Received information from [facility] pharmacy informing of approval for OV. They will contact Pt regarding payment then contact our office to determine shipping information. PCP and Pt advised (via voicemail) of approval.

## 2014-12-26 ENCOUNTER — Ambulatory Visit (INDEPENDENT_AMBULATORY_CARE_PROVIDER_SITE_OTHER): Payer: 59 | Admitting: Sports Medicine

## 2014-12-26 ENCOUNTER — Encounter: Payer: Self-pay | Admitting: Sports Medicine

## 2014-12-26 VITALS — BP 141/102 | HR 118 | Wt 195.0 lb

## 2014-12-26 DIAGNOSIS — M1712 Unilateral primary osteoarthritis, left knee: Secondary | ICD-10-CM

## 2014-12-26 NOTE — Progress Notes (Signed)
  Subjective:    CC:  Needs paperwork filled out  HPI: This is a pleasant 44 year old male, he is post operative knee arthroscopy for lateral meniscal tear and doing well.  Past medical history, Surgical history, Family history not pertinant except as noted below, Social history, Allergies, and medications have been entered into the medical record, reviewed, and no changes needed.   Review of Systems: No fevers, chills, night sweats, weight loss, chest pain, or shortness of breath.   Objective:    General: Well Developed, well nourished, and in no acute distress.   Impression and Recommendations:    I spent 25 minutes with this patient, greater than 50% was face-to-face time counseling regarding the above diagnoses, this time was spent filling out paperwork and discussing plan of care.

## 2014-12-26 NOTE — Assessment & Plan Note (Signed)
Still awaiting response on Orthovisc, overall doing well, paperwork filled out.

## 2015-01-29 ENCOUNTER — Ambulatory Visit (INDEPENDENT_AMBULATORY_CARE_PROVIDER_SITE_OTHER): Payer: 59 | Admitting: Sports Medicine

## 2015-01-29 ENCOUNTER — Encounter: Payer: Self-pay | Admitting: Sports Medicine

## 2015-01-29 VITALS — BP 159/96 | HR 116 | Wt 199.0 lb

## 2015-01-29 DIAGNOSIS — M1611 Unilateral primary osteoarthritis, right hip: Secondary | ICD-10-CM | POA: Diagnosis not present

## 2015-01-29 NOTE — Assessment & Plan Note (Signed)
Fantastic response to radicular pain with lumbar epidural, now having anterior hip pain, femoral acetabular injection as above has relieved all of this. He needs to keep him going until the beginning of summer next year, at which point he could go through with a hip resurfacing.

## 2015-01-29 NOTE — Progress Notes (Signed)
  Subjective:    CC: Follow-up  HPI: Lumbar radiculopathy: Pain is resolved after epidural, posterior right leg radiculopathy is gone.  Right hip pain: Anterior, radiation down the anterior thigh, worse with weightbearing and after being still for a period of time. Severe, persistent.  Past medical history, Surgical history, Family history not pertinant except as noted below, Social history, Allergies, and medications have been entered into the medical record, reviewed, and no changes needed.   Review of Systems: No fevers, chills, night sweats, weight loss, chest pain, or shortness of breath.   Objective:    General: Well Developed, well nourished, and in no acute distress.  Neuro: Alert and oriented x3, extra-ocular muscles intact, sensation grossly intact.  HEENT: Normocephalic, atraumatic, pupils equal round reactive to light, neck supple, no masses, no lymphadenopathy, thyroid nonpalpable.  Skin: Warm and dry, no rashes. Cardiac: Regular rate and rhythm, no murmurs rubs or gallops, no lower extremity edema.  Respiratory: Clear to auscultation bilaterally. Not using accessory muscles, speaking in full sentences. Right Hip: ROM IR: 30 with severe pain, ER: 60 Deg, Flexion: 120 Deg, Extension: 100 Deg, Abduction: 45 Deg, Adduction: 45 Deg Strength IR: 5/5, ER: 5/5, Flexion: 5/5, Extension: 5/5, Abduction: 5/5, Adduction: 5/5 Pelvic alignment unremarkable to inspection and palpation. Greater trochanter without tenderness to palpation. No tenderness over piriformis. No SI joint tenderness and normal minimal SI movement.  Procedure: Real-time Ultrasound Guided Injection of right femoroacetabular joint Device: GE Logiq E  Verbal informed consent obtained.  Time-out conducted.  Noted no overlying erythema, induration, or other signs of local infection.  Skin prepped in a sterile fashion.  Local anesthesia: Topical Ethyl chloride.  With sterile technique and under real time  ultrasound guidance:  25-gauge needle advanced to the femoral head/neck junction, 2 mL kenalog 40, 2 mL lidocaine, 2 mL Marcaine injected easily. Completed without difficulty  Pain immediately resolved suggesting accurate placement of the medication.  Advised to call if fevers/chills, erythema, induration, drainage, or persistent bleeding.  Images permanently stored and available for review in the ultrasound unit.  Impression: Technically successful ultrasound guided injection.  Impression and Recommendations:

## 2015-02-19 ENCOUNTER — Telehealth: Payer: Self-pay | Admitting: Sports Medicine

## 2015-02-19 DIAGNOSIS — M1712 Unilateral primary osteoarthritis, left knee: Secondary | ICD-10-CM

## 2015-02-19 NOTE — Telephone Encounter (Signed)
Done

## 2015-02-19 NOTE — Telephone Encounter (Signed)
Patient needs a ref sent to PT here in our bldg. He has appt tomorrow and they have to have the ref in order to see him.  Can you please put the ref in so we can send it for him today?  Thanks so much and sorry for short notice

## 2015-02-20 ENCOUNTER — Ambulatory Visit: Payer: 59 | Admitting: Physical Therapy

## 2015-04-15 ENCOUNTER — Ambulatory Visit: Payer: Self-pay | Admitting: Sports Medicine

## 2015-04-15 ENCOUNTER — Emergency Department
Admission: EM | Admit: 2015-04-15 | Discharge: 2015-04-15 | Disposition: A | Discharge status: Home / Self Care | Payer: Managed Care, Other (non HMO) | Source: Home / Self Care | Attending: Emergency Medicine | Admitting: Emergency Medicine

## 2015-04-15 ENCOUNTER — Encounter: Payer: Self-pay | Admitting: Emergency Medicine

## 2015-04-15 DIAGNOSIS — J111 Influenza due to unidentified influenza virus with other respiratory manifestations: Secondary | ICD-10-CM | POA: Diagnosis not present

## 2015-04-15 DIAGNOSIS — J101 Influenza due to other identified influenza virus with other respiratory manifestations: Secondary | ICD-10-CM

## 2015-04-15 LAB — POCT INFLUENZA A/B
Influenza A, POC: POSITIVE — AB
Influenza B, POC: NEGATIVE

## 2015-04-15 MED ORDER — OSELTAMIVIR PHOSPHATE 75 MG PO CAPS: ORAL_CAPSULE | ORAL | Status: DC

## 2015-04-15 MED ORDER — PROMETHAZINE-CODEINE 6.25-10 MG/5ML PO SYRP: ORAL_SOLUTION | ORAL | Status: DC

## 2015-04-15 NOTE — ED Provider Notes (Signed)
CSN: [id number]     Arrival date & time 04/15/15  1131 History   First MD Initiated Contact with Patient 04/15/15 1136     Chief Complaint  Patient presents with  . Cough  . Generalized Body Aches  . Dizziness  . Nasal Congestion    HPI FLU  HPI : Flu symptoms for about 1 day. Abrupt onset yesterday: Fever to 102 with chills, sweats, myalgias, fatigue, headache. Symptoms are progressively worsening, despite trying OTC fever reducing medicine and rest and fluids. Has decreased appetite, but tolerating some liquids by mouth. No history of recent tick bite. +Hacking nonproductive cough that keeps him up at night. Denies shortness of breath or wheezing.  Review of Systems: Positive for fatigue, mild nasal congestion, mild sore throat, mild swollen anterior neck glands. Negative for acute vision changes, stiff neck, focal weakness, syncope, seizures, respiratory distress, vomiting, diarrhea, GU symptoms, new rash.  Past Medical History  Diagnosis Date  . Back injury   . History of hiatal hernia   . GERD (gastroesophageal reflux disease)    Past Surgical History  Procedure Laterality Date  . Hernia repair    . Knee surgery    . Knee arthroscopy with lateral menisectomy Left 11/07/2014    Procedure: Knee arthroscopy with lateral menisectomy, with lateral plica;  Surgeon: Jodi Geralds, MD;  Location: [facility];  Service: Orthopedics;  Laterality: Left;  . Chondroplasty Left 11/07/2014    Procedure: CHONDROPLASTY;  Surgeon: Jodi Geralds, MD;  Location: [facility];  Service: Orthopedics;  Laterality: Left;   Family history negative for chronic lung disease.  History reviewed. No pertinent family history. Social History  Substance Use Topics  . Smoking status: Never Smoker   . Smokeless tobacco: Current User    Types: Snuff  . Alcohol Use: Yes    Review of Systems  All other systems reviewed and are negative.  See also history of present  illness Allergies  Review of patient's allergies indicates no known allergies.  Home Medications   Prior to Admission medications   Medication Sig Start Date End Date Taking? Authorizing Provider  Multiple Vitamin (MULTIVITAMIN) capsule Take 1 capsule by mouth daily.    Historical Provider, MD  naproxen (NAPROSYN) 500 MG tablet Take 1 tablet (500 mg total) by mouth 2 (two) times daily with a meal. 06/19/13   Monica Becton, MD  oseltamivir (TAMIFLU) 75 MG capsule Starting today, take 1 capsule by mouth twice a day for 5 days. 04/15/15   Lajean Manes, MD  promethazine-codeine [facility] WITH CODEINE) 6.25-10 MG/5ML syrup Take 1-2 teaspoons every 4-6 hours as needed for cough. May cause drowsiness. 04/15/15   Lajean Manes, MD   Meds Ordered and Administered this Visit  Medications - No data to display  BP 143/88 mmHg  Pulse 117  Temp(Src) 99.3 F (37.4 C) (Oral)  Resp 18  Ht 5' 7.5" (1.715 m)  Wt 199 lb (90.266 kg)  BMI 30.69 kg/m2  SpO2 96% No data found.   Physical Exam  Constitutional: He appears well-developed and well-nourished.  Non-toxic appearance. He appears ill (very fatigued, but no cardiorespiratory distress). No distress.  HENT:  Head: Normocephalic and atraumatic.  Right Ear: Tympanic membrane and external ear normal.  Left Ear: Tympanic membrane and external ear normal.  Nose: Rhinorrhea present.  Mouth/Throat: Mucous membranes are normal. Posterior oropharyngeal erythema (mild redness ) present. No oropharyngeal exudate.  Eyes: Conjunctivae are normal. Right eye exhibits no discharge. Left eye exhibits no  discharge. No scleral icterus.  Neck: Neck supple.  Cardiovascular: Normal rate, regular rhythm and normal heart sounds.   Pulmonary/Chest: Breath sounds normal. No stridor. No respiratory distress. He has no wheezes. He has no rales.  Abdominal: Soft. There is no tenderness.  Musculoskeletal: He exhibits no edema.  Lymphadenopathy:    He has cervical  adenopathy (mild shoddy anterior cervical nodes).  Neurological: He is alert.  Skin: Skin is warm and intact. No rash noted. He is diaphoretic.  Psychiatric: He has a normal mood and affect.  Nursing note and vitals reviewed.   ED Course  Procedures (including critical care time)  Labs Review Labs Reviewed  POCT INFLUENZA A/B    Imaging Review No results found.  MDM   1. Influenza A with respiratory manifestations    Rapid flu test positive for influenza A  Treatment options discussed, as well as risks, benefits, alternatives. Patient voiced understanding and agreement with the following plans: Symptomatic care discussed.--Push fluids. Fever reduction methods discussed. Anticipatory guidance discussed. Parameters to return to work discussed.  Discharge Medication List as of 04/15/2015 12:13 PM    START taking these medications   Details  oseltamivir (TAMIFLU) 75 MG capsule Starting today, take 1 capsule by mouth twice a day for 5 days., Print    promethazine-codeine (PHENERGAN WITH CODEINE) 6.25-10 MG/5ML syrup Take 1-2 teaspoons every 4-6 hours as needed for cough. May cause drowsiness., Print      Follow-up with your primary care doctor in 5-7 days if not improving, or sooner if symptoms become worse. Precautions discussed. Red flags discussed. Questions invited and answered. Patient voiced understanding and agreement.    Lajean Manes, MD 04/15/15 1218

## 2015-04-15 NOTE — ED Notes (Signed)
Reports 2 day history of cough, congestion, sense of fever, aches, chest discomfort with cough, sore throat, diarrhea and vomiting. Took Tyleno cold/flu at 0900 today.

## 2015-05-03 ENCOUNTER — Ambulatory Visit (INDEPENDENT_AMBULATORY_CARE_PROVIDER_SITE_OTHER): Payer: Managed Care, Other (non HMO) | Admitting: Psychology

## 2015-05-03 DIAGNOSIS — F4323 Adjustment disorder with mixed anxiety and depressed mood: Secondary | ICD-10-CM | POA: Diagnosis not present

## 2015-05-13 ENCOUNTER — Ambulatory Visit (INDEPENDENT_AMBULATORY_CARE_PROVIDER_SITE_OTHER): Payer: Managed Care, Other (non HMO) | Admitting: Psychology

## 2015-05-13 DIAGNOSIS — F4323 Adjustment disorder with mixed anxiety and depressed mood: Secondary | ICD-10-CM | POA: Diagnosis not present

## 2015-05-20 ENCOUNTER — Telehealth: Payer: Self-pay | Admitting: Sports Medicine

## 2015-05-20 DIAGNOSIS — M1611 Unilateral primary osteoarthritis, right hip: Secondary | ICD-10-CM

## 2015-05-20 NOTE — Telephone Encounter (Signed)
MR arthrogram ordered, I will see him for the contrast injection.

## 2015-05-20 NOTE — Assessment & Plan Note (Signed)
Persistent pain in the right groin, temporary response to femoroacetabular joint injection, mechanical symptoms suggestive of a labral injury. At this point he does need operative intervention, we are going to proceed with an MR arthrogram for preoperative planning.  I will see the patient back for the contrast injection.

## 2015-05-20 NOTE — Telephone Encounter (Signed)
Patient called request to get a Mri scheduled for his hip he is in server pain can barley sit down. Thanks

## 2015-05-22 ENCOUNTER — Ambulatory Visit (INDEPENDENT_AMBULATORY_CARE_PROVIDER_SITE_OTHER): Payer: Managed Care, Other (non HMO) | Admitting: Psychology

## 2015-05-22 DIAGNOSIS — F4323 Adjustment disorder with mixed anxiety and depressed mood: Secondary | ICD-10-CM | POA: Diagnosis not present

## 2015-05-27 ENCOUNTER — Ambulatory Visit (INDEPENDENT_AMBULATORY_CARE_PROVIDER_SITE_OTHER): Payer: Managed Care, Other (non HMO) | Admitting: Sports Medicine

## 2015-05-27 ENCOUNTER — Ambulatory Visit (INDEPENDENT_AMBULATORY_CARE_PROVIDER_SITE_OTHER): Payer: Managed Care, Other (non HMO)

## 2015-05-27 VITALS — BP 130/89 | HR 102 | Wt 201.1 lb

## 2015-05-27 DIAGNOSIS — M1611 Unilateral primary osteoarthritis, right hip: Secondary | ICD-10-CM

## 2015-05-27 DIAGNOSIS — M1711 Unilateral primary osteoarthritis, right knee: Secondary | ICD-10-CM

## 2015-05-27 NOTE — Addendum Note (Signed)
Addended by: Baird KayUGLAS, Shariece Viveiros M on: 05/27/2015 10:14 AM   Modules accepted: Orders, Medications

## 2015-05-27 NOTE — Progress Notes (Signed)
  Procedure: Real-time Ultrasound Guided gadolinium contrast injection of right hip joint Device: GE Logiq E  Verbal informed consent obtained.  Time-out conducted.  Noted no overlying erythema, induration, or other signs of local infection.  Skin prepped in a sterile fashion.  Local anesthesia: Topical Ethyl chloride.  With sterile technique and under real time ultrasound guidance:  Spinal needle advanced to the femoral head/neck junction, 1 mL kenalog 40, 2 mL lidocaine, 2 mL Marcaine injected easily, syringe switched and 0.1 mL gadolinium injected, syringe again switched and 10 mL sterile saline injected to flush the needle. Joint visualized and capsule seen distending confirming intra-articular placement of contrast material and medication. Completed without difficulty  Advised to call if fevers/chills, erythema, induration, drainage, or persistent bleeding.  Images permanently stored and available for review in the ultrasound unit.  Impression: Technically successful ultrasound guided gadolinium contrast injection for MR arthrography.  Please see separate MR arthrogram report.

## 2015-05-27 NOTE — Assessment & Plan Note (Signed)
Persistent pain with mechanical symptoms, temporary responses to prior hip joint injections. Injection for MR arthrogram as above, he will likely need a hip replacement or resurfacing.

## 2015-06-05 ENCOUNTER — Telehealth: Payer: Self-pay | Admitting: Sports Medicine

## 2015-06-05 NOTE — Telephone Encounter (Signed)
Pt called clinic to see PCP's opinion on upcoming Ortho appt. Pt states the first available appt he would get for surgery will be 07/05/15 days from now.  Dr. Corinna CapraPill is trying to do just a scope without THA to give some pain relief but allow Pt to continue working.Pt wants to know if PCP thinks that is too long to wait for the hip scope (Pt still refusing THA). Will route for review.

## 2015-06-06 NOTE — Telephone Encounter (Signed)
No, that's fine, needs to communicate with Dr. Amanda PeaPill's office regarding doing it earlier if they have an opening.

## 2015-06-06 NOTE — Telephone Encounter (Signed)
Left information on Pt's VM, callback provided for any questions.  

## 2015-06-12 ENCOUNTER — Ambulatory Visit (INDEPENDENT_AMBULATORY_CARE_PROVIDER_SITE_OTHER): Payer: Managed Care, Other (non HMO) | Admitting: Psychology

## 2015-06-12 DIAGNOSIS — F4323 Adjustment disorder with mixed anxiety and depressed mood: Secondary | ICD-10-CM | POA: Diagnosis not present

## 2015-06-19 ENCOUNTER — Ambulatory Visit (INDEPENDENT_AMBULATORY_CARE_PROVIDER_SITE_OTHER): Payer: Managed Care, Other (non HMO) | Admitting: Psychology

## 2015-06-19 DIAGNOSIS — F4323 Adjustment disorder with mixed anxiety and depressed mood: Secondary | ICD-10-CM

## 2015-07-03 ENCOUNTER — Ambulatory Visit: Payer: Managed Care, Other (non HMO) | Admitting: Psychology

## 2015-07-08 ENCOUNTER — Telehealth: Payer: Self-pay | Admitting: Sports Medicine

## 2015-07-08 DIAGNOSIS — M5416 Radiculopathy, lumbar region: Secondary | ICD-10-CM

## 2015-07-08 NOTE — Telephone Encounter (Signed)
Referral placed.

## 2015-07-08 NOTE — Telephone Encounter (Signed)
Patient called request to get a referral to [facility] he has an appointment in our office tomorrow but wanted to call to get the referral started. Thanks

## 2015-07-09 ENCOUNTER — Ambulatory Visit: Payer: Self-pay | Admitting: Sports Medicine

## 2015-07-09 ENCOUNTER — Ambulatory Visit (INDEPENDENT_AMBULATORY_CARE_PROVIDER_SITE_OTHER): Payer: Managed Care, Other (non HMO) | Admitting: Sports Medicine

## 2015-07-09 ENCOUNTER — Encounter: Payer: Self-pay | Admitting: Sports Medicine

## 2015-07-09 VITALS — BP 131/81 | HR 98 | Resp 18 | Wt 198.7 lb

## 2015-07-09 DIAGNOSIS — M545 Low back pain: Secondary | ICD-10-CM | POA: Diagnosis not present

## 2015-07-09 DIAGNOSIS — M5416 Radiculopathy, lumbar region: Secondary | ICD-10-CM

## 2015-07-09 DIAGNOSIS — M1611 Unilateral primary osteoarthritis, right hip: Secondary | ICD-10-CM

## 2015-07-09 MED ORDER — CYCLOBENZAPRINE HCL 10 MG PO TABS: ORAL_TABLET | ORAL | Status: DC

## 2015-07-09 NOTE — Assessment & Plan Note (Signed)
Initially did well after an L5-S1 interlaminar epidural however continues to have pain , desires a surgical opinion.  [facility] , Dr. Artis FlockWolfe.

## 2015-07-09 NOTE — Assessment & Plan Note (Signed)
Severe degenerative labral tearing as well as osteoarthritis, did well initially with hip joint injections. MRI confirmed the above. At this point I do think he needs a total hip arthroplasty versus resurfacing, he is resistant to this, and would simply like a labral debridement knee arthroscopy , he understands that this may not provide long-term relief.  referral [facility]

## 2015-07-09 NOTE — Progress Notes (Signed)
  Subjective:    CC: Follow-up  HPI: Lumbar radiculitis: Responded well to an L5-S1 interlaminar epidural, unfortunately is having a recurrence of pain and desires a surgical opinion.  Left hip osteoarthritis: With degenerative labral tearing noted on MR arthrogram, [facility] of hip [facility]rosc[facility].  He is well informed that he likely needs total hip arthroplasty.  Past medical history, Surgical history, Family history not pertinant except as noted below, Social history, Allergies, and medications have been entered into the medical record, reviewed, and no changes needed.   Review of Systems: No fevers, chills, night sweats, weight loss, chest pain, or shortness of breath.   Objective:    General: Well Developed, well nourished, and in no acute distress.  Neuro: Alert and oriented x3, extra-ocular muscles intact, sensation grossly intact.  HEENT: Normocephalic, atraumatic, pupils equal round reactive to light, neck supple, no masses, no lymphadenopathy, thyroid nonpalpable.  Skin: Warm and dry, no rashes. Cardiac: Regular rate and rhythm, no murmurs rubs or gallops, no lower extremity edema.  Respiratory: Clear to auscultation bilaterally. Not using accessory muscles, speaking in full sentences.  Impression and Recommendations:

## 2015-11-05 ENCOUNTER — Encounter: Payer: Self-pay | Admitting: Rehabilitative and Restorative Service Providers"

## 2015-11-05 ENCOUNTER — Ambulatory Visit (INDEPENDENT_AMBULATORY_CARE_PROVIDER_SITE_OTHER): Payer: Managed Care, Other (non HMO) | Admitting: Rehabilitative and Restorative Service Providers"

## 2015-11-05 DIAGNOSIS — M25551 Pain in right hip: Secondary | ICD-10-CM | POA: Diagnosis not present

## 2015-11-05 DIAGNOSIS — M6281 Muscle weakness (generalized): Secondary | ICD-10-CM | POA: Diagnosis not present

## 2015-11-05 DIAGNOSIS — R2689 Other abnormalities of gait and mobility: Secondary | ICD-10-CM | POA: Diagnosis not present

## 2015-11-05 DIAGNOSIS — R29898 Other symptoms and signs involving the musculoskeletal system: Secondary | ICD-10-CM

## 2015-11-05 NOTE — Therapy (Signed)
[facility] [place] [address] [place], [place], [address] Phone: [phone]   Fax:  [phone]  Physical Therapy Evaluation  Patient Details  Name: Jerry Shannon MRN: [id number] Date of Birth: 12-18-1970 Referring Provider: Dr. Precious Bard  Encounter Date: 11/05/2015      PT End of Session - 11/05/15 6962    Visit Number 1   Number of Visits 12   Date for PT Re-Evaluation 12/17/15   PT Start Time 0822   PT Stop Time 0920   PT Time Calculation (min) 58 min   Activity Tolerance Patient tolerated treatment well      Past Medical History:  Diagnosis Date  . Back injury   . GERD (gastroesophageal reflux disease)   . History of hiatal hernia     Past Surgical History:  Procedure Laterality Date  . CHONDROPLASTY Left 11/07/2014   Procedure: CHONDROPLASTY;  Surgeon: Jodi Geralds, MD;  Location: [facility];  Service: Orthopedics;  Laterality: Left;  . HERNIA REPAIR    . KNEE ARTHROSCOPY WITH LATERAL MENISECTOMY Left 11/07/2014   Procedure: Knee arthroscopy with lateral menisectomy, with lateral plica;  Surgeon: Jodi Geralds, MD;  Location: [facility];  Service: Orthopedics;  Laterality: Left;  . KNEE SURGERY      There were no vitals filed for this visit.       Subjective Assessment - 11/05/15 0831    Subjective Patient reports significant pain in the Rt hip over the past 6 months. Symptoms progressed and did not respond to conservative treatment. He underwent Rt THA 10/09/15 with fx of the acetabelum during surgery. He is healing well and pain free in the Rt hip and LE.    Pertinent History LBP for ~5 years; Lt knee surgeries x 3 last 8/16; acid reflux   How long can you sit comfortably? no limit   How long can you stand comfortably? 50% weight bearing - standing on Lt LE no limit   How long can you walk comfortably? no limit with rolling walker    Diagnostic tests xrays; MRI     Patient Stated Goals per MD order - got to get back to work - get off walker   Currently in Pain? No/denies   Pain Location Hip   Pain Orientation Right  anterior hip/thigh - quad    Pain Descriptors / Indicators Aching   Pain Type Chronic pain   Pain Onset More than a month ago   Pain Frequency Intermittent   Aggravating Factors  moving the leg laterally; lifting leg   Pain Relieving Factors ice; rest; meds            [facility] PT Assessment - 11/05/15 0001      Assessment   Medical Diagnosis Rt total hip arthroplasty   Referring Provider Dr. Precious Bard   Onset Date/Surgical Date 10/09/15   Hand Dominance Right   Next MD Visit 11/07/15   Prior Therapy yes     Precautions   Precaution Comments THA precautions      Restrictions   Weight Bearing Restrictions Yes   RLE Weight Bearing Partial weight bearing   RLE Partial Weight Bearing Percentage or Pounds 50%     Balance Screen   Has the patient fallen in the past 6 months No   Has the patient had a decrease in activity level because of a fear of falling?  No   Is the patient reluctant to leave their home because  of a fear of falling?  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  yard work; Medical illustratorrecliner; TV; motorcycles      Observation/Other Assessments   Skin Integrity healed incision Rt posterior lateral hip    Focus on Therapeutic Outcomes (FOTO)  70% limitation      Sensation   Additional Comments WFL's per pt report      Posture/Postural Control   Posture Comments flexed forward at hips in standing; weight shifted to the Lt      AROM   Overall AROM Comments WFL's Lt LE; Rt knee - Rt hip limited  to 90 deg flex; 30 deg abd; neutral hip ext      Strength   Right/Left Hip --  Lt hip 5/5    Right Hip Flexion 3/5   Right Hip Extension 3/5   Right Hip ABduction 2+/5     Flexibility   Hamstrings Rt 40 deg; Lt 40 deg   Quadriceps Rt 90 deg Lt 110 deg knee flex in prone   ITB NT      Palpation   Palpation comment tight anterior lateral Rt hip; quads; TFL; IT band; hamstrings     Ambulation/Gait   Gait Comments ambulates with rolling walker with walker forward; more than 50% weight bearing                    [facility] Adult PT Treatment/Exercise - 11/05/15 0001      Ambulation/Gait   Ambulation/Gait --  gait training 50% WB - assessed with scale      Knee/Hip Exercises: Stretches   Passive Hamstring Stretch 2 reps;30 seconds  PT assist required    Quad Stretch 3 reps;30 seconds  prone with strap    Other Knee/Hip Stretches hip abduction with PT assist - gentle 20 sec x 2      Knee/Hip Exercises: Standing   Hip Abduction Right;10 reps   Hip Extension Right;10 reps     Knee/Hip Exercises: Prone   Straight Leg Raises Right;10 reps   Other Prone Exercises glut set 10 sec x 10      Moist Heat Therapy   Number Minutes Moist Heat 15 Minutes   Moist Heat Location Hip  Rt     Electrical Stimulation   Electrical Stimulation Location Rt hip    Electrical Stimulation Action IFC   Electrical Stimulation Parameters to tolerance   Electrical Stimulation Goals Pain;Tone                PT Education - 11/05/15 [phone]    Education provided Yes   Education Details HEP; gait with 50% WB    Person(s) Educated Patient   Methods Explanation;Demonstration;Tactile cues;Verbal cues;Handout   Comprehension Verbalized understanding;Returned demonstration;Verbal cues required;Tactile cues required             PT Long Term Goals - 11/05/15 0819      PT LONG TERM GOAL #1   Title I with HEP 12/17/15   Time 6   Period Weeks   Status New     PT LONG TERM GOAL #2    Title Progress gait  to least assistive device with good gait pattern 12/17/15   Time 6   Period Weeks   Status New     PT LONG TERM GOAL #3   Title report pain decrease =/> 75% with daily acitivities 12/17/15   Time 6   Period Weeks   Status New     PT LONG TERM GOAL #4   Title increase LE strength =/> 4+/5 to 5-/5 to allow return to function and work 12/17/15   Time 6   Period Weeks   Status New     PT LONG TERM GOAL #5   Title improve FOTO =/< 48% limited  12/17/15   Time 6   Period Weeks   Status New               Plan - 11/05/15 0929    Clinical Impression Statement Patient presents s/p Rt THA posterior lateral approch 10/09/15 with hip precautions and 50% WB Rt LE on rolling walker. Patient has limited ROM; strength; abnormal gait pattern; muscular tightness to palpation; limited functional activity level.    Rehab Potential Good   PT Frequency 2x / week   PT Duration 6 weeks   PT Treatment/Interventions Patient/family education;ADLs/Self Care Home Management;Cryotherapy;Electrical Stimulation;Iontophoresis 4mg /ml Dexamethasone;Moist Heat;Ultrasound;Manual techniques;Dry needling;Therapeutic exercise;Therapeutic activities   PT Next Visit Plan progress with hip rehab as tolerated    Consulted and Agree with Plan of Care Patient      Patient will benefit from skilled therapeutic intervention in order to improve the following deficits and impairments:  Postural dysfunction, Improper body mechanics, Pain, Abnormal gait, Decreased range of motion, Decreased mobility, Decreased strength, Decreased activity tolerance  Visit Diagnosis: Pain in right hip - Plan: PT plan of care cert/re-cert  Muscle weakness (generalized) - Plan: PT plan of care cert/re-cert  Other abnormalities of gait and mobility - Plan: PT plan of care cert/re-cert  Other symptoms and signs involving the musculoskeletal system - Plan: PT plan of care cert/re-cert     Problem List Patient  Active Problem List   Diagnosis Date Noted  . Primary osteoarthritis of right hip 12/07/2014  . Folliculitis 12/07/2014  . Excessive daytime sleepiness 11/06/2014  . Primary osteoarthritis of left knee with lateral meniscal tear postarthroscopy 09/21/2014  . Insomnia 09/21/2014  . Erectile dysfunction 09/21/2014  . Tinea cruris 10/20/2013  . Hyperlipidemia LDL goal < 100 04/29/2012  . Left lumbar radiculopathy 04/29/2012  . Hypogonadism male 04/29/2012  . Fatigue 04/01/2012  . Radiculitis of left cervical region 04/01/2012  . Chronic diarrhea 04/01/2012  . History of nasal fracture 04/01/2012    Ailine Hefferan Rober Minion PT, MPH  11/05/2015, 12:56 PM  [facility] [address] [place], [place], [address] Phone: [phone]   Fax:  [phone]  Name: Jerry Shannon MRN: [id number] Date of Birth: 30-Jun-1970

## 2015-11-05 NOTE — Patient Instructions (Addendum)
50% of weight only!!!!      HIP: Hamstrings - Supine  - As tolerated    Place strap around foot. Raise leg up, keeping knee straight.  Bend opposite knee to protect back if indicated. Hold 30 seconds. 3 reps per set, 2-3 sets per day   Quads / HF, Prone   Lie face down. Grasp one ankle with same-side hand. Use towel if needed to reach. Gently pull foot toward buttock.  Hold 30 seconds. Repeat 3 times per session. Do 2-3 sessions per day.  Gluteal Sets can do lying on stomach     Squeeze pelvic floor and hold. Tighten bottom. Hold for _10__ seconds. Relax for __1-2 seconds. Repeat _10-20__ times. Do __2-3_ times a day.     Strengthening: Hip Extension (Prone)    Tighten muscles on front of left thigh, then lift leg __8-10__ inches from surface, keeping knee locked. Repeat __10__ times per set. Do __1-3__ sets per session. Do _1-2___ sessions per day.    Strengthening: Hip Abduction - NO BANDS    With tubing around right leg, other side toward anchor, extend leg out from side. Repeat __10__ times per set. Do __1-2__ sets per session. Do _1-2___ sessions per day.    Strengthening: Hip Extension - NO BANDS    With tubing around right ankle, face anchor and pull leg straight back. Repeat __10__ times per set. Do _1-2___ sets per session. Do _1-2___ sessions per day.

## 2015-11-06 ENCOUNTER — Ambulatory Visit: Payer: Self-pay | Admitting: Rehabilitative and Restorative Service Providers"

## 2015-11-06 ENCOUNTER — Ambulatory Visit (INDEPENDENT_AMBULATORY_CARE_PROVIDER_SITE_OTHER): Payer: Managed Care, Other (non HMO) | Admitting: Sports Medicine

## 2015-11-06 ENCOUNTER — Encounter: Payer: Self-pay | Admitting: Sports Medicine

## 2015-11-06 DIAGNOSIS — Z9889 Other specified postprocedural states: Secondary | ICD-10-CM

## 2015-11-06 DIAGNOSIS — R000 Tachycardia, unspecified: Secondary | ICD-10-CM | POA: Diagnosis not present

## 2015-11-06 DIAGNOSIS — G4700 Insomnia, unspecified: Secondary | ICD-10-CM | POA: Diagnosis not present

## 2015-11-06 DIAGNOSIS — F39 Unspecified mood [affective] disorder: Secondary | ICD-10-CM | POA: Diagnosis not present

## 2015-11-06 DIAGNOSIS — Z96641 Presence of right artificial hip joint: Secondary | ICD-10-CM

## 2015-11-06 MED ORDER — SUVOREXANT 10 MG PO TABS: 1.0000 | ORAL_TABLET | Freq: Every day | ORAL | 0 refills | Status: DC

## 2015-11-06 NOTE — Assessment & Plan Note (Signed)
Referral to behavioral health per patient request

## 2015-11-06 NOTE — Assessment & Plan Note (Signed)
Discontinue hydroxyzine, switching to [employer]

## 2015-11-06 NOTE — Progress Notes (Signed)
  Subjective:    CC: Follow-up  HPI: Jerry Shannon is post right hip arthroplasty, overall he did well, it sounds as though there was a hint of a periprosthetic acetabular fracture, however he is doing well, he is on 50% weightbearing for now. He was very tachycardic postoperatively, also drinks excessively, has never had an echocardiogram, no chest pain. Light fatigue.  Insomnia: Insufficient control at this point with hydroxyzine, wonders what else we can do.  Mood disorder: Denies depressive symptoms, doesn't want me to work this up any further but does want a referral downstairs to behavioral health.  Past medical history, Surgical history, Family history not pertinant except as noted below, Social history, Allergies, and medications have been entered into the medical record, reviewed, and no changes needed.   Review of Systems: No fevers, chills, night sweats, weight loss, chest pain, or shortness of breath.   Objective:    General: Well Developed, well nourished, and in no acute distress.  Neuro: Alert and oriented x3, extra-ocular muscles intact, sensation grossly intact.  HEENT: Normocephalic, atraumatic, pupils equal round reactive to light, neck supple, no masses, no lymphadenopathy, thyroid nonpalpable.  Skin: Warm and dry, no rashes. Cardiac: Tachycardic with regular rhythm, no murmurs rubs or gallops, no lower extremity edema.  Respiratory: Clear to auscultation bilaterally. Not using accessory muscles, speaking in full sentences.  Impression and Recommendations:    History of arthroplasty of right hip Overall doing well, it does sound as though there was a periprosthetic acetabular fracture, he is however on 50% weightbearing. Further management through orthopedist.  Insomnia Discontinue hydroxyzine, switching to Belsomra  Tachycardia Post-hip arthroplasty, also has history of excessive alcohol consumption. Checking blood work, tox screen, echocardiogram. Hemoglobin dropped  to 11 postop  Mood disorder (HCC) Referral to behavioral health per patient request  I spent 40 minutes with this patient, greater than 50% was face-to-face time counseling regarding the above diagnoses

## 2015-11-06 NOTE — Assessment & Plan Note (Signed)
Overall doing well, it does sound as though there was a periprosthetic acetabular fracture, he is however on 50% weightbearing. Further management through orthopedist.

## 2015-11-06 NOTE — Assessment & Plan Note (Addendum)
Post-hip arthroplasty, also has history of excessive alcohol consumption. Checking blood work, tox screen, echocardiogram. Hemoglobin dropped to 11 postop

## 2015-11-07 ENCOUNTER — Ambulatory Visit: Payer: Self-pay | Admitting: Rehabilitative and Restorative Service Providers"

## 2015-11-07 LAB — CBC
HCT: 43.5 % (ref 38.5–50.0)
Hemoglobin: 14.7 g/dL (ref 13.2–17.1)
MCH: 32.4 pg (ref 27.0–33.0)
MCHC: 33.8 g/dL (ref 32.0–36.0)
MCV: 95.8 fL (ref 80.0–100.0)
MPV: 9.1 fL (ref 7.5–12.5)
Platelets: 244 K/uL (ref 140–400)
RBC: 4.54 MIL/uL (ref 4.20–5.80)
RDW: 13 % (ref 11.0–15.0)
WBC: 7.6 10*3/uL (ref 3.8–10.8)

## 2015-11-07 LAB — COMPREHENSIVE METABOLIC PANEL
ALT: 65 U/L — ABNORMAL HIGH (ref 9–46)
AST: 40 U/L (ref 10–40)
Albumin: 4.2 g/dL (ref 3.6–5.1)
CO2: 24 mmol/L (ref 20–31)
Creat: 0.86 mg/dL (ref 0.60–1.35)
Sodium: 139 mmol/L (ref 135–146)
Total Bilirubin: 0.7 mg/dL (ref 0.2–1.2)
Total Protein: 6.8 g/dL (ref 6.1–8.1)

## 2015-11-07 LAB — COMPREHENSIVE METABOLIC PANEL WITH GFR
Alkaline Phosphatase: 68 U/L (ref 40–115)
BUN: 10 mg/dL (ref 7–25)
Calcium: 9.8 mg/dL (ref 8.6–10.3)
Chloride: 100 mmol/L (ref 98–110)
Glucose, Bld: 105 mg/dL — ABNORMAL HIGH (ref 65–99)
Potassium: 4.3 mmol/L (ref 3.5–5.3)

## 2015-11-07 LAB — TSH: TSH: 3.33 m[IU]/L (ref 0.40–4.50)

## 2015-11-08 ENCOUNTER — Ambulatory Visit (INDEPENDENT_AMBULATORY_CARE_PROVIDER_SITE_OTHER): Payer: Managed Care, Other (non HMO) | Admitting: Physical Therapy

## 2015-11-08 DIAGNOSIS — M25551 Pain in right hip: Secondary | ICD-10-CM | POA: Diagnosis not present

## 2015-11-08 DIAGNOSIS — M6281 Muscle weakness (generalized): Secondary | ICD-10-CM | POA: Diagnosis not present

## 2015-11-08 DIAGNOSIS — R29898 Other symptoms and signs involving the musculoskeletal system: Secondary | ICD-10-CM

## 2015-11-08 DIAGNOSIS — R2689 Other abnormalities of gait and mobility: Secondary | ICD-10-CM

## 2015-11-08 NOTE — Therapy (Signed)
[facility] [place] [address] [place], [place], [address] Phone: [phone]   Fax:  [phone]  Physical Therapy Treatment  Patient Details  Name: Jerry Shannon MRN: [id number] Date of Birth: Mar 29, 1970 Referring Provider: Dr. Davene Costain   Encounter Date: 11/08/2015      PT End of Session - 11/08/15 0854    Visit Number 2   Number of Visits 12   Date for PT Re-Evaluation 12/17/15   PT Start Time 0850   PT Stop Time 0932   PT Time Calculation (min) 42 min      Past Medical History:  Diagnosis Date  . Back injury   . GERD (gastroesophageal reflux disease)   . History of hiatal hernia     Past Surgical History:  Procedure Laterality Date  . CHONDROPLASTY Left 11/07/2014   Procedure: CHONDROPLASTY;  Surgeon: Jodi Geralds, MD;  Location: [facility];  Service: Orthopedics;  Laterality: Left;  . HERNIA REPAIR    . KNEE ARTHROSCOPY WITH LATERAL MENISECTOMY Left 11/07/2014   Procedure: Knee arthroscopy with lateral menisectomy, with lateral plica;  Surgeon: Jodi Geralds, MD;  Location: [facility];  Service: Orthopedics;  Laterality: Left;  . KNEE SURGERY      There were no vitals filed for this visit.      Subjective Assessment - 11/08/15 0855    Subjective Pt reports he had Dr appt and he reports he has been cleared to return to work on 11/23/15, can drive,  and is now WBAT.   He ambulates in to treatment with SPC.    Currently in Pain? No/denies            [facility] PT Assessment - 11/08/15 0001      Assessment   Medical Diagnosis Rt total hip arthroplasty   Referring Provider Dr. Davene Costain    Onset Date/Surgical Date 10/09/15   Hand Dominance Right   Next MD Visit 12/12/15     Precautions   Precaution Comments Posterior hip precautions     Restrictions   RLE Weight Bearing Weight bearing as tolerated           [facility] Adult PT Treatment/Exercise - 11/08/15 0001      Self-Care    Self-Care Other Self-Care Comments;Scar Mobilizations   Scar Mobilizations Educate pt on how to perform scar massage; pt verbalized understanding and returned demo.      Knee/Hip Exercises: Stretches   Passive Hamstring Stretch Right;Left;3 reps;30 seconds  seated, straight back   Passive Hamstring Stretch Limitations unable to tolerate supine with strap   Quad Stretch 3 reps;30 seconds;Right  prone with strap    Gastroc Stretch Right;Left;30 seconds     Knee/Hip Exercises: Standing   Heel Raises Both;20 reps   Gait Training Gait training with SPC:  ~ 300 ft.  Partial step through to step through.  VC for step pattern, decreasing Rt step length, upright posture, placement of SPC.  Improved with repetition of cues/distance.     Other Standing Knee Exercises Weight shifts on to scale, to tolerance x 10 reps      Knee/Hip Exercises: Prone   Straight Leg Raises Strengthening;Right;3 sets;10 reps   Straight Leg Raises Limitations VC to engage to core     Modalities   Modalities --  pt declined     Manual Therapy   Manual Therapy Taping   Manual therapy comments Rock strip applied to midsection of Rt hip incision to aid in scar management.  PT Long Term Goals - 11/08/15 1056      PT LONG TERM GOAL #1   Title I with HEP 12/17/15   Time 6   Period Weeks   Status On-going     PT LONG TERM GOAL #2   Title Progress gait to least assistive device with good gait pattern 12/17/15   Time 6   Period Weeks   Status On-going     PT LONG TERM GOAL #3   Title report pain decrease =/> 75% with daily acitivities 12/17/15   Time 6   Period Weeks   Status On-going     PT LONG TERM GOAL #4   Title increase LE strength =/> 4+/5 to 5-/5 to allow return to function and work 12/17/15   Time 6   Period Weeks   Status On-going     PT LONG TERM GOAL #5   Title improve FOTO =/< 48% limited  12/17/15   Time 6   Period Weeks   Status On-going                Plan - 11/08/15 0934    Clinical Impression Statement Pt tolerated short gait trials without increased pain.  Weight shifts onto scale indicated pt is WB ~80% to tolerance.  While pt was educated about scar massage, a stitch was still visible at distal end of incision. Pt encouraged to call MD regarding removal.   Pt declined modalities; will perform at home.    Rehab Potential Good   PT Frequency 2x / week   PT Duration 6 weeks   PT Treatment/Interventions Patient/family education;ADLs/Self Care Home Management;Cryotherapy;Electrical Stimulation;Iontophoresis 4mg /ml Dexamethasone;Moist Heat;Ultrasound;Manual techniques;Dry needling;Therapeutic exercise;Therapeutic activities   PT Next Visit Plan progress with hip rehab as tolerated with hip precautions.    Consulted and Agree with Plan of Care Patient      Patient will benefit from skilled therapeutic intervention in order to improve the following deficits and impairments:  Postural dysfunction, Improper body mechanics, Pain, Abnormal gait, Decreased range of motion, Decreased mobility, Decreased strength, Decreased activity tolerance  Visit Diagnosis: Pain in right hip  Muscle weakness (generalized)  Other abnormalities of gait and mobility  Other symptoms and signs involving the musculoskeletal system     Problem List Patient Active Problem List   Diagnosis Date Noted  . Tachycardia 11/06/2015  . Mood disorder (HCC) 11/06/2015  . History of arthroplasty of right hip 12/07/2014  . Excessive daytime sleepiness 11/06/2014  . Primary osteoarthritis of left knee with lateral meniscal tear postarthroscopy 09/21/2014  . Insomnia 09/21/2014  . Erectile dysfunction 09/21/2014  . Hyperlipidemia LDL goal < 100 04/29/2012  . Left lumbar radiculopathy 04/29/2012  . Hypogonadism male 04/29/2012  . Radiculitis of left cervical region 04/01/2012   Mayer CamelJennifer Carlson-Long, PTA 11/08/15 10:57 AM  [facility] [address] [place], [place], [address] Phone: [phone]   Fax:  [phone]  Name: Jerry Shannon MRN: [id number] Date of Birth: 08/31/1970

## 2015-11-12 ENCOUNTER — Ambulatory Visit (INDEPENDENT_AMBULATORY_CARE_PROVIDER_SITE_OTHER): Payer: Managed Care, Other (non HMO) | Admitting: Physical Therapy

## 2015-11-12 ENCOUNTER — Encounter: Payer: Self-pay | Admitting: Physical Therapy

## 2015-11-12 DIAGNOSIS — M25551 Pain in right hip: Secondary | ICD-10-CM

## 2015-11-12 DIAGNOSIS — M6281 Muscle weakness (generalized): Secondary | ICD-10-CM | POA: Diagnosis not present

## 2015-11-12 DIAGNOSIS — R2689 Other abnormalities of gait and mobility: Secondary | ICD-10-CM

## 2015-11-12 NOTE — Therapy (Signed)
[place], [place], [address] Phone: [phone]   Fax:  [phone]  Physical Therapy Treatment  Patient Details  Name: Jerry Shannon MRN: [id number] Date of Birth: 10-Sep-1970 Referring Provider: Dr Queen Slough  Encounter Date: 11/12/2015      PT End of Session - 11/12/15 0935    Visit Number 3   Number of Visits 12   Date for PT Re-Evaluation 12/17/15   PT Start Time 0935   PT Stop Time 1032   PT Time Calculation (min) 57 min   Activity Tolerance Patient limited by pain      Past Medical History:  Diagnosis Date  . Back injury   . GERD (gastroesophageal reflux disease)   . History of hiatal hernia     Past Surgical History:  Procedure Laterality Date  . CHONDROPLASTY Left 11/07/2014   Procedure: CHONDROPLASTY;  Surgeon: Dorna Leitz, MD;  Location: [facility];  Service: Orthopedics;  Laterality: Left;  . HERNIA REPAIR    . KNEE ARTHROSCOPY WITH LATERAL MENISECTOMY Left 11/07/2014   Procedure: Knee arthroscopy with lateral menisectomy, with lateral plica;  Surgeon: Dorna Leitz, MD;  Location: [facility];  Service: Orthopedics;  Laterality: Left;  . KNEE SURGERY      There were no vitals filed for this visit.      Subjective Assessment - 11/12/15 0936    Subjective Pt has taken himself off all pain meds and anti-inflammatories as he had elevated HR and was scared.  Still has the tape   Currently in Pain? No/denies  stretching in the upper thigh            [facility] PT Assessment - 11/12/15 0001      Assessment   Medical Diagnosis Rt total hip arthroplasty   Referring Provider Dr Queen Slough   Onset Date/Surgical Date 10/09/15   Hand Dominance Right     Strength   Right Hip Flexion 3-/5   Right Hip Extension 4/5   Right Hip ABduction 3-/5     Flexibility   Quadriceps Rt 111                     [facility] Adult PT Treatment/Exercise - 11/12/15  0001      Knee/Hip Exercises: Stretches   Sports administrator Right;3 reps;30 seconds  prone with strap     Knee/Hip Exercises: Aerobic   Nustep L5x6'     Knee/Hip Exercises: Supine   Other Supine Knee/Hip Exercises leg lengthener x 10 reps 3 sec     Knee/Hip Exercises: Sidelying   Clams 3x8     Knee/Hip Exercises: Prone   Other Prone Exercises attempted SLR - very painful and difficult     Modalities   Modalities Moist Heat;Cryotherapy     Moist Heat Therapy   Number Minutes Moist Heat 15 Minutes   Moist Heat Location --  Rt quad     Cryotherapy   Number Minutes Cryotherapy 15 Minutes   Cryotherapy Location Hip  Rt   Type of Cryotherapy Ice pack     Electrical Stimulation   Electrical Stimulation Location --  pt declined stim - makes him nervous   Electrical Stimulation Action --   Electrical Stimulation Parameters --   Electrical Stimulation Goals --     Manual Therapy   Manual Therapy Soft tissue mobilization;Myofascial release   Soft tissue mobilization Rt quad TPR, STW, very tight and tender, attempted to TDN, pt to tender  to tolerate today.     Myofascial Release with roller and hands          Trigger Point Dry Needling - 11/12/15 1006    Consent Given? Yes   Education Handout Provided No   Muscles Treated Lower Body Quadriceps  Rt   Quadriceps Response Twitch response elicited  pt unable to tolerate                    PT Long Term Goals - 11/12/15 0938      PT LONG TERM GOAL #1   Title I with HEP 12/17/15   Time 6   Period Weeks   Status On-going     PT LONG TERM GOAL #2   Title Progress gait to least assistive device with good gait pattern 12/17/15   Time 6   Period Weeks   Status On-going     PT LONG TERM GOAL #3   Title report pain decrease =/> 75% with daily acitivities 12/17/15   Time 6   Period Weeks   Status On-going     PT LONG TERM GOAL #4   Title increase LE strength =/> 4+/5 to 5-/5 to allow return to function and  work 12/17/15   Time 6   Period Weeks   Status On-going     PT LONG TERM GOAL #5   Title improve FOTO =/< 48% limited  12/17/15   Time 6   Period Weeks   Status On-going               Plan - 11/12/15 1018    Clinical Impression Statement Jayvyn has significant weakness around the Rt hip, some of it due to pain and tightness in the hip musculature.  He is making slow progress because of this.  It is only his second week with OPPT, no goals met.    Rehab Potential Good   PT Frequency 2x / week   PT Duration 6 weeks   PT Treatment/Interventions Patient/family education;ADLs/Self Care Home Management;Cryotherapy;Electrical Stimulation;Iontophoresis 68m/ml Dexamethasone;Moist Heat;Ultrasound;Manual techniques;Dry needling;Therapeutic exercise;Therapeutic activities   PT Next Visit Plan would benefit from TDN if open to it.    Consulted and Agree with Plan of Care Patient      Patient will benefit from skilled therapeutic intervention in order to improve the following deficits and impairments:  Postural dysfunction, Improper body mechanics, Pain, Abnormal gait, Decreased range of motion, Decreased mobility, Decreased strength, Decreased activity tolerance  Visit Diagnosis: Pain in right hip  Muscle weakness (generalized)  Other abnormalities of gait and mobility     Problem List Patient Active Problem List   Diagnosis Date Noted  . Tachycardia 11/06/2015  . Mood disorder ([place] 11/06/2015  . History of arthroplasty of right hip 12/07/2014  . Excessive daytime sleepiness 11/06/2014  . Primary osteoarthritis of left knee with lateral meniscal tear postarthroscopy 09/21/2014  . Insomnia 09/21/2014  . Erectile dysfunction 09/21/2014  . Hyperlipidemia LDL goal < 100 04/29/2012  . Left lumbar radiculopathy 04/29/2012  . Hypogonadism male 04/29/2012  . Radiculitis of left cervical region 04/01/2012    SJeral PinchPT  11/12/2015, 10:21 AM  [facility] [address]Phone: [phone]  Fax:  [phone] Name: LBalian SchallerMRN: [id number] of Birth: 104-Nov-1972

## 2015-11-13 LAB — DRUG SCREEN 10 W/CONF, SERUM

## 2015-11-14 ENCOUNTER — Ambulatory Visit (INDEPENDENT_AMBULATORY_CARE_PROVIDER_SITE_OTHER): Payer: Managed Care, Other (non HMO) | Admitting: Physical Therapy

## 2015-11-14 DIAGNOSIS — R2689 Other abnormalities of gait and mobility: Secondary | ICD-10-CM | POA: Diagnosis not present

## 2015-11-14 DIAGNOSIS — R29898 Other symptoms and signs involving the musculoskeletal system: Secondary | ICD-10-CM

## 2015-11-14 DIAGNOSIS — M6281 Muscle weakness (generalized): Secondary | ICD-10-CM | POA: Diagnosis not present

## 2015-11-14 DIAGNOSIS — M25551 Pain in right hip: Secondary | ICD-10-CM | POA: Diagnosis not present

## 2015-11-14 NOTE — Therapy (Signed)
[facility] [address] [place], [place], [address] Phone: [phone]   Fax:  [phone]  Physical Therapy Treatment  Patient Details  Name: Jerry FenderLarry Shannon MRN: [id number] Date of Birth: 04-06-1970 Referring Provider: Dr Davene CostainBullock  Encounter Date: 11/14/2015      PT End of Session - 11/14/15 1151    Visit Number 4   Number of Visits 12   Date for PT Re-Evaluation 12/17/15   PT Start Time 1148   PT Stop Time 1245   PT Time Calculation (min) 57 min   Activity Tolerance Patient limited by pain      Past Medical History:  Diagnosis Date  . Back injury   . GERD (gastroesophageal reflux disease)   . History of hiatal hernia     Past Surgical History:  Procedure Laterality Date  . CHONDROPLASTY Left 11/07/2014   Procedure: CHONDROPLASTY;  Surgeon: Jodi GeraldsJohn Graves, MD;  Location: [facility];  Service: Orthopedics;  Laterality: Left;  . HERNIA REPAIR    . KNEE ARTHROSCOPY WITH LATERAL MENISECTOMY Left 11/07/2014   Procedure: Knee arthroscopy with lateral menisectomy, with lateral plica;  Surgeon: Jodi GeraldsJohn Graves, MD;  Location: [facility];  Service: Orthopedics;  Laterality: Left;  . KNEE SURGERY      There were no vitals filed for this visit.      Subjective Assessment - 11/14/15 1151    Subjective Pt reports he hasn't taken pain medicine in 4 days.  Pt has started having discomfort in his Rt/Lt knee.  He is up to 10 min on NuStep in gym and he did a light leg workout at gym with knee flex/ext with 20#.    Currently in Pain? No/denies          [facility] PT Treatment/Exercise - 11/14/15 0001      Knee/Hip Exercises: Stretches   Passive Hamstring Stretch Right;5 reps;20 seconds   Gastroc Stretch Right;Left;30 seconds;4 reps   Soleus Stretch Right;2 reps;30 seconds     Knee/Hip Exercises: Aerobic   Nustep L5x6'     Knee/Hip Exercises: Standing   Heel Raises Both;20 reps   Functional  Squat 2 sets;10 reps  (on elevated high-low table)    Functional Squat Limitations had pain in Rt lateral hamstring. Stopped, stretched hamstring.    Other Standing Knee Exercises Weight shifts on to scale, to tolerance x 10 reps side to side, 12 front to back. (      Knee/Hip Exercises: Sidelying   Clams 2x 10   Other Sidelying Knee/Hip Exercises Rt Leg swing front to neutral with leg on bolster x 8 reps      Modalities   Modalities Ultrasound     Ultrasound   Ultrasound Location Rt posterior mid hamstring, Rt hamstring at post knee/fibular head    Ultrasound Parameters 100% 1.1 w/cm2 x 5 min ; 50% 1.1 w/cm2, 5 min (to the areas respectively.    Ultrasound Goals Pain  tightness.      Manual Therapy   Manual Therapy Myofascial release   Myofascial Release Rt lateral distal quad / hamstring/ ITB                      PT Long Term Goals - 11/12/15 0938      PT LONG TERM GOAL #1   Title I with HEP 12/17/15   Time 6   Period Weeks   Status On-going     PT LONG TERM GOAL #2  Title Progress gait to least assistive device with good gait pattern 12/17/15   Time 6   Period Weeks   Status On-going     PT LONG TERM GOAL #3   Title report pain decrease =/> 75% with daily acitivities 12/17/15   Time 6   Period Weeks   Status On-going     PT LONG TERM GOAL #4   Title increase LE strength =/> 4+/5 to 5-/5 to allow return to function and work 12/17/15   Time 6   Period Weeks   Status On-going     PT LONG TERM GOAL #5   Title improve FOTO =/< 48% limited  12/17/15   Time 6   Period Weeks   Status On-going               Plan - 11/14/15 1249    Clinical Impression Statement Pt reported increased pain in Rt knee/lateral hamstring with mini-squats and clamshell exercises.  He reported some decrease in pain with manual therapy and [place] at end of session.     Rehab Potential Good   PT Frequency 2x / week   PT Duration 6 weeks   PT Treatment/Interventions  Patient/family education;ADLs/Self Care Home Management;Cryotherapy;Electrical Stimulation;Iontophoresis 4mg /ml Dexamethasone;Moist Heat;Ultrasound;Manual techniques;Dry needling;Therapeutic exercise;Therapeutic activities   PT Next Visit Plan Manual therapy / [place] to Rt hamstring, ITB.  Trial of Rock tape to same area to decompress tissue/ aid in desensitization. (pt plans to shave area prior).  Continue RLE strengthening as tolerated.    Consulted and Agree with Plan of Care Patient      Patient will benefit from skilled therapeutic intervention in order to improve the following deficits and impairments:  Postural dysfunction, Improper body mechanics, Pain, Abnormal gait, Decreased range of motion, Decreased mobility, Decreased strength, Decreased activity tolerance  Visit Diagnosis: Pain in right hip  Muscle weakness (generalized)  Other abnormalities of gait and mobility  Other symptoms and signs involving the musculoskeletal system     Problem List Patient Active Problem List   Diagnosis Date Noted  . Tachycardia 11/06/2015  . Mood disorder (HCC) 11/06/2015  . History of arthroplasty of right hip 12/07/2014  . Excessive daytime sleepiness 11/06/2014  . Primary osteoarthritis of left knee with lateral meniscal tear postarthroscopy 09/21/2014  . Insomnia 09/21/2014  . Erectile dysfunction 09/21/2014  . Hyperlipidemia LDL goal < 100 04/29/2012  . Left lumbar radiculopathy 04/29/2012  . Hypogonadism male 04/29/2012  . Radiculitis of left cervical region 04/01/2012   Mayer Camel, PTA 11/14/15 1:02 PM  [facility] [place] [address] [place], [place], [address] Phone: [phone]   Fax:  [phone]  Name: Jerry Shannon MRN: [id number] Date of Birth: [birth year]

## 2015-11-18 ENCOUNTER — Ambulatory Visit (INDEPENDENT_AMBULATORY_CARE_PROVIDER_SITE_OTHER): Payer: Managed Care, Other (non HMO) | Admitting: Physical Therapy

## 2015-11-18 DIAGNOSIS — R2689 Other abnormalities of gait and mobility: Secondary | ICD-10-CM | POA: Diagnosis not present

## 2015-11-18 DIAGNOSIS — M6281 Muscle weakness (generalized): Secondary | ICD-10-CM | POA: Diagnosis not present

## 2015-11-18 DIAGNOSIS — M25551 Pain in right hip: Secondary | ICD-10-CM

## 2015-11-18 NOTE — Therapy (Signed)
[facility] [address] [place], [place], [address] Phone: [phone]   Fax:  [phone]  Physical Therapy Treatment  Patient Details  Name: Jerry Shannon MRN: [id number] Date of Birth: [birth year] Referring Provider: Dr. Davene CostainBullock   Encounter Date: 11/18/2015      PT End of Session - 11/18/15 0936    Visit Number 5   Number of Visits 12   Date for PT Re-Evaluation 12/17/15   PT Start Time 0932   PT Stop Time 1017   PT Time Calculation (min) 45 min      Past Medical History:  Diagnosis Date  . Back injury   . GERD (gastroesophageal reflux disease)   . History of hiatal hernia     Past Surgical History:  Procedure Laterality Date  . CHONDROPLASTY Left 11/07/2014   Procedure: CHONDROPLASTY;  Surgeon: Jodi GeraldsJohn Graves, MD;  Location: [facility];  Service: Orthopedics;  Laterality: Left;  . HERNIA REPAIR    . KNEE ARTHROSCOPY WITH LATERAL MENISECTOMY Left 11/07/2014   Procedure: Knee arthroscopy with lateral menisectomy, with lateral plica;  Surgeon: Jodi GeraldsJohn Graves, MD;  Location: [facility];  Service: Orthopedics;  Laterality: Left;  . KNEE SURGERY      There were no vitals filed for this visit.      Subjective Assessment - 11/18/15 0933    Subjective Pt complains of tightness in his Rt hip.  Jerry NajjarLarry states he has felt great after last two sessions of therapy.     Currently in Pain? No/denies            [facility] - 11/18/15 0001      Assessment   Medical Diagnosis Rt total hip arthroplasty   Referring Provider Dr. Davene CostainBullock    Onset Date/Surgical Date 10/09/15   Hand Dominance Right   Next MD Visit 12/12/15     Precautions   Precaution Comments Posterior hip precautions     Restrictions   RLE Weight Bearing Weight bearing as tolerated     Flexibility   Quadriceps Rt knee 117 deg          [facility] Adult PT Treatment/Exercise - 11/18/15 0001      Knee/Hip Exercises:  Stretches   LobbyistQuad Stretch Right;3 reps;30 seconds   Gastroc Stretch Right;Left;30 seconds;4 reps   Soleus Stretch Right;2 reps;30 seconds     Knee/Hip Exercises: Aerobic   Nustep L4: 4 min, L5: 2 min      Knee/Hip Exercises: Standing   Other Standing Knee Exercises standing Rt adductor stretch x 30 sec x 3 reps    Other Standing Knee Exercises Rt TKE with green band resistance behind knee x 12 reps      Knee/Hip Exercises: Supine   Straight Leg Raises AAROM;1 set  12   Straight Leg Raises Limitations unable to initiate / complete ROM without assist.      Knee/Hip Exercises: Prone   Straight Leg Raises Right;2 sets;15 reps     Ultrasound   Ultrasound Location Rt posterior hamstring, mid-distal portion then into attachment at fibular head    Ultrasound Parameters 100%, 1.1 w/cm2, 8 min    Ultrasound Goals Pain  tightness     Manual Therapy   Manual Therapy Myofascial release;Taping   Myofascial Release Rt lateral distal quad / hamstring/ ITB    Kinesiotex Create Space;Inhibit Muscle     Kinesiotix   Create Space 2 I lift strips applied to Rt anterior hip (medial to incision,  but not touching) to decrease pain, desensitize and decompress tissue.    Inhibit Muscle  I strip applied with 10-15% stretch distal to proximal to Rt lateral hamstring.  Lift strip applied perpendicular to I strip at fibular head. to decrease pain.                      PT Long Term Goals - 11/12/15 0938      PT LONG TERM GOAL #1   Title I with HEP 12/17/15   Time 6   Period Weeks   Status On-going     PT LONG TERM GOAL #2   Title Progress gait to least assistive device with good gait pattern 12/17/15   Time 6   Period Weeks   Status On-going     PT LONG TERM GOAL #3   Title report pain decrease =/> 75% with daily acitivities 12/17/15   Time 6   Period Weeks   Status On-going     PT LONG TERM GOAL #4   Title increase LE strength =/> 4+/5 to 5-/5 to allow return to function and  work 12/17/15   Time 6   Period Weeks   Status On-going     PT LONG TERM GOAL #5   Title improve FOTO =/< 48% limited  12/17/15   Time 6   Period Weeks   Status On-going               Plan - 11/18/15 1307    Clinical Impression Statement Pt required assistance with Rt SLR in supine due to decreased strength.  Pt had positive response with manual therapy, [place], and taping to Rt lateral hamstring; reported decreased tenderness /tightness at end of session. Pt demonstrated improved Rt quad flexibility this session.    Rehab Potential Good   PT Frequency 2x / week   PT Duration 6 weeks   PT Treatment/Interventions Patient/family education;ADLs/Self Care Home Management;Cryotherapy;Electrical Stimulation;Iontophoresis 4mg /ml Dexamethasone;Moist Heat;Ultrasound;Manual techniques;Dry needling;Therapeutic exercise;Therapeutic activities   PT Next Visit Plan Manual therapy / [place] to Rt hamstring, ITB.  Assess Trial of Rock tape.  Continue RLE strengthening as tolerated.    Consulted and Agree with Plan of Care Patient      Patient will benefit from skilled therapeutic intervention in order to improve the following deficits and impairments:  Postural dysfunction, Improper body mechanics, Pain, Abnormal gait, Decreased range of motion, Decreased mobility, Decreased strength, Decreased activity tolerance  Visit Diagnosis: Pain in right hip  Muscle weakness (generalized)  Other abnormalities of gait and mobility     Problem List Patient Active Problem List   Diagnosis Date Noted  . Tachycardia 11/06/2015  . Mood disorder (HCC) 11/06/2015  . History of arthroplasty of right hip 12/07/2014  . Excessive daytime sleepiness 11/06/2014  . Primary osteoarthritis of left knee with lateral meniscal tear postarthroscopy 09/21/2014  . Insomnia 09/21/2014  . Erectile dysfunction 09/21/2014  . Hyperlipidemia LDL goal < 100 04/29/2012  . Left lumbar radiculopathy 04/29/2012  . Hypogonadism  male 04/29/2012  . Radiculitis of left cervical region 04/01/2012   Mayer Camel, PTA 11/18/15 1:15 PM  [facility] [place] [address] [place], [place], [address] Phone: [phone]   Fax:  [phone]  Name: Jerry Shannon MRN: [id number] Date of Birth: May 16, 1970

## 2015-11-21 ENCOUNTER — Ambulatory Visit (INDEPENDENT_AMBULATORY_CARE_PROVIDER_SITE_OTHER): Payer: Managed Care, Other (non HMO) | Admitting: Physical Therapy

## 2015-11-21 DIAGNOSIS — M25551 Pain in right hip: Secondary | ICD-10-CM | POA: Diagnosis not present

## 2015-11-21 DIAGNOSIS — R2689 Other abnormalities of gait and mobility: Secondary | ICD-10-CM | POA: Diagnosis not present

## 2015-11-21 DIAGNOSIS — M6281 Muscle weakness (generalized): Secondary | ICD-10-CM | POA: Diagnosis not present

## 2015-11-21 NOTE — Therapy (Signed)
[facility] [place] [address] [place], [place], [address] Phone: [phone]   Fax:  [phone]  Physical Therapy Treatment  Patient Details  Name: Jerry Shannon MRN: [id number] Date of Birth: 1971/01/17 Referring Provider: Dr. Davene Costain   Encounter Date: 11/21/2015      PT End of Session - 11/21/15 0847    Visit Number 6   Number of Visits 12   Date for PT Re-Evaluation 12/17/15   PT Start Time 0846   PT Stop Time 0928   PT Time Calculation (min) 42 min   Activity Tolerance Patient limited by pain      Past Medical History:  Diagnosis Date  . Back injury   . GERD (gastroesophageal reflux disease)   . History of hiatal hernia     Past Surgical History:  Procedure Laterality Date  . CHONDROPLASTY Left 11/07/2014   Procedure: CHONDROPLASTY;  Surgeon: Jodi Geralds, MD;  Location: [facility];  Service: Orthopedics;  Laterality: Left;  . HERNIA REPAIR    . KNEE ARTHROSCOPY WITH LATERAL MENISECTOMY Left 11/07/2014   Procedure: Knee arthroscopy with lateral menisectomy, with lateral plica;  Surgeon: Jodi Geralds, MD;  Location: [facility];  Service: Orthopedics;  Laterality: Left;  . KNEE SURGERY      There were no vitals filed for this visit.      Subjective Assessment - 11/21/15 0848    Subjective Pt is alittle sore today, he slept on his Rt side, using ice. Starts back work on Monday, full time, [place] without cane today   Patient Stated Goals per MD order - got to get back to work - get off walker   Currently in Pain? Yes   Pain Score 1    Pain Location Hip   Pain Orientation Right;Lateral   Pain Descriptors / Indicators Aching   Pain Type Chronic pain   Aggravating Factors  sleeping on the rt side   Pain Relieving Factors rest, stretching             [facility] PT Assessment - 11/21/15 0001      Assessment   Medical Diagnosis Rt total hip arthroplasty     Flexibility   Hamstrings  Rt 50 deg           [facility] Adult PT Treatment/Exercise - 11/21/15 0001      Knee/Hip Exercises: Stretches   Passive Hamstring Stretch Right;30 seconds;4 reps   Quad Stretch Right;3 reps;30 seconds   Gastroc Stretch Right;2 reps;30 seconds     Knee/Hip Exercises: Aerobic   Nustep L6x5'     Knee/Hip Exercises: Standing   Lateral Step Up Right;Hand Hold: 1;Step Height: 4";1 set;10 reps  stopped due to pain in posterior knee      Knee/Hip Exercises: Supine   Straight Leg Raises Right;1 set;5 reps  from leg over small black bolster (without AAROM)     Ultrasound   Ultrasound Location Rt posterior knee/HS   Ultrasound Parameters 100%, 1.1 w/cm2, 1.15mHz   Ultrasound Goals Pain  tightness     Manual Therapy   Manual Therapy Soft tissue mobilization   Soft tissue mobilization Edge tool assistance to Rt quad, Rt hamstring to decrease fascial restrictions, improve ROM/ flexibility and decrease pain.  Muscle stripping to Rt hamstring (midbelly).  TPR to same area.     Kinesiotex --  skin irritation noted where tape was removed Rt post LE            PT Long  Term Goals - 11/12/15 0938      PT LONG TERM GOAL #1   Title I with HEP 12/17/15   Time 6   Period Weeks   Status On-going     PT LONG TERM GOAL #2   Title Progress gait to least assistive device with good gait pattern 12/17/15   Time 6   Period Weeks   Status On-going     PT LONG TERM GOAL #3   Title report pain decrease =/> 75% with daily acitivities 12/17/15   Time 6   Period Weeks   Status On-going     PT LONG TERM GOAL #4   Title increase LE strength =/> 4+/5 to 5-/5 to allow return to function and work 12/17/15   Time 6   Period Weeks   Status On-going     PT LONG TERM GOAL #5   Title improve FOTO =/< 48% limited  12/17/15   Time 6   Period Weeks   Status On-going               Plan - 11/21/15 0935    Clinical Impression Statement Pt ambulated into therapy without use of AD, with antalgic  gait. Pt limited with ther ex due to pain in Rt posterior knee with WB exercises.  Notable tightness in Rt hamstring; reduce with US and manual therapy.   Improved ability to complete SLR today and improved Rt hamstring flexibility by 10 deg.    Rehab Potential Good   PT Frequency 2x / week   PT Duration 6 weeks   PT Treatment/Interventions Patient/family education;ADLs/Self Care Home Management;Cryotherapy;Electrical Stimulation;Iontophoresis 4mg /ml Dexamethasone;Moist Heat;Ultrasound;Manual techniques;Dry needling;Therapeutic exercise;Therapeutic activities   PT Next Visit Plan Manual therapy / US to Rt hamstring, ITB.  Assess Trial of ionto to post Rt knee.  Continue RLE strengthening as tolerated.    Consulted and Agree with Plan of Care Patient      Patient will benefit from skilled therapeutic intervention in order to improve the following deficits and impairments:  Postural dysfunction, Improper body mechanics, Pain, Abnormal gait, Decreased range of motion, Decreased mobility, Decreased strength, Decreased activity tolerance  Visit Diagnosis: Pain in right hip  Muscle weakness (generalized)  Other abnormalities of gait and mobility     Problem List Patient Active Problem List   Diagnosis Date Noted  . Tachycardia 11/06/2015  . Mood disorder (HCC) 11/06/2015  . History of arthroplasty of right hip 12/07/2014  . Excessive daytime sleepiness 11/06/2014  . Primary osteoarthritis of left knee with lateral meniscal tear postarthroscopy 09/21/2014  . Insomnia 09/21/2014  . Erectile dysfunction 09/21/2014  . Hyperlipidemia LDL goal < 100 04/29/2012  . Left lumbar radiculopathy 04/29/2012  . Hypogonadism male 04/29/2012  . Radiculitis of left cervical region 04/01/2012   Mayer CamelJennifer Carlson-Long, PTA 11/21/15 9:39 AM  [facility] [address] [place], [place], [address] Phone: [phone]   Fax:  [phone]  Name:  Jerry FenderLarry Shannon MRN: [id number] Date of Birth: 07-05-1970

## 2015-11-26 ENCOUNTER — Ambulatory Visit (INDEPENDENT_AMBULATORY_CARE_PROVIDER_SITE_OTHER): Payer: Managed Care, Other (non HMO) | Admitting: Licensed Clinical Social Worker

## 2015-11-26 ENCOUNTER — Encounter (HOSPITAL_COMMUNITY): Payer: Self-pay | Admitting: Licensed Clinical Social Worker

## 2015-11-26 DIAGNOSIS — F39 Unspecified mood [affective] disorder: Secondary | ICD-10-CM | POA: Diagnosis not present

## 2015-11-26 DIAGNOSIS — F1020 Alcohol dependence, uncomplicated: Secondary | ICD-10-CM | POA: Diagnosis not present
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
## 2015-11-27 ENCOUNTER — Encounter: Payer: Self-pay | Admitting: Rehabilitative and Restorative Service Providers"

## 2015-11-27 ENCOUNTER — Ambulatory Visit (INDEPENDENT_AMBULATORY_CARE_PROVIDER_SITE_OTHER): Payer: Managed Care, Other (non HMO) | Admitting: Rehabilitative and Restorative Service Providers"

## 2015-11-27 DIAGNOSIS — R29898 Other symptoms and signs involving the musculoskeletal system: Secondary | ICD-10-CM

## 2015-11-27 DIAGNOSIS — M6281 Muscle weakness (generalized): Secondary | ICD-10-CM

## 2015-11-27 DIAGNOSIS — M25551 Pain in right hip: Secondary | ICD-10-CM | POA: Diagnosis not present

## 2015-11-27 DIAGNOSIS — R2689 Other abnormalities of gait and mobility: Secondary | ICD-10-CM

## 2015-11-27 NOTE — Therapy (Signed)
[facility] [address] [place], [place], [address] Phone: [phone]   Fax:  [phone]  Physical Therapy Treatment  Patient Details  Name: Jayten Gabbard MRN: [id number] Date of Birth: 1970/04/29 Referring Provider: Dr Davene Costain   Encounter Date: 11/27/2015      PT End of Session - 11/27/15 0806    Visit Number 7   Number of Visits 12   Date for PT Re-Evaluation 12/17/15   PT Start Time 0750   PT Stop Time 0837   PT Time Calculation (min) 47 min   Activity Tolerance Patient tolerated treatment well      Past Medical History:  Diagnosis Date  . Back injury   . GERD (gastroesophageal reflux disease)   . History of hiatal hernia     Past Surgical History:  Procedure Laterality Date  . CHONDROPLASTY Left 11/07/2014   Procedure: CHONDROPLASTY;  Surgeon: Jodi Geralds, MD;  Location: [facility];  Service: Orthopedics;  Laterality: Left;  . KNEE ARTHROSCOPY WITH LATERAL MENISECTOMY Left 11/07/2014   Procedure: Knee arthroscopy with lateral menisectomy, with lateral plica;  Surgeon: Jodi Geralds, MD;  Location: [facility];  Service: Orthopedics;  Laterality: Left;  . KNEE SURGERY      There were no vitals filed for this visit.      Subjective Assessment - 11/27/15 0812    Subjective Awoke Tuesday with significant increased pain in the Rt quad. Could hardly walk. Now using the cane again and has not used the cane in at least 2-3 weeks. not sure of anything he did differently - did start to work Monday - more sitting - did carry computer bag(weighing 60#) not sure of anything he did to increase the quad pain and tightness. Feels 90% better following today's treatment.    Currently in Pain? Yes   Pain Score 4    Pain Location Leg   Pain Orientation Right  quad   Pain Descriptors / Indicators Tightness;Shooting   Pain Type Acute pain   Pain Onset More than a month ago   Pain Frequency  Intermittent            [facility] PT Assessment - 11/27/15 0001      Assessment   Medical Diagnosis Rt total hip arthroplasty   Referring Provider Dr Davene Costain    Onset Date/Surgical Date 10/09/15   Hand Dominance Right   Next MD Visit 12/12/15     Flexibility   Hamstrings Rt 70 deg    Quadriceps Rt knee 126                     [facility] Adult PT Treatment/Exercise - 11/27/15 0001      Self-Care   Self-Care --  importance of frequent stretching      Therapeutic Activites    Therapeutic Activities --  self massage with stick     Knee/Hip Exercises: Stretches   Passive Hamstring Stretch Right;30 seconds;3 reps   Quad Stretch Right;3 reps;30 seconds;5 reps  before and after [place] to quad    [facility] Limitations added quad stretch standing 30 sec x 2      Knee/Hip Exercises: Aerobic   Nustep L4 x 7'     Ultrasound   Ultrasound Location Rt mid quad    Ultrasound Parameters 100%; 1 mHz; 1.5 w/cm2; 8 min    Ultrasound Goals Pain  tightness     Manual Therapy   Manual Therapy Soft tissue mobilization  Soft tissue mobilization Edge tool assistance to Rt quad to decrease fascial restrictions, improve ROM/ flexibility and decrease pain.  Muscle stripping to Rt quad (midbelly).  TPR to same area.     Myofascial Release Rt quad                 PT Education - 11/27/15 0845    Education provided Yes   Education Details importance of stretching. Added standing quad stretch work through the [employer]-Plought quad with Science writerstick   Person(s) Educated Patient   Methods Explanation;Demonstration;Tactile cues;Verbal cues;Handout   Comprehension Verbalized understanding;Returned demonstration;Verbal cues required;Tactile cues required             PT Long Term Goals - 11/27/15 0849      PT LONG TERM GOAL #1   Title I with HEP 12/17/15   Time 6   Period Weeks   Status On-going     PT LONG TERM GOAL #2   Title Progress gait to least assistive device with good gait pattern  12/17/15   Time 6   Period Weeks   Status On-going     PT LONG TERM GOAL #3   Title report pain decrease =/> 75% with daily acitivities 12/17/15   Time 6   Period Weeks   Status On-going     PT LONG TERM GOAL #4   Title increase LE strength =/> 4+/5 to 5-/5 to allow return to function and work 12/17/15   Time 6   Period Weeks   Status On-going     PT LONG TERM GOAL #5   Title improve FOTO =/< 48% limited  12/17/15   Time 6   Period Weeks   Status On-going               Plan - 11/27/15 0847    Clinical Impression Statement Significant increase in Rt anterior thigh pain - muscular in nature - likely related to activity level. Continued gains in mobility noted. Responded well to stretching/US/manual work with 90% improvement in pain. Improved gait pattern.     Rehab Potential Good   PT Frequency 2x / week   PT Duration 6 weeks   PT Treatment/Interventions Patient/family education;ADLs/Self Care Home Management;Cryotherapy;Electrical Stimulation;Iontophoresis 4mg /ml Dexamethasone;Moist Heat;Ultrasound;Manual techniques;Dry needling;Therapeutic exercise;Therapeutic activities   PT Next Visit Plan Manual therapy / US to Rt hamstring, ITB.  Assess Trial of ionto to post Rt knee.  Continue RLE strengthening as tolerated.    Consulted and Agree with Plan of Care Patient      Patient will benefit from skilled therapeutic intervention in order to improve the following deficits and impairments:  Postural dysfunction, Improper body mechanics, Pain, Abnormal gait, Decreased range of motion, Decreased mobility, Decreased strength, Decreased activity tolerance  Visit Diagnosis: Pain in right hip  Muscle weakness (generalized)  Other abnormalities of gait and mobility  Other symptoms and signs involving the musculoskeletal system     Problem List Patient Active Problem List   Diagnosis Date Noted  . Tachycardia 11/06/2015  . Mood disorder (HCC) 11/06/2015  . History of  arthroplasty of right hip 12/07/2014  . Excessive daytime sleepiness 11/06/2014  . Primary osteoarthritis of left knee with lateral meniscal tear postarthroscopy 09/21/2014  . Insomnia 09/21/2014  . Erectile dysfunction 09/21/2014  . Hyperlipidemia LDL goal < 100 04/29/2012  . Left lumbar radiculopathy 04/29/2012  . Hypogonadism male 04/29/2012  . Radiculitis of left cervical region 04/01/2012    Celyn Rober MinionP Holt PT, MPH  11/27/2015, 8:51 AM  [facility] [address] [place], [place], [address] Phone: [phone]   Fax:  [phone]  Name: Michah Minton MRN: [id number] Date of Birth: Dec 08, 1970

## 2015-11-29 ENCOUNTER — Ambulatory Visit (INDEPENDENT_AMBULATORY_CARE_PROVIDER_SITE_OTHER): Payer: Managed Care, Other (non HMO) | Admitting: Rehabilitative and Restorative Service Providers"

## 2015-11-29 ENCOUNTER — Encounter: Payer: Self-pay | Admitting: Rehabilitative and Restorative Service Providers"

## 2015-11-29 DIAGNOSIS — M25551 Pain in right hip: Secondary | ICD-10-CM

## 2015-11-29 DIAGNOSIS — R29898 Other symptoms and signs involving the musculoskeletal system: Secondary | ICD-10-CM

## 2015-11-29 DIAGNOSIS — R2689 Other abnormalities of gait and mobility: Secondary | ICD-10-CM

## 2015-11-29 DIAGNOSIS — M6281 Muscle weakness (generalized): Secondary | ICD-10-CM | POA: Diagnosis not present

## 2015-11-29 NOTE — Therapy (Addendum)
[facility] [address] [place], [place], [address] Phone: [phone]   Fax:  [phone]  Physical Therapy Treatment  Patient Details  Name: Jerry Shannon MRN: [id number] Date of Birth: 05/14/1970 Referring Provider: Dr Queen Slough   Encounter Date: 11/29/2015      PT End of Session - 11/29/15 0854    Visit Number 8   Number of Visits 12   Date for PT Re-Evaluation 12/17/15   PT Start Time 0752   PT Stop Time 0839   PT Time Calculation (min) 47 min   Activity Tolerance Patient tolerated treatment well      Past Medical History:  Diagnosis Date  . Back injury   . GERD (gastroesophageal reflux disease)   . History of hiatal hernia     Past Surgical History:  Procedure Laterality Date  . CHONDROPLASTY Left 11/07/2014   Procedure: CHONDROPLASTY;  Surgeon: Dorna Leitz, MD;  Location: [place];  Service: Orthopedics;  Laterality: Left;  . KNEE ARTHROSCOPY WITH LATERAL MENISECTOMY Left 11/07/2014   Procedure: Knee arthroscopy with lateral menisectomy, with lateral plica;  Surgeon: Dorna Leitz, MD;  Location: [place];  Service: Orthopedics;  Laterality: Left;  . KNEE SURGERY      There were no vitals filed for this visit.      Subjective Assessment - 11/29/15 0855    Subjective Much better after last treatment for a few hours - slowly tightened up again. working on stretches and pain is less intense. Still hurts to put weight on Rt LE. Pain is more inside in the thigh.    Currently in Pain? Yes   Pain Score 3    Pain Location Leg   Pain Orientation Right   Pain Descriptors / Indicators Tightness;Shooting   Pain Type Acute pain   Pain Onset More than a month ago   Pain Frequency Intermittent                         [facility] Adult PT Treatment/Exercise - 11/29/15 0001      Therapeutic Activites    Therapeutic Activities --  myofacial ball release  hamstrings/adductors/quads      Knee/Hip Exercises: Stretches   Passive Hamstring Stretch Right;30 seconds;3 reps   Quad Stretch Right;3 reps;30 seconds;5 reps  before and after [place] to Publishing rights manager Limitations quad stretch standing 30 sec x 2    Hip Flexor Stretch 3 reps;30 seconds  PT assist   Hip Flexor Stretch Limitations sartorius x1 PT assist   Gastroc Stretch Right;2 reps;30 seconds     Knee/Hip Exercises: Aerobic   Nustep L4 x 7'     Knee/Hip Exercises: Supine   Other Supine Knee/Hip Exercises Rt LE resting on bolster - IR/ER      Knee/Hip Exercises: Sidelying   Other Sidelying Knee/Hip Exercises clam x 10 2 sets     Ultrasound   Ultrasound Location Rt mid to distal quad/medial hamstrings/adductors just proximal to knee   Ultrasound Parameters 199%; 1 mhz; 1.5 w/cm2; 8 min    Ultrasound Goals Pain  tightness     Manual Therapy   Manual Therapy Soft tissue mobilization   Soft tissue mobilization Edge tool assistance to Rt quad hip adductors to decrease fascial restrictions, improve ROM/ flexibility and decrease pain.  Muscle stripping to Rt quad (midbelly).  TPR to same area.     Myofascial Release Rt quad adductors/medial hamstrings  PT Education - 11/29/15 0931    Education provided Yes   Education Details myofacial ball releae supine; sitting; standing - hamstrings/hip adductors/quads    Person(s) Educated Patient   Methods Explanation;Demonstration;Verbal cues;Tactile cues   Comprehension Verbalized understanding;Returned demonstration;Verbal cues required;Tactile cues required             PT Long Term Goals - 11/27/15 0849      PT LONG TERM GOAL #1   Title I with HEP 12/17/15   Time 6   Period Weeks   Status On-going     PT LONG TERM GOAL #2   Title Progress gait to least assistive device with good gait pattern 12/17/15   Time 6   Period Weeks   Status On-going     PT LONG TERM GOAL #3   Title report pain decrease  =/> 75% with daily acitivities 12/17/15   Time 6   Period Weeks   Status On-going     PT LONG TERM GOAL #4   Title increase LE strength =/> 4+/5 to 5-/5 to allow return to function and work 12/17/15   Time 6   Period Weeks   Status On-going     PT LONG TERM GOAL #5   Title improve FOTO =/< 48% limited  12/17/15   Time 6   Period Weeks   Status On-going               Plan - 11/29/15 0932    Clinical Impression Statement Improvement with last treatment. Continued muscular tightness and pain noted through the quads which is resolving - more noted through the hip adductors and medial quads. Responds well to stretching; [place]; manual work and ball release work. Pt will be out of town next week and unable to come for PT., Will continue the week of 12/09/15   Rehab Potential Good   PT Frequency 2x / week   PT Duration 6 weeks   PT Treatment/Interventions Patient/family education;ADLs/Self Care Home Management;Cryotherapy;Electrical Stimulation;Iontophoresis 26m/ml Dexamethasone;Moist Heat;Ultrasound;Manual techniques;Dry needling;Therapeutic exercise;Therapeutic activities   PT Next Visit Plan Manual therapy / [place] Rt hamstring, ITB.  Assess Trial of ionto to post Rt knee.  Continue RLE strengthening as tolerated.    Consulted and Agree with Plan of Care Patient      Patient will benefit from skilled therapeutic intervention in order to improve the following deficits and impairments:  Postural dysfunction, Improper body mechanics, Pain, Abnormal gait, Decreased range of motion, Decreased mobility, Decreased strength, Decreased activity tolerance  Visit Diagnosis: Pain in right hip  Muscle weakness (generalized)  Other abnormalities of gait and mobility  Other symptoms and signs involving the musculoskeletal system     Problem List Patient Active Problem List   Diagnosis Date Noted  . Tachycardia 11/06/2015  . Mood disorder ([place] 11/06/2015  . History of arthroplasty of  right hip 12/07/2014  . Excessive daytime sleepiness 11/06/2014  . Primary osteoarthritis of left knee with lateral meniscal tear postarthroscopy 09/21/2014  . Insomnia 09/21/2014  . Erectile dysfunction 09/21/2014  . Hyperlipidemia LDL goal < 100 04/29/2012  . Left lumbar radiculopathy 04/29/2012  . Hypogonadism male 04/29/2012  . Radiculitis of left cervical region 04/01/2012    Cherylann Hobday PNilda SimmerPT, MPH  11/29/2015, 9:35 AM  [facility] [address]Phone: [phone]  Fax:  [phone] Name: Jerry McnayMRN: [id number] of Birth: 1Jul 26, 1972 PHYSICAL THERAPY DISCHARGE SUMMARY  [facility]  Current functional level related to goals / functional outcomes: Gradual progress during rehab   Remaining deficits: Unknown    Education / Equipment: HEP Plan: Patient agrees to discharge.  Patient goals were partially met. Patient is being discharged due to not returning since the last visit.  ?????     Shalyn Koral P. Helene Kelp PT, MPH 02/04/16 10:08 AM

## 2015-12-09 ENCOUNTER — Encounter: Payer: Self-pay | Admitting: Rehabilitative and Restorative Service Providers"

## 2015-12-13 ENCOUNTER — Encounter: Payer: Self-pay | Admitting: Physical Therapy

## 2015-12-16 ENCOUNTER — Ambulatory Visit (INDEPENDENT_AMBULATORY_CARE_PROVIDER_SITE_OTHER): Payer: Managed Care, Other (non HMO) | Admitting: Licensed Clinical Social Worker

## 2015-12-16 DIAGNOSIS — F39 Unspecified mood [affective] disorder: Secondary | ICD-10-CM | POA: Diagnosis not present

## 2015-12-16 DIAGNOSIS — F1020 Alcohol dependence, uncomplicated: Secondary | ICD-10-CM

## 2015-12-16 DIAGNOSIS — F411 Generalized anxiety disorder: Secondary | ICD-10-CM

## 2015-12-16 NOTE — Progress Notes (Signed)
THERAPIST PROGRESS NOTE  Session Time: 8 AM to 8:55 AM  Participation Level: Active  Behavioral Response: CasualAlertDysphoric  Type of Therapy: Individual Therapy  Treatment Goals addressed:  stress management, work on relationship, learn and apply healthy coping and mood regulation, drug and alcohol counseling  Interventions: Motivational Interviewing, Solution Focused, Strength-based, Supportive and Other: Healthy relationship skills, drug and alcohol counseling  Summary: Jerry Shannon is a 45 y.o. male who presents with not doing too well. He split up with fiancee and drinking too much. Drinking liquor as well as beer. She kicked him out but he described aspect of the relationship that are negative. She didn't help him when he had a surgery. He is tired of yelling and screaming. She is always angry. She does not like that he is drinking when she is not. She pushes buttons and from a dysfuntional family. If things are overwhelming, she takes it out on him. They have had good chemistry and good intimate relationship and that is why it had been good but now she tears him apart. Last therapist and support was that he can't live for her. Not sure whether he wants to stay in the relationship but he does love her and would really upset him if he saw her was somebody else. He has allowed her issues and home to bring him down. He has been venting on Facebook and feels bad because this is not him. He relates that around her he is scared, yeling, and  Nervous. Drinking helps to relax him, get away, helps because legs and back bothering him and falls asleep. He recognizes that drinking has negative impact on his health, and doesn't help and functioning at work. He recognizes he has cut down on other activities because of drinking. Patient said he will work on cutting down drinking and to focus on steps he can take to take care of himself. Encouraged patient to review video to learn more about healthy  relationship skills but patient said he was unable to do this but would consider doing this after next session.  Suicidal/Homicidal: No  Therapist Response: Discussed healthy relationships and aspects of healthy relationship and question whether patient wants to be in relationship that can be abusive and needs not being met. Also learning relationship skills helps patient to be better aware of the skills that can  make this relationship healthier. Also, reviewed pros and cons of current relationship, what he wants from a relationship so patient can gain insight as to whether he wants to stay in the relationship.  Encouraged patient to focus on himself, taking care of himself and this shows he  recognizes his value as well as working on the person he wants to be. Pointed out this will be healthy for him and a relationship. Reviewed pros and cons of drinking, what drinking did for him as well as what problems it has caused in his life to help build motivation to change drinking habits. Encouraged patient to see ways his actions are not in line with his values, to help increase cognitive dissonance and to encourage actions that are more aligned with values. Help patient process emotions in session, provided supportive and strength-based interventions.  Plan: Return again in 1 week.2. Patient cut down and on drinking and apply healthier coping strategies to take care of self. 3. Patient gain better insight to healthy relationships and utilize healthy relationship skills to address current conflict in relationship  Diagnosis: Axis I:  mood disorder, alcohol use disorder,  moderate, dependence, generalized anxiety disorder    Axis II: No diagnosis    Chyanna Flock A, LCSW 12/16/2015

## 2015-12-23 ENCOUNTER — Ambulatory Visit (INDEPENDENT_AMBULATORY_CARE_PROVIDER_SITE_OTHER): Payer: Managed Care, Other (non HMO) | Admitting: Licensed Clinical Social Worker

## 2015-12-23 DIAGNOSIS — F39 Unspecified mood [affective] disorder: Secondary | ICD-10-CM | POA: Diagnosis not present

## 2015-12-23 DIAGNOSIS — F411 Generalized anxiety disorder: Secondary | ICD-10-CM | POA: Diagnosis not present

## 2015-12-23 DIAGNOSIS — F1020 Alcohol dependence, uncomplicated: Secondary | ICD-10-CM

## 2015-12-23 NOTE — Progress Notes (Signed)
THERAPIST PROGRESS NOTE  Session Time: 8 AM to 8:57 AM  Participation Level: Active  Behavioral Response: CasualAlertEuthymic  Type of Therapy: Individual Therapy  Treatment Goals addressed:  tress management, work on relationship, learn and apply healthy coping and mood regulation, drug and alcohol counseling  Interventions: DBT, Solution Focused, Strength-based, Supportive, Reframing and Other: Relationship issues, grief and loss issues  Summary: Jerry Shannon is a 45 y.o. male who presents with feeling better. He has not had an appetite but is making efforts to eat to be healthy such as taking Ensure  and he had an appetite yesterday. He wishes his job would rev up so he wouldn't be so anxious. He identified core issues for him for therapy is grief and loss with mid-life crisis. He explained his body is broken, hip replacement, three knee knee surgeries, 5 ruptured discs. He used to be in great shape. He can't burn three thousand calaries a day. He has a sedentary life mostly, career change, pain, mid life crisis, and break up of relationship. Disscussed pattern of drinking and that it helps. It helps to relax and sleep. He has cut down on drinking by eliminating liquor except for one mixed drink. He does want to work on healthier ways to cope and has taken some steps. He still drinks several drinks and says he drinks slowly over the course of the day. Herelates what is really helped him as he has communicated with wife. She admitted that she is part of the problem. Discussed that both of them have developed unhealthy interpersonal schemas. She doesn't want him to drink because of his health. Her worries turn into angry. She doesn't want him to drink a beer unless he has a glass of wine. He feels he needs beer to help with anxiety and pain. When pain decreases he will drink less beer. He feels there are negative consequences with taking medication and not ready to give up the beer which will make  it worse. He started training and run into problems. He is willing to take walks during the day and take healing at a slow pace. Meds hurt more than helped. Discussed things he can do that will create positive experiences and these experiences help create positive mood. He enjoys cooking, has cut off his social media because of problems he was having. Racing thoughts better since connected more with family. He is working on healing, communication, be positive and stay off of sociation media.     Suicidal/Homicidal: No  Therapist Response: Provided positive feedback for patient taking positive steps to cope with things in healthier ways and his decrease in and drinking. Discussed that we will be working on developing healthy processes to find alternatives to drinking that are healthier and more effective to symptoms. Identified grief and loss a significant issues for patient in terms not working at prestigious job due to physical issues, and physical problems have affected his ability to keep in shape and do the training he used to do. Asked how he felt about himself. Therapist explored possibility of perfectionists thinking. Explained to patient that healthy coping in life is adjusting to change and that changes a lifelong experience. Talked about how patient can slow the pace down and still finds ways that provide for fulfillment. Explained that injury does not find him and he has the power to define himself. Identified resilience and strength developed through loss and that there is meaning and purpose beyond the loss. Explored things in life the patient  denies that can bring him joy. Reinforce taking things slow in the healing process and provided positive feedback for patient willing to take walks during breaks at his work. Identified patient has developed unhealthy interpersonal schemas but also positive feedback for him working on becoming healthier. Encouraged patient to not speed up process of healing.   Provided strength-based, supportive interventions and help patient to process feelings and session.   Plan: Return again in 1 weeks.2. Patient gain insight and apply healthier coping strategies to manage anxiety and stressors.3. Patient learn coping strategies to help adjust to changes in his life.4. Therapist use motivational strategies to help patient gain insight to healthy coping strategies.  Diagnosis: Axis I:  mood disorder, alcohol use disorder, moderate, dependence, generalized anxiety disorder    Axis II: No diagnosis    Bowman,Mary A, LCSW 12/23/2015

## 2016-01-06 ENCOUNTER — Ambulatory Visit (INDEPENDENT_AMBULATORY_CARE_PROVIDER_SITE_OTHER): Payer: Managed Care, Other (non HMO) | Admitting: Licensed Clinical Social Worker

## 2016-01-06 DIAGNOSIS — F39 Unspecified mood [affective] disorder: Secondary | ICD-10-CM

## 2016-01-06 DIAGNOSIS — F411 Generalized anxiety disorder: Secondary | ICD-10-CM

## 2016-01-06 DIAGNOSIS — F1020 Alcohol dependence, uncomplicated: Secondary | ICD-10-CM

## 2016-01-06 NOTE — Progress Notes (Signed)
   THERAPIST PROGRESS NOTE  Session Time: 9:05 AM to 9:55 AM  Participation Level: Active  Behavioral Response: CasualAlertEuthymic  Type of Therapy: Individual Therapy I  Treatment Goals addressed:   Identified treatment goals as cutting down drinking, being healthier mentally and physically, work on relationship, and decrease anxiety.  . Interventions: Solution Focused, Strength-based, Supportive and Other: Goals to create a healthier lifestyle, relationship skills  Summary: Jerry FenderLarry Shannon is a 45 y.o. male who presents with reporting that he continues to make positive steps to move forward. He related recent episode of working out where she caused cysts severe pain and difficulty with moving. He had to use his cane again. It humbled him as, the pain was as bad as after he came out of surgery when he came out of surgery. It humbled him because he realizes that he could break his which would impact his mobility and also his functioning and general. It helps him see the need to take things slow in terms of his physical recuperation. He is back together with his wife. He describes this as helpful for emotional and financial reasons but that there are still problems in this relationship as she sparks easily, and gets angry and both are willing to work on themselves and the relationship. Feels that he is the one who to take her verbal outbursts and he doesn't argue back. He recognizes when she is having her episodes including key words and lays look in her eyes that helps him not to react. He just got to the point where it got to be too much with his other stressors. He believes that is why he vented on social media. He has cut down on drinking significantly and has started to be a little more open about the amounts he is drinking with therapist. He is eating healthier and push himself to drink Ensure in the morning and eating three times a day. He is off social media. He feels that the relationship has  damaged him, she triggers quick, not as social, and not the person he  used to be. She gets agitated quickly and he she is trying to work on that and he is trying to cut down on drinking, and be active around the house.  Identified treatment goals as cutting down drinking, being healthier mentally and physically, work on relationship, and decrease anxiety.  .   Suicidal/Homicidal: no  Therapist Response: Reviewed progress and symptoms. Use motivational interviewing to help patient identify that as he is cut down on drinking and being healthier that he will continue to want to decrease that this will continue to help motivate him to make positive changes. Provided positive feedback to reinforce positive steps patient has taken to encourage continued progress. Discussed that patient needs to work with his wife on healthy relationships skills to help make the home environment better and not a trigger for negative emotions. Discussed anger management strategies and worked reframing to help patient manage slow recuperation from surgery and changes that have happened in his life. Provided supportive and strength-based interventions.   Plan: Return again in 4 weeks.2. Patient continue to gain insight to effective coping strategies and sessions and take steps to create a healthier lifestyle  Diagnosis: Axis I:   mood disorder, alcohol use disorder, moderate, dependence, generalized anxiety disorder    Axis II: No diagnosis    Bowman,Mary A, LCSW 01/06/2016

## 2016-01-20 ENCOUNTER — Ambulatory Visit (HOSPITAL_COMMUNITY): Payer: Self-pay | Admitting: Licensed Clinical Social Worker

## 2016-03-13 ENCOUNTER — Encounter: Payer: Self-pay | Admitting: Sports Medicine

## 2016-03-13 ENCOUNTER — Ambulatory Visit (INDEPENDENT_AMBULATORY_CARE_PROVIDER_SITE_OTHER): Payer: 59 | Admitting: Sports Medicine

## 2016-03-13 DIAGNOSIS — F1010 Alcohol abuse, uncomplicated: Secondary | ICD-10-CM | POA: Insufficient documentation

## 2016-03-13 DIAGNOSIS — F39 Unspecified mood [affective] disorder: Secondary | ICD-10-CM

## 2016-03-13 MED ORDER — BUSPIRONE HCL 7.5 MG PO TABS: 7.5000 mg | ORAL_TABLET | Freq: Two times a day (BID) | ORAL | 3 refills | Status: DC

## 2016-03-13 NOTE — Progress Notes (Signed)
  Subjective:    CC: alcohol abuse  HPI: For many years this pleasant 46 year old male has consumed excessive amounts of alcohol to the tune of 10-12 beers per day. He feels anxious when he doesn't drink, becomes tachycardic, hypertensive. We have discussed getting help for some time now but he is finally agreeable to proceed. No suicidal or homicidal ideation. He is simply anxious, she has severe difficulty sleeping, poor energy, overeating, mild anhedonia, depressed mood, guilt, difficulty concentrating, psychomotor retardation, severe nervousness, difficulty controlling his worry, worrying about different things, difficulty relaxing, restlessness and fear of impending doom.  Past medical history:  Negative.  See flowsheet/record as well for more information.  Surgical history: Negative.  See flowsheet/record as well for more information.  Family history: Negative.  See flowsheet/record as well for more information.  Social history: Negative.  See flowsheet/record as well for more information.  Allergies, and medications have been entered into the medical record, reviewed, and no changes needed.   Review of Systems: No fevers, chills, night sweats, weight loss, chest pain, or shortness of breath.   Objective:    General: Well Developed, well nourished, and in no acute distress.  Neuro: Alert and oriented x3, extra-ocular muscles intact, sensation grossly intact.  HEENT: Normocephalic, atraumatic, pupils equal round reactive to light, neck supple, no masses, no lymphadenopathy, thyroid nonpalpable.  Skin: Warm and dry, no rashes. Cardiac: Regular rate and rhythm, no murmurs rubs or gallops, no lower extremity edema.  Respiratory: Clear to auscultation bilaterally. Not using accessory muscles, speaking in full sentences.  Impression and Recommendations:    Alcohol abuse Needs intensive outpatient rehabilitation. Also adding buspirone. We'll avoid all controlled substances. He also  severe substance-induced anxiety. Return to see me for a GAD7 PHQ9 in one month and we can augment the dose of BuSpar as needed. Patient declines use of disulfiram or antidepressants.  I spent 25 minutes with this patient, greater than 50% was face-to-face time counseling regarding the above diagnoses

## 2016-03-13 NOTE — Assessment & Plan Note (Signed)
Needs intensive outpatient rehabilitation. Also adding buspirone. We'll avoid all controlled substances. He also severe substance-induced anxiety. Return to see me for a GAD7 PHQ9 in one month and we can augment the dose of BuSpar as needed. Patient declines use of disulfiram or antidepressants.

## 2016-03-19 ENCOUNTER — Telehealth: Payer: Self-pay

## 2016-03-19 NOTE — Telephone Encounter (Signed)
Pt left VM stating the medications are working well and he's gone 5 days without drinking.

## 2016-03-20 ENCOUNTER — Telehealth: Payer: Self-pay

## 2016-03-20 ENCOUNTER — Telehealth (HOSPITAL_COMMUNITY): Payer: Self-pay | Admitting: Licensed Clinical Social Worker

## 2016-03-20 DIAGNOSIS — F1010 Alcohol abuse, uncomplicated: Secondary | ICD-10-CM

## 2016-03-20 NOTE — Telephone Encounter (Signed)
9:11AM Counselor spoke with pt. About seeking entry into [facility]. Patient stated group time would not work due to his busy schedule. Patient stated he "mostly needed help with medications for anxiety, pain, and alcoholism". Counselor referred pt to Maryjean Mornharles Kober, PA [facility]. Pt agreed and verbalized intent to make apt with Eloisa NorthernKober asap.

## 2016-03-20 NOTE — Telephone Encounter (Signed)
Pt left VM stating the referral for therapy requires him to go 3 days a week for 2 hours. Pt states he will not be able to do this and keep his job. Would like to know what other possibilities are available. Please advise.

## 2016-03-23 NOTE — Telephone Encounter (Signed)
C3 referral placed 

## 2016-03-24 NOTE — Telephone Encounter (Signed)
Left VM

## 2016-04-03 ENCOUNTER — Encounter: Payer: Self-pay | Admitting: Sports Medicine

## 2016-04-03 ENCOUNTER — Ambulatory Visit (INDEPENDENT_AMBULATORY_CARE_PROVIDER_SITE_OTHER): Payer: 59 | Admitting: Sports Medicine

## 2016-04-03 DIAGNOSIS — F1010 Alcohol abuse, uncomplicated: Secondary | ICD-10-CM

## 2016-04-03 DIAGNOSIS — L219 Seborrheic dermatitis, unspecified: Secondary | ICD-10-CM

## 2016-04-03 DIAGNOSIS — F39 Unspecified mood [affective] disorder: Secondary | ICD-10-CM

## 2016-04-03 MED ORDER — BUSPIRONE HCL 15 MG PO TABS: 15.0000 mg | ORAL_TABLET | Freq: Every day | ORAL | 3 refills | Status: DC

## 2016-04-03 MED ORDER — KETOCONAZOLE 2 % EX SHAM: 1.0000 "application " | MEDICATED_SHAMPOO | CUTANEOUS | 11 refills | Status: DC

## 2016-04-03 NOTE — Assessment & Plan Note (Signed)
Root with 7.5 mg BuSpar once a day. Increasing to 15 mg at bedtime, he refused to take it during the day. Return in one month for a PHQ9 and GAD7.

## 2016-04-03 NOTE — Progress Notes (Signed)
  Subjective:    CC: Follow-up  HPI: Drug-induced anxiety: Improved significantly on 7.5 mg of BuSpar at night, only has mild difficulty sleeping, poor appetite, psychomotor retardation, mild nervousness, difficulty controlling his worry, worrying about different things, trouble relaxing, moderate restlessness, and only mild fear of impending doom. No suicidal or homicidal ideation.  Scalp rash: Present since childhood. Itchy, scaly.  Past medical history:  Negative.  See flowsheet/record as well for more information.  Surgical history: Negative.  See flowsheet/record as well for more information.  Family history: Negative.  See flowsheet/record as well for more information.  Social history: Negative.  See flowsheet/record as well for more information.  Allergies, and medications have been entered into the medical record, reviewed, and no changes needed.   Review of Systems: No fevers, chills, night sweats, weight loss, chest pain, or shortness of breath.   Objective:    General: Well Developed, well nourished, and in no acute distress.  Neuro: Alert and oriented x3, extra-ocular muscles intact, sensation grossly intact.  HEENT: Normocephalic, atraumatic, pupils equal round reactive to light, neck supple, no masses, no lymphadenopathy, thyroid nonpalpable.  Skin: Warm and dry, no rashes. Cardiac: Regular rate and rhythm, no murmurs rubs or gallops, no lower extremity edema.  Respiratory: Clear to auscultation bilaterally. Not using accessory muscles, speaking in full sentences.  Impression and Recommendations:    Mood disorder (HCC) Root with 7.5 mg BuSpar once a day. Increasing to 15 mg at bedtime, he refused to take it during the day. Return in one month for a PHQ9 and GAD7.  Alcohol abuse Is going to start counseling, has been abstinent for over 20 days.  Seborrheic dermatitis of scalp Adding ketoconazole shampoo.

## 2016-04-03 NOTE — Assessment & Plan Note (Signed)
Is going to start counseling, has been abstinent for over 20 days.

## 2016-04-03 NOTE — Assessment & Plan Note (Signed)
Adding ketoconazole shampoo.

## 2016-04-10 ENCOUNTER — Ambulatory Visit: Payer: Self-pay | Admitting: Sports Medicine

## 2016-04-14 ENCOUNTER — Telehealth: Payer: Self-pay | Admitting: Sports Medicine

## 2016-04-14 NOTE — Telephone Encounter (Signed)
Called patient about flu shot and Declined °

## 2016-04-15 NOTE — Telephone Encounter (Signed)
Documented

## 2016-04-17 ENCOUNTER — Telehealth: Payer: Self-pay | Admitting: Rehabilitative and Restorative Service Providers"

## 2016-04-17 NOTE — Telephone Encounter (Signed)
Discussed exercise limitations for gym. Suggested patient return to PT for appropriate recommendations on progression of gym program. Encouraged to avoid overuse/excessive weight/resistance/etc  Shulamis Wenberg P. Leonor LivHolt PT, MPH 04/17/16 12:54 PM

## 2016-04-20 ENCOUNTER — Telehealth: Payer: Self-pay

## 2016-04-20 DIAGNOSIS — M1991 Primary osteoarthritis, unspecified site: Secondary | ICD-10-CM

## 2016-04-20 NOTE — Telephone Encounter (Signed)
Done

## 2016-04-20 NOTE — Telephone Encounter (Signed)
Pt would like a referral back to PT for his knee and hip. Would like to see Fannie KneeSue for his appointments. Please assist.

## 2016-04-21 NOTE — Telephone Encounter (Signed)
Left VM

## 2016-04-22 ENCOUNTER — Ambulatory Visit (INDEPENDENT_AMBULATORY_CARE_PROVIDER_SITE_OTHER): Payer: PRIVATE HEALTH INSURANCE | Admitting: Physical Therapy

## 2016-04-22 DIAGNOSIS — M25662 Stiffness of left knee, not elsewhere classified: Secondary | ICD-10-CM

## 2016-04-22 DIAGNOSIS — M6281 Muscle weakness (generalized): Secondary | ICD-10-CM

## 2016-04-22 DIAGNOSIS — R29898 Other symptoms and signs involving the musculoskeletal system: Secondary | ICD-10-CM | POA: Diagnosis not present

## 2016-04-22 DIAGNOSIS — R2689 Other abnormalities of gait and mobility: Secondary | ICD-10-CM

## 2016-04-22 DIAGNOSIS — M25551 Pain in right hip: Secondary | ICD-10-CM | POA: Diagnosis not present

## 2016-04-22 NOTE — Patient Instructions (Addendum)
Knee-to-Chest Stretch: Unilateral    With hand behind right knee, pull knee in to chest until a comfortable stretch is felt in lower back and buttocks. Keep back relaxed. Hold _45___ seconds. Repeat __2__ times per set. Do __1__ sets per session. Do __1-2__ sessions per day. Repeat on the other leg.   Supine: Leg Stretch with Strap (Super Advanced)    Lie on back with one leg straight. Hook strap around other foot. Straighten knee. Raise leg to maximal stretch and straighten knee further by tightening quadriceps. Slowly press other leg down as close to floor as possible. Keep lower abdominals tight. Hold _45__ seconds. Warning: Intense stretch. Stay within tolerance. Repeat _2__ times per session. Do _1-2__ sessions per day. Repeat on the other leg.   Swing: Anterior / Posterior - stand tall    Standing on one leg, swing other leg forward and backward, under control, __10__ times. Repeat with other leg. Do __3__ sets per session. Do ___1_ session per day.  Trunk Flexion    Standing on one leg, bend forward from hips. Touch floor, then return, keeping back straight and leg bent. Hold each position ___1_ seconds. Repeat on other leg. Do __10-20__ repetitions, __1-2__ sets. Repeat on the other leg. Side Plank    From side, press up on one arm and side of foot. Extend other arm up. (Can do on elbow if needed )  Hold for __5-15 __ secs. Build up time of holds. Repeat on other side.

## 2016-04-22 NOTE — Therapy (Addendum)
[facility] [place], [place], [address] Phone: [phone]   Fax:  [phone]  Physical Therapy Evaluation  Patient Details  Name: Jerry Shannon MRN: [id number] Date of Birth: 28-Jun-1970 Referring Provider: Dr Dianah Field  Encounter Date: 04/22/2016      PT End of Session - 04/22/16 1443    Visit Number 1   Number of Visits 10   Date for PT Re-Evaluation 06/17/16   PT Start Time 2025   PT Stop Time 1520   PT Time Calculation (min) 48 min   Activity Tolerance Patient tolerated treatment well      Past Medical History:  Diagnosis Date  . Back injury   . GERD (gastroesophageal reflux disease)   . History of hiatal hernia     Past Surgical History:  Procedure Laterality Date  . CHONDROPLASTY Left 11/07/2014   Procedure: CHONDROPLASTY;  Surgeon: Dorna Leitz, MD;  Location: [facility];  Service: Orthopedics;  Laterality: Left;  . KNEE ARTHROSCOPY WITH LATERAL MENISECTOMY Left 11/07/2014   Procedure: Knee arthroscopy with lateral menisectomy, with lateral plica;  Surgeon: Dorna Leitz, MD;  Location: [facility];  Service: Orthopedics;  Laterality: Left;  . KNEE SURGERY      There were no vitals filed for this visit.       Subjective Assessment - 04/22/16 1433    Subjective Pt is now 6 months post Rt THA and he started doing more at the gym and he is not sure what exercises or how hard he can push himself.  He is also starting to have some Lt knee pain and feels like his femur is sliding out to the side.. Reports this happens at least 10 times a day, started this side after the Rt hip surgery.    Pertinent History 3 scopes in the Lt knee.    How long can you walk comfortably? no limitations   Diagnostic tests none recently   Patient Stated Goals learn what exercises he can do with the knee and hip.  tie his shoes, donn socks   Currently in Pain? No/denies  has pain with  strenuous exercise            [facility] PT Assessment - 04/22/16 0001      Assessment   Medical Diagnosis s/p Rt THA and Lt knee pain   Referring Provider Dr Dianah Field   Onset Date/Surgical Date 10/09/15   Next MD Visit 05/04/16   Prior Therapy yes     Precautions   Precautions None   Precaution Comments wearing compression sock Rt LE due to has pain when it is not on. Uses compression sleeve on the Rt thigh for excercise for comfort and stability.      Balance Screen   Has the patient fallen in the past 6 months No     Prior Function   Level of Independence Independent   Vocation Full time employment   [employer] logistics/support/administrative officer - desk job   Leisure workout, watch movies. , ride motorcycle     Observation/Other Assessments-Edema    Edema --  decreased scar mobility Rt hip distally      Functional Tests   Functional tests Squat;Single leg stance;Single Leg Squat     Squat   Comments bilat LE adduction, Lt LE IR      Single Leg Squat   Comments unable to perform on either leg Rt LE d/t pain and weakness, Lt weakness  Single Leg Stance   Comments Lt WNL, Rt good time, increased accessory motion      Posture/Postural Control   Posture/Postural Control Postural limitations   Postural Limitations --  valgus Lt LE      ROM / Strength   AROM / PROM / Strength AROM;Strength     AROM   Overall AROM Comments hips/knees/ankles WNL except Rt ankle DF 7 degrees   AROM Assessment Site Hip   Right/Left Hip Left;Right   Right Hip External Rotation  37   Right Hip Internal Rotation  30   Left Hip External Rotation  46   Left Hip Internal Rotation  12     Strength   Overall Strength Comments multifidi good   Strength Assessment Site Hip;Knee;Ankle   Right/Left Hip Right;Left   Right Hip Flexion 3+/5   Right Hip Extension 5/5   Right Hip ABduction 4-/5   Left Hip Flexion 5/5   Left Hip Extension 5/5   Left Hip ABduction 4/5   Right/Left Knee --   bilat 5/5 with break test   Right/Left Ankle --  bilat WNL with break test      Flexibility   Soft Tissue Assessment /Muscle Length yes   Hamstrings supine SLR Rt 54, Lt 40  SKTC Rt 80, Lt 110   Quadriceps prone knee flex Rt 122, Lt 140                   [facility] Adult PT Treatment/Exercise - 04/22/16 0001      Exercises   Exercises Other Exercises   Other Exercises  performed HEP per handouts with VC for form                 PT Education - 04/22/16 1511    Education provided Yes   Education Details HEP    Person(s) Educated Patient   Methods Explanation;Demonstration;Handout   Comprehension Returned demonstration;Verbalized understanding             PT Long Term Goals - 04/22/16 1444      PT LONG TERM GOAL #1   Title I with HEP 06/17/16)   Time 8   Period Weeks   Status New     PT LONG TERM GOAL #2   Title increaese Rt hip strength =/> 5-/5 ( 06/17/16)    Time 8   Period Weeks   Status New     PT LONG TERM GOAL #3   Title perform single leg stance activities with good control and no more than 2/10 pain on each side ( 06/17/16)    Time 8   Period Weeks   Status New     PT LONG TERM GOAL #4   Title increased Rt ankle DF =/> 15 degrees ( 06/17/16)    Time 8   Period Weeks   Status New     PT LONG TERM GOAL #5   Title perform Rt SKTC =/> 100 degrees ( 06/17/16)    Time 8   Period Weeks   Status New     Additional Long Term Goals   Additional Long Term Goals Yes     PT LONG TERM GOAL #6   Title be able to tie his Rt shoe without difficulty ( 06/17/16)    Time 8   Period Weeks   Status New               Plan - 04/22/16 1620    Clinical Impression Statement 46 yo  male presents 6  months after Rt THA for moderte complexity PT eval.  He is having issues with his Rt hip/thigh and Lt knee.  He reports pain, stiffness and difficulty performing advanced exercise. The lower leg muscles test strong with break testing however this does  not carry over to functional movements. He has very poor proprioception bilat LE's , weak rt hip, pain, instability in the Lt knee.  He also has decreased scar mobility in the Rt hip and tight hamstrings and Rt quad.    Rehab Potential Good   PT Frequency --  1-2 days a week depending on co-pay    PT Duration 8 weeks   PT Treatment/Interventions Moist Heat;Ultrasound;Therapeutic exercise;Dry needling;Taping;Vasopneumatic Device;Manual techniques;Neuromuscular re-education;Cryotherapy;Electrical Stimulation;Iontophoresis 95m/ml Dexamethasone;Patient/family education;Scar mobilization;Passive range of motion   PT Next Visit Plan progress with single leg activities [place]ato scar    Consulted and Agree with Plan of Care Patient      Patient will benefit from skilled therapeutic intervention in order to improve the following deficits and impairments:  Decreased strength, Impaired flexibility, Decreased scar mobility, Impaired perceived functional ability, Increased muscle spasms, Decreased range of motion  Visit Diagnosis: Pain in right hip - Plan: PT plan of care cert/re-cert  Muscle weakness (generalized) - Plan: PT plan of care cert/re-cert  Other abnormalities of gait and mobility - Plan: PT plan of care cert/re-cert  Other symptoms and signs involving the musculoskeletal system - Plan: PT plan of care cert/re-cert  Stiffness of knee joint, left - Plan: PT plan of care cert/re-cert     Problem List Patient Active Problem List   Diagnosis Date Noted  . Seborrheic dermatitis of scalp 04/03/2016  . Alcohol abuse 03/13/2016  . [place] ([place] 11/[place]  . History of arthroplasty of right hip 12/07/2014  . Excessive daytime sleepiness 11/06/2014  . Primary osteoarthritis of left knee with lateral meniscal tear postarthroscopy 09/21/2014  . Insomnia 09/21/2014  . Erectile dysfunction 09/21/2014  . Hyperlipidemia LDL goal < 100 04/29/2012  . Left lumbar radiculopathy 04/29/2012  .  Hypogonadism male 04/29/2012  . Radiculitis of left cervical region 04/01/2012    SJeral PinchPT  04/22/2016, 4:33 PM  [facility] [address] [address]Phone: [phone]  Fax:  [phone] Name: LQuention McneillMRN: [id number] of Birth: [birth year] PHYSICAL THERAPY DISCHARGE SUMMARY  Visits from Start of Care: 1  Current functional level related to goals / functional outcomes: unknown   Remaining deficits: unknown   Education / Equipment: HEP Plan:                                                    Patient goals were not met. Patient is being discharged due to not returning since the last visit.  ?????     SJeral Pinch PT 06/09/16 12:33 PM

## 2016-04-28 ENCOUNTER — Encounter: Payer: Self-pay | Admitting: Physical Therapy

## 2016-04-30 ENCOUNTER — Encounter (HOSPITAL_COMMUNITY): Payer: Self-pay | Admitting: Medical

## 2016-04-30 ENCOUNTER — Ambulatory Visit (INDEPENDENT_AMBULATORY_CARE_PROVIDER_SITE_OTHER): Payer: 59 | Admitting: Medical

## 2016-04-30 VITALS — BP 116/72 | HR 94 | Resp 16 | Ht 66.0 in | Wt 198.0 lb

## 2016-04-30 DIAGNOSIS — F1021 Alcohol dependence, in remission: Secondary | ICD-10-CM

## 2016-04-30 DIAGNOSIS — Z6372 Alcoholism and drug addiction in family: Secondary | ICD-10-CM

## 2016-04-30 DIAGNOSIS — Z8659 Personal history of other mental and behavioral disorders: Secondary | ICD-10-CM

## 2016-04-30 DIAGNOSIS — G4701 Insomnia due to medical condition: Secondary | ICD-10-CM

## 2016-04-30 DIAGNOSIS — G8929 Other chronic pain: Secondary | ICD-10-CM | POA: Diagnosis not present

## 2016-04-30 DIAGNOSIS — M5440 Lumbago with sciatica, unspecified side: Secondary | ICD-10-CM | POA: Diagnosis not present

## 2016-04-30 DIAGNOSIS — X503XXA Overexertion from repetitive movements, initial encounter: Secondary | ICD-10-CM

## 2016-04-30 DIAGNOSIS — T148XXA Other injury of unspecified body region, initial encounter: Secondary | ICD-10-CM

## 2016-04-30 DIAGNOSIS — Z96641 Presence of right artificial hip joint: Secondary | ICD-10-CM | POA: Diagnosis not present

## 2016-04-30 MED ORDER — TRAZODONE HCL 50 MG PO TABS: ORAL_TABLET | ORAL | 0 refills | Status: DC
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
## 2016-05-01 ENCOUNTER — Encounter (HOSPITAL_COMMUNITY): Payer: Self-pay | Admitting: Medical

## 2016-05-04 ENCOUNTER — Ambulatory Visit (INDEPENDENT_AMBULATORY_CARE_PROVIDER_SITE_OTHER): Payer: 59

## 2016-05-04 ENCOUNTER — Ambulatory Visit (INDEPENDENT_AMBULATORY_CARE_PROVIDER_SITE_OTHER): Payer: 59 | Admitting: Sports Medicine

## 2016-05-04 ENCOUNTER — Encounter: Payer: Self-pay | Admitting: Sports Medicine

## 2016-05-04 DIAGNOSIS — M1712 Unilateral primary osteoarthritis, left knee: Secondary | ICD-10-CM

## 2016-05-04 DIAGNOSIS — F39 Unspecified mood [affective] disorder: Secondary | ICD-10-CM

## 2016-05-04 DIAGNOSIS — M25561 Pain in right knee: Secondary | ICD-10-CM

## 2016-05-04 DIAGNOSIS — R000 Tachycardia, unspecified: Secondary | ICD-10-CM | POA: Insufficient documentation

## 2016-05-04 DIAGNOSIS — Z96641 Presence of right artificial hip joint: Secondary | ICD-10-CM

## 2016-05-04 DIAGNOSIS — G8929 Other chronic pain: Secondary | ICD-10-CM | POA: Diagnosis not present

## 2016-05-04 DIAGNOSIS — Z9889 Other specified postprocedural states: Secondary | ICD-10-CM

## 2016-05-04 DIAGNOSIS — E291 Testicular hypofunction: Secondary | ICD-10-CM | POA: Diagnosis not present

## 2016-05-04 MED ORDER — TESTOSTERONE 40.5 MG/2.5GM (1.62%) TD GEL: 1.0000 "application " | Freq: Every day | TRANSDERMAL | 0 refills | Status: DC

## 2016-05-04 NOTE — Progress Notes (Signed)
  Subjective:    CC: Follow-up  HPI: Anxiety and depression: Improving significantly, he did establish with psychiatry, trazodone was added which is also helping him sleep.  Right knee pain: Present for months, localized just distal to the medial tibial plateau, worse with weightbearing. No mechanical symptoms, has failed greater than 6 weeks of physician directed rehabilitation and limited weightbearing.  Right hip arthroplasty: Needs to follow up with his surgeon, continues to have a bit of pain.  Left knee pain: Post arthroscopy with OCD, meniscal tearing, having recurrence of locking, catching, buckling.  Past medical history:  Negative.  See flowsheet/record as well for more information.  Surgical history: Negative.  See flowsheet/record as well for more information.  Family history: Negative.  See flowsheet/record as well for more information.  Social history: Negative.  See flowsheet/record as well for more information.  Allergies, and medications have been entered into the medical record, reviewed, and no changes needed.   Review of Systems: No fevers, chills, night sweats, weight loss, chest pain, or shortness of breath.   Objective:    General: Well Developed, well nourished, and in no acute distress.  Neuro: Alert and oriented x3, extra-ocular muscles intact, sensation grossly intact.  HEENT: Normocephalic, atraumatic, pupils equal round reactive to light, neck supple, no masses, no lymphadenopathy, thyroid nonpalpable.  Skin: Warm and dry, no rashes. Cardiac: Regular rate and rhythm, no murmurs rubs or gallops, no lower extremity edema.  Respiratory: Clear to auscultation bilaterally. Not using accessory muscles, speaking in full sentences. Right Knee: Normal to inspection with no erythema or effusion or obvious bony abnormalities. Tender palpation just distal to the medial tibial plateau ROM normal in flexion and extension and lower leg rotation. Ligaments with solid  consistent endpoints including ACL, PCL, LCL, MCL. Negative Mcmurray's and provocative meniscal tests. Non painful patellar compression. Patellar and quadriceps tendons unremarkable. Hamstring and quadriceps strength is normal. Left Knee: Normal to inspection with no erythema or effusion or obvious bony abnormalities. Tender to palpation at the lateral joint line ROM normal in flexion and extension and lower leg rotation. Ligaments with solid consistent endpoints including ACL, PCL, LCL, MCL. Negative Mcmurray's and provocative meniscal tests. Non painful patellar compression. Patellar and quadriceps tendons unremarkable. Hamstring and quadriceps strength is normal.  Impression and Recommendations:    Mood disorder (HCC) Doing extremely well on 15 mg of BuSpar, he has now established with psychiatry as well, and trazodone was added at bedtime. Overall doing extremely well and continues to be abstinent from alcohol. I would like psychiatry to spearhead his treatment from now on.  History of arthroplasty of right hip Patient will address further problems with his hip arthroplasty with his orthopedist.  Primary osteoarthritis of left knee with lateral meniscal tear postarthroscopy Recurrence of locking in the left knee. Ordering a new MRI. He has already had x-rays and surgery.  Right knee pain Present for several months, just distal to the medial joint line. Concerning for tibial/subchondral stress fracture. X-rays, MRI, return to go over MRI results.  Tachycardia Mild, but persistent, has been as high as 140 at rest. With history of alcohol abuse we are going to need an echocardiogram to look for dilated cardiomyopathy. Rhythm has typically been sinus tachycardia in the past.  Hypogonadism male Refilling medication  I spent 40 minutes with this patient, greater than 50% was face-to-face time counseling regarding the above diagnoses

## 2016-05-04 NOTE — Assessment & Plan Note (Signed)
Doing extremely well on 15 mg of BuSpar, he has now established with psychiatry as well, and trazodone was added at bedtime. Overall doing extremely well and continues to be abstinent from alcohol. I would like psychiatry to spearhead his treatment from now on.

## 2016-05-04 NOTE — Assessment & Plan Note (Signed)
Present for several months, just distal to the medial joint line. Concerning for tibial/subchondral stress fracture. X-rays, MRI, return to go over MRI results.

## 2016-05-04 NOTE — Assessment & Plan Note (Signed)
Refilling medication

## 2016-05-04 NOTE — Assessment & Plan Note (Signed)
Mild, but persistent, has been as high as 140 at rest. With history of alcohol abuse we are going to need an echocardiogram to look for dilated cardiomyopathy. Rhythm has typically been sinus tachycardia in the past.

## 2016-05-04 NOTE — Assessment & Plan Note (Signed)
Patient will address further problems with his hip arthroplasty with his orthopedist.

## 2016-05-04 NOTE — Assessment & Plan Note (Signed)
Recurrence of locking in the left knee. Ordering a new MRI. He has already had x-rays and surgery.

## 2016-05-06 DIAGNOSIS — M25562 Pain in left knee: Secondary | ICD-10-CM

## 2016-05-06 DIAGNOSIS — M25561 Pain in right knee: Secondary | ICD-10-CM

## 2016-05-06 HISTORY — DX: Pain in right knee: M25.561

## 2016-05-06 HISTORY — DX: Pain in right knee: M25.562

## 2016-05-11 ENCOUNTER — Other Ambulatory Visit: Payer: Self-pay

## 2016-05-13 ENCOUNTER — Other Ambulatory Visit (HOSPITAL_BASED_OUTPATIENT_CLINIC_OR_DEPARTMENT_OTHER): Payer: Self-pay

## 2016-05-14 ENCOUNTER — Encounter (HOSPITAL_COMMUNITY): Payer: Self-pay | Admitting: Medical

## 2016-05-14 ENCOUNTER — Ambulatory Visit (INDEPENDENT_AMBULATORY_CARE_PROVIDER_SITE_OTHER): Payer: 59 | Admitting: Medical

## 2016-05-14 VITALS — BP 124/70 | HR 95 | Resp 16 | Ht 64.0 in | Wt 194.4 lb

## 2016-05-14 DIAGNOSIS — S72001S Fracture of unspecified part of neck of right femur, sequela: Secondary | ICD-10-CM

## 2016-05-14 DIAGNOSIS — X503XXA Overexertion from repetitive movements, initial encounter: Secondary | ICD-10-CM

## 2016-05-14 DIAGNOSIS — T148XXA Other injury of unspecified body region, initial encounter: Secondary | ICD-10-CM

## 2016-05-14 DIAGNOSIS — M25561 Pain in right knee: Secondary | ICD-10-CM | POA: Diagnosis not present

## 2016-05-14 DIAGNOSIS — F1021 Alcohol dependence, in remission: Secondary | ICD-10-CM

## 2016-05-14 DIAGNOSIS — G8921 Chronic pain due to trauma: Secondary | ICD-10-CM

## 2016-05-14 DIAGNOSIS — M25562 Pain in left knee: Secondary | ICD-10-CM

## 2016-05-14 DIAGNOSIS — F10230 Alcohol dependence with withdrawal, uncomplicated: Secondary | ICD-10-CM

## 2016-05-14 DIAGNOSIS — Z6372 Alcoholism and drug addiction in family: Secondary | ICD-10-CM | POA: Diagnosis not present

## 2016-05-14 DIAGNOSIS — Z96641 Presence of right artificial hip joint: Secondary | ICD-10-CM

## 2016-05-14 DIAGNOSIS — F10930 Alcohol use, unspecified with withdrawal, uncomplicated: Secondary | ICD-10-CM

## 2016-05-14 DIAGNOSIS — F419 Anxiety disorder, unspecified: Secondary | ICD-10-CM | POA: Diagnosis not present

## 2016-05-14 HISTORY — DX: Fracture of unspecified part of neck of right femur, sequela: S72.001S

## 2016-05-14 MED ORDER — BACLOFEN 10 MG PO TABS: ORAL_TABLET | ORAL | 1 refills | Status: DC
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
## 2016-05-18 ENCOUNTER — Ambulatory Visit (INDEPENDENT_AMBULATORY_CARE_PROVIDER_SITE_OTHER): Payer: 59

## 2016-05-18 DIAGNOSIS — G8929 Other chronic pain: Secondary | ICD-10-CM

## 2016-05-18 DIAGNOSIS — S83282A Other tear of lateral meniscus, current injury, left knee, initial encounter: Secondary | ICD-10-CM

## 2016-05-18 DIAGNOSIS — X58XXXA Exposure to other specified factors, initial encounter: Secondary | ICD-10-CM

## 2016-05-18 DIAGNOSIS — M1712 Unilateral primary osteoarthritis, left knee: Secondary | ICD-10-CM

## 2016-05-18 DIAGNOSIS — M2241 Chondromalacia patellae, right knee: Secondary | ICD-10-CM | POA: Diagnosis not present

## 2016-05-18 DIAGNOSIS — M25561 Pain in right knee: Principal | ICD-10-CM

## 2016-05-19 ENCOUNTER — Telehealth: Payer: Self-pay | Admitting: Sports Medicine

## 2016-05-19 MED ORDER — SODIUM HYALURONATE (VISCOSUP) 25 MG/2.5ML IX SOSY: PREFILLED_SYRINGE | INTRA_ARTICULAR | 0 refills | Status: DC

## 2016-05-19 NOTE — Telephone Encounter (Signed)
Received the following information from [employer] benefits investigation:   Orthovisc is a not covered drug. After the deductible is met, insurance will cover at 85% of the allowable for the administration code. members coinsurance will be 15%.REF#[id number] Orthovisc is not covered under pharmacy plan due to its a non covered drug. Patient may be eligible for the [employer] Patient Access Program. Please have them call at [phone]  Will need an Rx sent to [facility]. Both Supartz and Euflexxa will require PA.

## 2016-05-19 NOTE — Telephone Encounter (Signed)
Okay I will send Supartz, I will send it for both knees.

## 2016-05-22 ENCOUNTER — Ambulatory Visit (INDEPENDENT_AMBULATORY_CARE_PROVIDER_SITE_OTHER): Payer: 59 | Admitting: Sports Medicine

## 2016-05-22 ENCOUNTER — Encounter: Payer: Self-pay | Admitting: Sports Medicine

## 2016-05-22 DIAGNOSIS — M170 Bilateral primary osteoarthritis of knee: Secondary | ICD-10-CM | POA: Diagnosis not present

## 2016-05-22 DIAGNOSIS — Z96641 Presence of right artificial hip joint: Secondary | ICD-10-CM

## 2016-05-22 DIAGNOSIS — Z9889 Other specified postprocedural states: Secondary | ICD-10-CM

## 2016-05-22 NOTE — Assessment & Plan Note (Signed)
Persistent pain, needs a second opinion from Dr. Turner Danielsowan.

## 2016-05-22 NOTE — Assessment & Plan Note (Signed)
MRIs simply showed osteoarthritis with degenerative meniscal tearing. Awaiting Supartz to be delivered, starting with bilateral steroid injections today.

## 2016-05-22 NOTE — Progress Notes (Signed)
  Subjective:    CC: Follow-up  HPI: Bilateral knee pain: MRI did confirm simple bilateral osteoarthritis with expected degenerative meniscal tearing. We haven't done injections into his knees yet. Pain is moderate, persistent, localized at the joint lines with occasional mechanical symptoms and catching. He was able to play 18 holes of golf recently.  Right hip arthroplasty: Persistent pain after year, desires second opinion.  Past medical history:  Negative.  See flowsheet/record as well for more information.  Surgical history: Negative.  See flowsheet/record as well for more information.  Family history: Negative.  See flowsheet/record as well for more information.  Social history: Negative.  See flowsheet/record as well for more information.  Allergies, and medications have been entered into the medical record, reviewed, and no changes needed.   Review of Systems: No fevers, chills, night sweats, weight loss, chest pain, or shortness of breath.   Objective:    General: Well Developed, well nourished, and in no acute distress.  Neuro: Alert and oriented x3, extra-ocular muscles intact, sensation grossly intact.  HEENT: Normocephalic, atraumatic, pupils equal round reactive to light, neck supple, no masses, no lymphadenopathy, thyroid nonpalpable.  Skin: Warm and dry, no rashes. Cardiac: Regular rate and rhythm, no murmurs rubs or gallops, no lower extremity edema.  Respiratory: Clear to auscultation bilaterally. Not using accessory muscles, speaking in full sentences.  Procedure: Real-time Ultrasound Guided Injection of right knee Device: GE Logiq E  Verbal informed consent obtained.  Time-out conducted.  Noted no overlying erythema, induration, or other signs of local infection.  Skin prepped in a sterile fashion.  Local anesthesia: Topical Ethyl chloride.  With sterile technique and under real time ultrasound guidance:  1 mL kenalog 40, 2 mL lidocaine, 2 mL bupivacaine injected  easily. Completed without difficulty  Pain immediately resolved suggesting accurate placement of the medication.  Advised to call if fevers/chills, erythema, induration, drainage, or persistent bleeding.  Images permanently stored and available for review in the ultrasound unit.  Impression: Technically successful ultrasound guided injection.  Procedure: Real-time Ultrasound Guided Injection of left knee Device: GE Logiq E  Verbal informed consent obtained.  Time-out conducted.  Noted no overlying erythema, induration, or other signs of local infection.  Skin prepped in a sterile fashion.  Local anesthesia: Topical Ethyl chloride.  With sterile technique and under real time ultrasound guidance:  1 mL kenalog 40, 2 mL lidocaine, 2 mL bupivacaine injected easily. Completed without difficulty  Pain immediately resolved suggesting accurate placement of the medication.  Advised to call if fevers/chills, erythema, induration, drainage, or persistent bleeding.  Images permanently stored and available for review in the ultrasound unit.  Impression: Technically successful ultrasound guided injection.  Impression and Recommendations:    Primary osteoarthritis of both knees MRIs simply showed osteoarthritis with degenerative meniscal tearing. Awaiting Supartz to be delivered, starting with bilateral steroid injections today.   History of arthroplasty of right hip Persistent pain, needs a second opinion from Dr. Turner Danielsowan.

## 2016-05-28 ENCOUNTER — Telehealth: Payer: Self-pay | Admitting: *Deleted

## 2016-05-28 DIAGNOSIS — E291 Testicular hypofunction: Secondary | ICD-10-CM

## 2016-05-28 NOTE — Telephone Encounter (Signed)
Patient needs testosterone labs drawn. Last labs 2015. Called patient to let him know. He states he is not currently taking testosterone but has been taking "deer antler" trying to use a more natural approach. He would says it working but he will come in to get his labs checked a little while once "the deer antler" is out of his system. Lab order placed. Will hold onto PA form for now

## 2016-05-29 ENCOUNTER — Ambulatory Visit (INDEPENDENT_AMBULATORY_CARE_PROVIDER_SITE_OTHER): Payer: Managed Care, Other (non HMO) | Admitting: Licensed Clinical Social Worker

## 2016-05-29 DIAGNOSIS — F1021 Alcohol dependence, in remission: Secondary | ICD-10-CM

## 2016-05-29 DIAGNOSIS — X503XXA Overexertion from repetitive movements, initial encounter: Secondary | ICD-10-CM

## 2016-05-29 DIAGNOSIS — T148XXA Other injury of unspecified body region, initial encounter: Secondary | ICD-10-CM

## 2016-05-29 DIAGNOSIS — F411 Generalized anxiety disorder: Secondary | ICD-10-CM

## 2016-05-29 NOTE — Progress Notes (Signed)
   THERAPIST PROGRESS NOTE  Session Time: 9:00 AM -10:00 AM  Participation Level: Active  Behavioral Response: CasualAlertEuthymic  Type of Therapy: Individual Therapy  Treatment Goals addressed:  decrease anxiety work on healthy relationship skills and taking healthy adjustments to life transitions  Interventions: CBT, Solution Focused, Strength-based, Supportive and Other: Healthy relationship skills  Summary: Jerry FenderLarry Shannon is a 46 y.o. male who presents with sharing with therapist that he has 11 weeks clean. He was drinking to deal with anxiety and pain. Finding better ways to cope. He is working with doctor on medications and exercising. He is still having problems with hip. Completed treatment plan update and patient frustrated that it had to be done in session. Goals were changed based on patient's progress. He needs to work on coping strategies for anxiety, and described that relationship issues is dysfunctional and causes stress. He wants to work on making his relationship healthier. He also describes changes in life including change in job situation and physical problems he has to address as well as it causing more limitations in his activity level. He wants to work on adjusting to life transition and feels he is starting to adjust. Described that relationship is better but described dysfunctional patterns with her wife takes her emotions out on patient, patient considers her needs but does not feel she considers his needs and can criticize him while she is doing the same behavior. Reviewed session and patient relates through interacting with therapist he is gaining insight of things he needs to do. He needs to take steps in building a healthier relationship, and continued to move forward and utilizing healthy coping strategy to manage pain and anxiety.     Suicidal/Homicidal: No  Therapist Response: Up dated treatment plan and adjusted goals based on patient's needs and progress. Therapist  discussed finding alternatives activities to drinking that will be healthier coping. Provided positive feedback for patient and already taking positive steps and medication for anxiety, working with doctors on physical issues and exercising. Help patient to process feelings around relationship and stressors. Worked on building insight to healthy relationship skills and work with patient on developing a strategies to implement the skills. Identified a significant issue is that patient needs are important in the relationship and that is important for him to take care of his own needs as well as considering his partner's. Worked on building insight that positive attitude and physicians are part of life and helping with coping. Provided strength based and supportive interventions.   Plan: Return again in 2 weeks.2. Therapist continued to work with patient on healthy relationship skills and coping strategies to manage physical issues and anxiety  Diagnosis: Axis I: Alcohol Dependence, in remission, Trauma Cumulative, Generalized Anxiety Disorder    Axis II: No diagnosis    Jerry Shannon A, LCSW 05/29/2016

## 2016-06-01 ENCOUNTER — Telehealth: Payer: Self-pay

## 2016-06-01 DIAGNOSIS — E782 Mixed hyperlipidemia: Secondary | ICD-10-CM

## 2016-06-01 NOTE — Telephone Encounter (Signed)
I didn't order an EEG.  Adding updated labs.

## 2016-06-01 NOTE — Telephone Encounter (Signed)
Pt left VM stating he may have to hold off on his EEG until billing has been corrected. States he would have to pay $1,000 out of pocket and feels his deductible should have been met by now after MRI's and injections. Pt would also like to have his vitamin D, calcium and lipids checked when he comes in for his testosterone check. Please advise.

## 2016-06-03 ENCOUNTER — Ambulatory Visit (HOSPITAL_BASED_OUTPATIENT_CLINIC_OR_DEPARTMENT_OTHER): Payer: PRIVATE HEALTH INSURANCE

## 2016-06-04 ENCOUNTER — Ambulatory Visit (INDEPENDENT_AMBULATORY_CARE_PROVIDER_SITE_OTHER): Payer: Managed Care, Other (non HMO) | Admitting: Medical

## 2016-06-04 ENCOUNTER — Telehealth: Payer: Self-pay

## 2016-06-04 ENCOUNTER — Encounter (HOSPITAL_COMMUNITY): Payer: Self-pay | Admitting: Medical

## 2016-06-04 VITALS — BP 126/76 | HR 88 | Resp 16 | Ht 64.0 in | Wt 193.0 lb

## 2016-06-04 DIAGNOSIS — F419 Anxiety disorder, unspecified: Secondary | ICD-10-CM

## 2016-06-04 DIAGNOSIS — M25561 Pain in right knee: Secondary | ICD-10-CM

## 2016-06-04 DIAGNOSIS — X503XXA Overexertion from repetitive movements, initial encounter: Secondary | ICD-10-CM

## 2016-06-04 DIAGNOSIS — F1021 Alcohol dependence, in remission: Secondary | ICD-10-CM

## 2016-06-04 DIAGNOSIS — G8921 Chronic pain due to trauma: Secondary | ICD-10-CM

## 2016-06-04 DIAGNOSIS — Z96641 Presence of right artificial hip joint: Secondary | ICD-10-CM

## 2016-06-04 DIAGNOSIS — G4701 Insomnia due to medical condition: Secondary | ICD-10-CM

## 2016-06-04 DIAGNOSIS — M25562 Pain in left knee: Secondary | ICD-10-CM

## 2016-06-04 DIAGNOSIS — Z8659 Personal history of other mental and behavioral disorders: Secondary | ICD-10-CM

## 2016-06-04 DIAGNOSIS — F411 Generalized anxiety disorder: Secondary | ICD-10-CM

## 2016-06-04 DIAGNOSIS — T148XXA Other injury of unspecified body region, initial encounter: Principal | ICD-10-CM

## 2016-06-04 DIAGNOSIS — Z6372 Alcoholism and drug addiction in family: Secondary | ICD-10-CM

## 2016-06-04 MED ORDER — METHOCARBAMOL 500 MG PO TABS: 500.0000 mg | ORAL_TABLET | Freq: Three times a day (TID) | ORAL | 0 refills | Status: DC

## 2016-06-04 NOTE — Telephone Encounter (Signed)
Pt advised.

## 2016-06-04 NOTE — Telephone Encounter (Signed)
All of the muscle relaxers do to some degree.  We can try robaxin, its less sedating.

## 2016-06-04 NOTE — Telephone Encounter (Signed)
He has baclofen on the chart, does he want something different?
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
## 2016-06-04 NOTE — Telephone Encounter (Signed)
PA form faxed from pharmacy. Completed form and faxed back.

## 2016-06-04 NOTE — Telephone Encounter (Signed)
Pt left VM requesting a muscle relaxer. Please advise.

## 2016-06-04 NOTE — Telephone Encounter (Signed)
Pt states he was given baclofen after surgery for sleep but he doesn't want anything that makes sleepy. Please assist.

## 2016-06-05 LAB — COMPREHENSIVE METABOLIC PANEL WITH GFR
Alkaline Phosphatase: 68 U/L (ref 40–115)
BUN: 10 mg/dL (ref 7–25)
Calcium: 9.4 mg/dL (ref 8.6–10.3)
Chloride: 101 mmol/L (ref 98–110)
Glucose, Bld: 82 mg/dL (ref 65–99)
Potassium: 4 mmol/L (ref 3.5–5.3)

## 2016-06-05 LAB — COMPREHENSIVE METABOLIC PANEL
ALT: 17 U/L (ref 9–46)
AST: 16 U/L (ref 10–40)
Albumin: 4.3 g/dL (ref 3.6–5.1)
CO2: 27 mmol/L (ref 20–31)
Creat: 0.9 mg/dL (ref 0.60–1.35)
Sodium: 136 mmol/L (ref 135–146)
Total Bilirubin: 0.7 mg/dL (ref 0.2–1.2)
Total Protein: 7.2 g/dL (ref 6.1–8.1)

## 2016-06-05 LAB — LIPID PANEL W/REFLEX DIRECT LDL
Cholesterol: 297 mg/dL — ABNORMAL HIGH (ref <200)
HDL: 41 mg/dL (ref >40)
LDL-Cholesterol: 220 mg/dL — ABNORMAL HIGH
Non-HDL Cholesterol (Calc): 256 mg/dL — ABNORMAL HIGH (ref <130)
Total CHOL/HDL Ratio: 7.2 Ratio — ABNORMAL HIGH (ref <5.0)
Triglycerides: 185 mg/dL — ABNORMAL HIGH (ref <150)

## 2016-06-05 LAB — TESTOSTERONE: Testosterone: 589 ng/dL (ref 250–827)

## 2016-06-05 LAB — VITAMIN D 25 HYDROXY (VIT D DEFICIENCY, FRACTURES): Vit D, 25-Hydroxy: 46 ng/mL (ref 30–100)

## 2016-06-08 NOTE — Telephone Encounter (Signed)
Notified. 

## 2016-06-10 ENCOUNTER — Other Ambulatory Visit: Payer: Self-pay | Admitting: Orthopedic Surgery

## 2016-06-10 DIAGNOSIS — M25551 Pain in right hip: Secondary | ICD-10-CM

## 2016-06-16 ENCOUNTER — Ambulatory Visit (INDEPENDENT_AMBULATORY_CARE_PROVIDER_SITE_OTHER): Payer: No Typology Code available for payment source | Admitting: Licensed Clinical Social Worker

## 2016-06-16 DIAGNOSIS — F411 Generalized anxiety disorder: Secondary | ICD-10-CM

## 2016-06-16 DIAGNOSIS — X503XXA Overexertion from repetitive movements, initial encounter: Secondary | ICD-10-CM

## 2016-06-16 DIAGNOSIS — F1021 Alcohol dependence, in remission: Secondary | ICD-10-CM

## 2016-06-16 DIAGNOSIS — T148XXA Other injury of unspecified body region, initial encounter: Secondary | ICD-10-CM

## 2016-06-16 NOTE — Progress Notes (Signed)
   THERAPIST PROGRESS NOTE  Session Time: 11:01 AM to 11:57 AM  Participation Level: Active  Behavioral Response: CasualAlertEuthymic  Type of Therapy: Individual Therapy  Treatment Goals addressed: decrease anxiety work on healthy relationship skills and taking healthy adjustments to life transitions  Interventions: Solution Focused, Strength-based, Supportive, Anger Management Training and Other: Stress management, healthy interpersonal skills  Summary: Jerry Shannon is a 46 y.o. male who presents with not drinking since January 3 and feeling a lot better. He still is working on anxiety and anger problems. He is working in [employer] and describes more difficulty in dealing with stress of job. He is related recent situation at work that kept him upset, pacing and couldn't sleep for 2 days. He described difficult difficulty of finding solutions for problems at work. He describes his relationship is better, more assertive with her, it was more difficult to be assertive when drinking. She is not drinking, she is not as mean. Patient describes himself as over passionate not sure how to change that. He describes that it's important for him to excel at his work and to practice ethically. Reviewed session and patient's insight included keep documenting and know doing the right thing, find ways to reduce stress related to dealing with incompetence and unethical practices at work, focusing on caring for self and continuing to fight to do the right thing.  Suicidal/Homicidal: No  Therapist Response: Help patient to process feelings and around work stress. Work with patient on gaining insight as to what are healthy choices for himself and dealing with the stress and helped him with problem solving. Discussed options for patient including making himself a priority and letting things go to take care of self. Identified important values for patient making decisions include  ethical practice and being driven to be the best at work let. Explored upbringing and coming from dysfunctional family. Working with patient on not discounting value and not using external sources to find value such as how much success he has. Provided positive feedback for progress patient is making including using healthier coping skills.    Plan: 1. Patient to schedule appointment once therapist returns from leave. 2. Patient continue to work on stress management and emotional regulation skills. .  Diagnosis: Axis I: Alcohol Dependence, in remission, Trauma Cumulative, Generalized Anxiety Disorder    Axis II: No diagnosis    Bowman,Mary A, LCSW 06/16/2016

## 2016-06-22 ENCOUNTER — Other Ambulatory Visit: Payer: Self-pay

## 2016-06-22 ENCOUNTER — Encounter: Payer: Self-pay | Admitting: Sports Medicine

## 2016-06-22 ENCOUNTER — Ambulatory Visit (INDEPENDENT_AMBULATORY_CARE_PROVIDER_SITE_OTHER): Payer: No Typology Code available for payment source | Admitting: Sports Medicine

## 2016-06-22 DIAGNOSIS — Z96641 Presence of right artificial hip joint: Secondary | ICD-10-CM | POA: Diagnosis not present

## 2016-06-22 DIAGNOSIS — F39 Unspecified mood [affective] disorder: Secondary | ICD-10-CM

## 2016-06-22 DIAGNOSIS — Z9889 Other specified postprocedural states: Secondary | ICD-10-CM

## 2016-06-22 DIAGNOSIS — M170 Bilateral primary osteoarthritis of knee: Secondary | ICD-10-CM

## 2016-06-22 NOTE — Assessment & Plan Note (Signed)
Resolved after bilateral steroid injection. Still awaiting Supartz

## 2016-06-22 NOTE — Assessment & Plan Note (Signed)
Has self discontinued BuSpar, continues to be abstinent from alcohol since January of this year.

## 2016-06-22 NOTE — Assessment & Plan Note (Signed)
Has already seen Dr. Turner Daniels, getting set up for [facility] MRI, question infection.

## 2016-06-22 NOTE — Progress Notes (Signed)
  Subjective:    CC: Follow-up  HPI: Bilateral knee osteoarthritis: Resolved after injection last month.  Alcohol abuse: Has not had a drink since January.  Generalized anxiety: Was closely associated to his alcohol use, essentially was a substance induced mood disorder, he has self discontinued trazodone and BuSpar and is doing well.  Right hip replacement: Got a second opinion from Dr. Turner Daniels, awaiting [facility] MRI.  Past medical history:  Negative.  See flowsheet/record as well for more information.  Surgical history: Negative.  See flowsheet/record as well for more information.  Family history: Negative.  See flowsheet/record as well for more information.  Social history: Negative.  See flowsheet/record as well for more information.  Allergies, and medications have been entered into the medical record, reviewed, and no changes needed.   Review of Systems: No fevers, chills, night sweats, weight loss, chest pain, or shortness of breath.   Objective:    General: Well Developed, well nourished, and in no acute distress.  Neuro: Alert and oriented x3, extra-ocular muscles intact, sensation grossly intact.  HEENT: Normocephalic, atraumatic, pupils equal round reactive to light, neck supple, no masses, no lymphadenopathy, thyroid nonpalpable.  Skin: Warm and dry, no rashes. Cardiac: Regular rate and rhythm, no murmurs rubs or gallops, no lower extremity edema.  Respiratory: Clear to auscultation bilaterally. Not using accessory muscles, speaking in full sentences.  Impression and Recommendations:    Primary osteoarthritis of both knees Resolved after bilateral steroid injection. Still awaiting Supartz  Mood disorder [facility]) Has self discontinued BuSpar, continues to be abstinent from alcohol since January of this year.  History of arthroplasty of right hip Has already seen Dr. Turner Daniels, getting set up for [facility] MRI, question infection.  I spent 25 minutes with this patient, greater than  50% was face-to-face time counseling regarding the above diagnoses

## 2016-06-29 ENCOUNTER — Other Ambulatory Visit: Payer: Self-pay

## 2016-07-01 ENCOUNTER — Ambulatory Visit (HOSPITAL_BASED_OUTPATIENT_CLINIC_OR_DEPARTMENT_OTHER): Payer: Managed Care, Other (non HMO)

## 2016-07-02 ENCOUNTER — Encounter (HOSPITAL_COMMUNITY): Payer: Self-pay | Admitting: Medical

## 2016-07-02 ENCOUNTER — Ambulatory Visit (INDEPENDENT_AMBULATORY_CARE_PROVIDER_SITE_OTHER): Payer: No Typology Code available for payment source | Admitting: Medical

## 2016-07-02 VITALS — BP 132/80 | HR 106 | Resp 18 | Ht 64.0 in | Wt 194.0 lb

## 2016-07-02 DIAGNOSIS — T148XXA Other injury of unspecified body region, initial encounter: Secondary | ICD-10-CM

## 2016-07-02 DIAGNOSIS — G8921 Chronic pain due to trauma: Secondary | ICD-10-CM

## 2016-07-02 DIAGNOSIS — Z8659 Personal history of other mental and behavioral disorders: Secondary | ICD-10-CM

## 2016-07-02 DIAGNOSIS — G44209 Tension-type headache, unspecified, not intractable: Secondary | ICD-10-CM

## 2016-07-02 DIAGNOSIS — F419 Anxiety disorder, unspecified: Secondary | ICD-10-CM

## 2016-07-02 DIAGNOSIS — X503XXA Overexertion from repetitive movements, initial encounter: Secondary | ICD-10-CM

## 2016-07-02 DIAGNOSIS — Z6372 Alcoholism and drug addiction in family: Secondary | ICD-10-CM

## 2016-07-02 DIAGNOSIS — M1610 Unilateral primary osteoarthritis, unspecified hip: Secondary | ICD-10-CM | POA: Insufficient documentation

## 2016-07-02 DIAGNOSIS — F1021 Alcohol dependence, in remission: Secondary | ICD-10-CM

## 2016-07-02 MED ORDER — TOPIRAMATE 25 MG PO TABS: 25.0000 mg | ORAL_TABLET | Freq: Two times a day (BID) | ORAL | 2 refills | Status: DC
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
## 2016-07-06 ENCOUNTER — Ambulatory Visit (INDEPENDENT_AMBULATORY_CARE_PROVIDER_SITE_OTHER): Payer: No Typology Code available for payment source

## 2016-07-06 ENCOUNTER — Telehealth: Payer: Self-pay | Admitting: Sports Medicine

## 2016-07-06 DIAGNOSIS — M65851 Other synovitis and tenosynovitis, right thigh: Secondary | ICD-10-CM

## 2016-07-06 DIAGNOSIS — M25551 Pain in right hip: Secondary | ICD-10-CM

## 2016-07-06 DIAGNOSIS — Z96641 Presence of right artificial hip joint: Secondary | ICD-10-CM | POA: Diagnosis not present

## 2016-07-06 NOTE — Telephone Encounter (Signed)
Pt stated that he has new insurance and would like to try getting OrthoVisc injections approved through his new insurance since his old insurance denied them. Please advise. Thanks!

## 2016-07-06 NOTE — Telephone Encounter (Signed)
Submitted for approval on [employer]. Awaiting confirmation.  

## 2016-07-06 NOTE — Telephone Encounter (Signed)
insurance denied. resubmitted under new insurance.

## 2016-07-30 ENCOUNTER — Other Ambulatory Visit (HOSPITAL_COMMUNITY): Payer: Self-pay | Admitting: Medical

## 2016-07-30 ENCOUNTER — Other Ambulatory Visit: Payer: Self-pay | Admitting: Sports Medicine

## 2016-07-30 ENCOUNTER — Ambulatory Visit (HOSPITAL_COMMUNITY): Payer: Self-pay | Admitting: Medical

## 2016-08-05 ENCOUNTER — Encounter: Payer: Self-pay | Admitting: Sports Medicine

## 2016-08-05 ENCOUNTER — Ambulatory Visit (INDEPENDENT_AMBULATORY_CARE_PROVIDER_SITE_OTHER): Payer: No Typology Code available for payment source | Admitting: Sports Medicine

## 2016-08-05 DIAGNOSIS — Z9889 Other specified postprocedural states: Secondary | ICD-10-CM

## 2016-08-05 DIAGNOSIS — Z96641 Presence of right artificial hip joint: Secondary | ICD-10-CM | POA: Diagnosis not present

## 2016-08-05 NOTE — Progress Notes (Signed)
  Subjective:    CC: Follow-up  HPI: Right hip arthroplasty: Has hurt for many months, ultimately he went to Dr. Turner Danielsowan for a second opinion, and aspiration was performed that yielded frank purulence. He was sent to the emergency department where CT showed a collection that communicated with the hip arthroplasty.  Aspirate yielded overall 100,000 white blood cells and cultures grew out Streptococcus species. He has an appointment with fourth surgery today, he will need either a 1 or a two-stage revision.  Past medical history:  Negative.  See flowsheet/record as well for more information.  Surgical history: Negative.  See flowsheet/record as well for more information.  Family history: Negative.  See flowsheet/record as well for more information.  Social history: Negative.  See flowsheet/record as well for more information.  Allergies, and medications have been entered into the medical record, reviewed, and no changes needed.   Review of Systems: No fevers, chills, night sweats, weight loss, chest pain, or shortness of breath.   Objective:    General: Well Developed, well nourished, and in no acute distress.  Neuro: Alert and oriented x3, extra-ocular muscles intact, sensation grossly intact.  HEENT: Normocephalic, atraumatic, pupils equal round reactive to light, neck supple, no masses, no lymphadenopathy, thyroid nonpalpable.  Skin: Warm and dry, no rashes. Cardiac: Regular rate and rhythm, no murmurs rubs or gallops, no lower extremity edema.  Respiratory: Clear to auscultation bilaterally. Not using accessory muscles, speaking in full sentences.  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 Arthrocentesis yielded possible that grew out Streptococcus species, he had 191,000 white blood cells. He does have [facility], my suspicion is he will need a two-stage  revision with IV antibiotics for a prolonged period of time. Return as needed.  I wished him luck.  I spent 25 minutes with this patient, greater than 50% was face-to-face time counseling regarding the above diagnoses

## 2016-08-05 NOTE — Assessment & Plan Note (Signed)
Unfortunately his right hip arthroplasty has become infected. Arthrocentesis yielded possible that grew out Streptococcus species, he had 191,000 white blood cells. He does have [facility], my suspicion is he will need a two-stage revision with IV antibiotics for a prolonged period of time. Return as needed.  I wished him luck.

## 2016-08-07 ENCOUNTER — Ambulatory Visit (INDEPENDENT_AMBULATORY_CARE_PROVIDER_SITE_OTHER): Payer: No Typology Code available for payment source | Admitting: Licensed Clinical Social Worker

## 2016-08-07 DIAGNOSIS — F411 Generalized anxiety disorder: Secondary | ICD-10-CM

## 2016-08-07 DIAGNOSIS — X503XXA Overexertion from repetitive movements, initial encounter: Secondary | ICD-10-CM

## 2016-08-07 DIAGNOSIS — F1021 Alcohol dependence, in remission: Secondary | ICD-10-CM

## 2016-08-07 DIAGNOSIS — T148XXA Other injury of unspecified body region, initial encounter: Secondary | ICD-10-CM

## 2016-08-07 NOTE — Progress Notes (Signed)
   THERAPIST PROGRESS NOTE  Session Time: 11 AM to 11:55 AM  Participation Level: Active  Behavioral Response: CasualAlertEuthymic  Type of Therapy: Individual Therapy  Treatment Goals addressed:  decrease anxiety, work on healthy relationship skills, and taking healthy adjustments to life transitions  Interventions: CBT, Solution Focused, Strength-based, Supportive and Other: Stress management  Summary: Jerry Shannon is a 46 y.o. male who presents with telling therapist that he is "upbeat and positive". Discussed finding out he had infection from surgery and unhappy because surgeon never followed up with him. He found out from another doctor 9 months later about infection. He discussed different options for treatment and needs to decide which will be the best for him. One of the options would require recovery for 4 months and he is concerned about his bills and livelihood. He relates now that he knows what is wrong he can now deal with it and fight to beat it. Discussed patient's very positive attitude and he related that it's good and bad. He relates that he is dealt with a lot worse. He has rested for a week and has never felt better with the pain. Relates still hasn't had a drink since January and things are good at home. Discussed pain being an issue in the past that led to drinking patient said right now his concern is about which type of treatment to choose. He relates his plan is to listen to healthcare providers in making a decisions. Discussed doing very well job but recognizes nothing is perfect so does not hold himself to a standard perfectionism. His standard is to do the best that he can. Relates energy he put his put into his job include overworking and over training. Patient relates a condition of not being able to do what he used to do physically but still mentally strong. He is happy with things including his relationship although a little unhappy with heath professionals. His plan is  to do the right thing in addressing stressors. He relates that he doesn't try to articulate stressors with his girlfriend as she when she doesn't agree she will fight and she goes negative. He relates having an outlet in therapy is helpful.  Suicidal/Homicidal: No  Therapist Response: Reviewed patient's progress and symptoms and commended patient on his progress and reinforced the importance of client staying focused on her own strengths and resources and resiliency. Provided feedback that patient is utilizing effective coping strategies and is resilient. Discussed how his positive attitude and being a fighter will help him managing his issues. Helped him to process feelings around stressors, processed various strategies for dealing with stressors to help him in problem solving. Identified that therapy is a good release that helps him in managing feelings as he does not have anyone to talk to about stressors. Reviewed rather perfectionism is an issue for anxiety and provided positive feedback to patient and his attitude of doing the best he can for coping.Provided supportive and strength-based interventions.  Plan: 1.patient will call as needed for next therapy appointment. 2. She'll continue to apply effective strategies for stress management and managing his emotions.  Diagnosis: Axis I:  Alcohol Dependence, in remission, Trauma Cumulative, Generalized Anxiety Disorder    Axis II: No diagnosis    Coolidge BreezeMary Bowman, LCSW 08/07/2016

## 2016-08-20 ENCOUNTER — Encounter: Payer: Self-pay | Admitting: Sports Medicine

## 2016-08-20 ENCOUNTER — Ambulatory Visit (INDEPENDENT_AMBULATORY_CARE_PROVIDER_SITE_OTHER): Payer: No Typology Code available for payment source | Admitting: Sports Medicine

## 2016-08-20 DIAGNOSIS — Z96641 Presence of right artificial hip joint: Secondary | ICD-10-CM

## 2016-08-20 DIAGNOSIS — Z9889 Other specified postprocedural states: Secondary | ICD-10-CM

## 2016-08-20 LAB — CBC
HCT: 43.4 % (ref 38.5–50.0)
Hemoglobin: 14.7 g/dL (ref 13.2–17.1)
MCH: 29.9 pg (ref 27.0–33.0)
MCHC: 33.9 g/dL (ref 32.0–36.0)
MCV: 88.2 fL (ref 80.0–100.0)
MPV: 8.5 fL (ref 7.5–12.5)
Platelets: 360 K/uL (ref 140–400)
RBC: 4.92 MIL/uL (ref 4.20–5.80)
RDW: 13.2 % (ref 11.0–15.0)
WBC: 9.9 K/uL (ref 3.8–10.8)

## 2016-08-20 NOTE — Assessment & Plan Note (Signed)
Revision arthroplasty coming up on 08/27/2016. Checking preoperative labs per request, CBC, CMP, PT/INR, ECG, nasal MRSA culture. Patient has greater than 4 metabolic equivalents of exercise tolerance making him a good risk for intermediate risk noncardiac surgery. He is cleared for total hip arthroplasty component explantation and revision.

## 2016-08-20 NOTE — Progress Notes (Signed)
  Subjective:    CC: Surgical clearance  HPI:  This is a pleasant 46 year old male, he has an infected total hip arthroplasty and explantation with revision coming up next week. Has greater than 4 metabolic equivalence of exercise tolerance, needs some preoperative labs which have been ordered.  Past medical history:  Negative.  See flowsheet/record as well for more information.  Surgical history: Negative.  See flowsheet/record as well for more information.  Family history: Negative.  See flowsheet/record as well for more information.  Social history: Negative.  See flowsheet/record as well for more information.  Allergies, and medications have been entered into the medical record, reviewed, and no changes needed.    Review of Systems: No headache, visual changes, nausea, vomiting, diarrhea, constipation, dizziness, abdominal pain, skin rash, fevers, chills, night sweats, swollen lymph nodes, weight loss, chest pain, body aches, joint swelling, muscle aches, shortness of breath, mood changes, visual or auditory hallucinations.  Objective:    General: Well Developed, well nourished, and in no acute distress.  Neuro: Alert and oriented x3, extra-ocular muscles intact, sensation grossly intact.  HEENT: Normocephalic, atraumatic, pupils equal round reactive to light, neck supple, no masses, no lymphadenopathy, thyroid nonpalpable.  Skin: Warm and dry, no rashes noted.  Cardiac: Regular rate and rhythm, no murmurs rubs or gallops.  Respiratory: Clear to auscultation bilaterally. Not using accessory muscles, speaking in full sentences.  Abdominal: Soft, nontender, nondistended, positive bowel sounds, no masses, no organomegaly.  Musculoskeletal: Shoulder, elbow, wrist, hip, knee, ankle stable, and with full range of motion.  Twelve-lead ECG personally reviewed shows a rate of 116 bpm, normal axis, sinus tachycardia, no QT prolongation, no ST changes.  Impression and Recommendations:    The  patient was counselled, risk factors were discussed, anticipatory guidance given.  History of arthroplasty of right hip Revision arthroplasty coming up on 08/27/2016. Checking preoperative labs per request, CBC, CMP, PT/INR, ECG, nasal MRSA culture. Patient has greater than 4 metabolic equivalents of exercise tolerance making him a good risk for intermediate risk noncardiac surgery. He is cleared for total hip arthroplasty component explantation and revision.  I spent 25 minutes with this patient, greater than 50% was face-to-face time counseling regarding the above diagnoses

## 2016-08-20 NOTE — Addendum Note (Signed)
Addended by: Baird KayUGLAS, Sheriann Newmann M on: 08/20/2016 03:31 PM   Modules accepted: Orders

## 2016-08-21 LAB — COMPREHENSIVE METABOLIC PANEL WITH GFR
ALT: 18 U/L (ref 9–46)
BUN: 16 mg/dL (ref 7–25)
CO2: 25 mmol/L (ref 20–31)
Calcium: 9.6 mg/dL (ref 8.6–10.3)
Glucose, Bld: 103 mg/dL — ABNORMAL HIGH (ref 65–99)
Potassium: 4.1 mmol/L (ref 3.5–5.3)

## 2016-08-21 LAB — COMPREHENSIVE METABOLIC PANEL
AST: 15 U/L (ref 10–40)
Albumin: 4.3 g/dL (ref 3.6–5.1)
Alkaline Phosphatase: 95 U/L (ref 40–115)
Chloride: 102 mmol/L (ref 98–110)
Creat: 1 mg/dL (ref 0.60–1.35)
Sodium: 139 mmol/L (ref 135–146)
Total Bilirubin: 0.7 mg/dL (ref 0.2–1.2)
Total Protein: 7.1 g/dL (ref 6.1–8.1)

## 2016-08-21 LAB — PROTIME-INR
INR: 1
Prothrombin Time: 10.4 s (ref 9.0–11.5)

## 2016-08-21 LAB — APTT: aPTT: 33 s (ref 22–34)

## 2016-08-22 LAB — MRSA CULTURE

## 2016-08-25 NOTE — Addendum Note (Signed)
Addended by: Collie SiadICHARDSON, Klaire Court M on: 08/25/2016 11:46 AM   Modules accepted: Orders

## 2016-08-31 LAB — HEPATIC FUNCTION PANEL
ALT: 25 (ref 10–40)
AST: 32 (ref 14–40)
Alkaline Phosphatase: 68 (ref 25–125)
Bilirubin, Total: 0.4

## 2016-08-31 LAB — BASIC METABOLIC PANEL
BUN: 12 (ref 4–21)
Creatinine: 0.9 (ref 0.6–1.3)
GLUCOSE: 75
Potassium: 4.6 (ref 3.4–5.3)
SODIUM: 142 (ref 137–147)

## 2016-08-31 LAB — CBC AND DIFFERENTIAL
HEMATOCRIT: 29 — AB (ref 41–53)
Hemoglobin: 9.2 — AB (ref 13.5–17.5)
Neutrophils Absolute: 6
Platelets: 419 — AB (ref 150–399)
WBC: 10

## 2016-09-02 NOTE — Telephone Encounter (Signed)
Pt got new insurance, resubmitted for bilat OV. New aetna member id: [id number] (network by [employer]).

## 2016-09-04 ENCOUNTER — Other Ambulatory Visit: Payer: Self-pay | Admitting: *Deleted

## 2016-09-04 NOTE — Patient Outreach (Signed)
[facility] Care Management  09/04/2016  Jerry FenderLarry Shannon 03/30/1970 [id number]   Subjective: Telephone call to patient's home  / mobile number, no answer, left HIPAA compliant voicemail message, and requested call back. Telephone call from  patient's home / mobile number, spoke with patient, and HIPAA verified.  Discussed [employer] Transition of care follow up, patient voiced understanding, and states he no longer has [employer] insurance.   States he has [employer] insurance which was effective 06/07/16.     Patient advised he is not eligible for [facility] services and patient voiced understanding. Patient states he had an issue with Jerry Shannon in the past 2 years where he was billed inappropriately for services and has been seeking assistance with setting up an appointment to discuss his concerns face to face.  RNCM advised patient to follow up with the office of patient experience.   Patient voices understanding, states he has followed up, and received a letter from Jerry Shannon.  Patient request RNCM assistance with setting up a meeting to discuss his concerns.  RNCM advised patient RNCM would not be able to arrange a meeting to discuss his concerns, would contact Jerry Shannon office of Patient Experience and ask someone to give him a call.   Patient voiced understanding and is aware that RNCM can not guarantee a meeting with Jerry Shannon or call back from office of patient experience.  Patient voices understanding and states he is appreciative of the [facility] listening to his concerns.  Patient states he does not have any education material, transition of care, care coordination, disease management, disease monitoring, transportation, community resource, or pharmacy needs at this time. [facility] at [facility] Office of Patient Experience [phone]), no answer, left HIPAA compliant voicemail, and requested call back.   Objective: Per Jerry Shannon iCollaborate, KPN (point of  care tool ) and chart review, patient hospitalized 08/27/16 -08/29/16 for Infection and inflammatory reaction due to internal right hip prosthesis.  Status post REVISE TOTAL HIP REPLACEMENT.   Assessment: Received [employer] Transition of care referral on 09/04/16.   Transition of care follow up not completed due to patient not having [employer] insurance, being ineligible for services, and will proceed with case closure.    Plan: RNCM will send case closure request due to patient being ineligible for services, noneligible payer, to [facility] at [facility]. RNCM will follow up with the office of patient experience within 10 business days if no return call.    Jerry Shannon H. Jerry Barefootooper RN, BSN, CCM [facility] Care Management [facility] Telephonic CM Phone: [phone] Fax: [phone]

## 2016-09-11 ENCOUNTER — Telehealth: Payer: Self-pay | Admitting: *Deleted

## 2016-09-11 NOTE — Telephone Encounter (Signed)
Spoke with pt regarding bilateral OrthoVisc injections.  According to the information on the fax received, he has not met his out of pocket cost.  Pt has actually had a hip replacement and revision surgery due to getting an infection after original surgery so it is very likely that he has more than met his out of pocket cost.  After this conversation, pt would still like to wait until he has finished his 6 weeks of abx therapy through his PICC line & is ready to start physically training again.

## 2016-09-14 ENCOUNTER — Telehealth: Payer: Self-pay | Admitting: Sports Medicine

## 2016-09-14 NOTE — Telephone Encounter (Signed)
We can go ahead and start the Orthovisc, his PICC line and hip surgery are independent of what we do to his knee.

## 2016-09-14 NOTE — Telephone Encounter (Signed)
Patient called would like to know when he can schedule his ortho vis injections with you. Patient stated he has an appointment 09/23/16 with his hip surgeon and infectious diease doctor. Patient wants to know if he should wait and ask surgeon about ortho vis injections. Still has pic line in chest but has been cleared to workout just cant lift arms above his head. Patient stated he is doing well and well ahead of his time as far recovery no longer using cane and is excited and has no weight baring restrictions as long as not bothering him. Patient knees hurt worse than hip he advised. Please adv if he should wait to talk to surgeon he doesn't want to make an appointment with you for you to tell him to speak with surgeon so just trying to find out the best option to get Ortho Vis done by you. [phone]

## 2016-09-15 NOTE — Telephone Encounter (Signed)
Patient has been called back and scheduled for 09/24/16 @ 9am. Thanks

## 2016-09-16 ENCOUNTER — Encounter: Payer: Self-pay | Admitting: Sports Medicine

## 2016-09-24 ENCOUNTER — Encounter: Payer: Self-pay | Admitting: Sports Medicine

## 2016-09-24 ENCOUNTER — Ambulatory Visit (INDEPENDENT_AMBULATORY_CARE_PROVIDER_SITE_OTHER): Payer: 59 | Admitting: Sports Medicine

## 2016-09-24 DIAGNOSIS — M170 Bilateral primary osteoarthritis of knee: Secondary | ICD-10-CM | POA: Diagnosis not present

## 2016-09-24 MED ORDER — CYCLOBENZAPRINE HCL 5 MG PO TABS: 5.0000 mg | ORAL_TABLET | Freq: Every day | ORAL | 1 refills | Status: DC

## 2016-09-24 NOTE — Assessment & Plan Note (Signed)
Orthovisc injection #1 of 4 into both knees, return in one week for #2 of 4. 

## 2016-09-24 NOTE — Progress Notes (Signed)

## 2016-10-01 ENCOUNTER — Ambulatory Visit: Payer: Self-pay | Admitting: Sports Medicine

## 2016-10-02 ENCOUNTER — Ambulatory Visit (INDEPENDENT_AMBULATORY_CARE_PROVIDER_SITE_OTHER): Payer: 59 | Admitting: Sports Medicine

## 2016-10-02 VITALS — BP 115/73 | HR 98 | Wt 194.0 lb

## 2016-10-02 DIAGNOSIS — M170 Bilateral primary osteoarthritis of knee: Secondary | ICD-10-CM

## 2016-10-02 NOTE — Assessment & Plan Note (Signed)
Orthovisc No. 2 of 4 into both knees, return in one week for #3. 

## 2016-10-02 NOTE — Progress Notes (Signed)

## 2016-10-05 LAB — CBC AND DIFFERENTIAL
HCT: 44 (ref 41–53)
HEMOGLOBIN: 14.3 (ref 13.5–17.5)
NEUTROS ABS: 3
Platelets: 285 (ref 150–399)
WBC: 5.3

## 2016-10-05 LAB — BASIC METABOLIC PANEL
BUN: 14 (ref 4–21)
CREATININE: 0.9 (ref 0.6–1.3)
Glucose: 106
Potassium: 3.3 — AB (ref 3.4–5.3)
SODIUM: 144 (ref 137–147)

## 2016-10-05 LAB — LIPID PANEL
CHOLESTEROL: 255 — AB (ref 0–200)
HDL: 46 (ref 35–70)
LDL Cholesterol: 179
TRIGLYCERIDES: 151 (ref 40–160)

## 2016-10-08 ENCOUNTER — Encounter: Payer: Self-pay | Admitting: Sports Medicine

## 2016-10-08 ENCOUNTER — Ambulatory Visit (INDEPENDENT_AMBULATORY_CARE_PROVIDER_SITE_OTHER): Payer: 59 | Admitting: Sports Medicine

## 2016-10-08 DIAGNOSIS — M170 Bilateral primary osteoarthritis of knee: Secondary | ICD-10-CM | POA: Diagnosis not present

## 2016-10-08 NOTE — Addendum Note (Signed)
Addended by: Baird KayUGLAS, Siddalee Vanderheiden M on: 10/08/2016 09:24 AM   Modules accepted: Orders

## 2016-10-08 NOTE — Assessment & Plan Note (Signed)
Orthovisc injection #3 of 4 into both knees, return in one week for 4 of 4. 

## 2016-10-08 NOTE — Progress Notes (Signed)

## 2016-10-15 ENCOUNTER — Ambulatory Visit (INDEPENDENT_AMBULATORY_CARE_PROVIDER_SITE_OTHER): Payer: No Typology Code available for payment source | Admitting: Sports Medicine

## 2016-10-15 DIAGNOSIS — M170 Bilateral primary osteoarthritis of knee: Secondary | ICD-10-CM

## 2016-10-15 DIAGNOSIS — K625 Hemorrhage of anus and rectum: Secondary | ICD-10-CM

## 2016-10-15 NOTE — Assessment & Plan Note (Signed)
Orthovisc injection #4 of 4 into both knees, return as needed for this.

## 2016-10-15 NOTE — Progress Notes (Signed)
  Subjective:    CC: Orthovisc injections, rectal bleeding  HPI: Jerry Shannon is here for his fourth and final Orthovisc injections in the both knees, doing well.  Unfortunately over the past few days he's noted some painful bright red blood per rectum, worse with straining on the toilet. Tells me he does not feel any hemorrhoids when wiping. No melena, no hematemesis, no abdominal or epigastric pain.  Past medical history:  Negative.  See flowsheet/record as well for more information.  Surgical history: Negative.  See flowsheet/record as well for more information.  Family history: Negative.  See flowsheet/record as well for more information.  Social history: Negative.  See flowsheet/record as well for more information.  Allergies, and medications have been entered into the medical record, reviewed, and no changes needed.   Review of Systems: No fevers, chills, night sweats, weight loss, chest pain, or shortness of breath.   Objective:    General: Well Developed, well nourished, and in no acute distress.  Neuro: Alert and oriented x3, extra-ocular muscles intact, sensation grossly intact.  HEENT: Normocephalic, atraumatic, pupils equal round reactive to light, neck supple, no masses, no lymphadenopathy, thyroid nonpalpable.  Skin: Warm and dry, no rashes. Cardiac: Regular rate and rhythm, no murmurs rubs or gallops, no lower extremity edema.  Respiratory: Clear to auscultation bilaterally. Not using accessory muscles, speaking in full sentences. Abdomen: Soft, nontender, nondistended, bowel sounds, no palpable masses, guarding, rigidity, rebound pain.  Procedure: Real-time Ultrasound Guided Injection of left knee Device: GE Logiq E  Verbal informed consent obtained.  Time-out conducted.  Noted no overlying erythema, induration, or other signs of local infection.  Skin prepped in a sterile fashion.  Local anesthesia: Topical Ethyl chloride.  With sterile technique and under real time  ultrasound guidance:   30 mg/2 mL of OrthoVisc (sodium hyaluronate) in a prefilled syringe was injected easily into the knee through a 22-gauge needle. Completed without difficulty  Pain immediately resolved suggesting accurate placement of the medication.  Advised to call if fevers/chills, erythema, induration, drainage, or persistent bleeding.  Images permanently stored and available for review in the ultrasound unit.  Impression: Technically successful ultrasound guided injection.  Procedure: Real-time Ultrasound Guided Injection of right knee Device: GE Logiq E  Verbal informed consent obtained.  Time-out conducted.  Noted no overlying erythema, induration, or other signs of local infection.  Skin prepped in a sterile fashion.  Local anesthesia: Topical Ethyl chloride.  With sterile technique and under real time ultrasound guidance:   30 mg/2 mL of OrthoVisc (sodium hyaluronate) in a prefilled syringe was injected easily into the knee through a 22-gauge needle. Completed without difficulty  Pain immediately resolved suggesting accurate placement of the medication.  Advised to call if fevers/chills, erythema, induration, drainage, or persistent bleeding.  Images permanently stored and available for review in the ultrasound unit.  Impression: Technically successful ultrasound guided injection.  Impression and Recommendations:    Primary osteoarthritis of both knees Orthovisc injection #4 of 4 into both knees, return as needed for this.  Rectal bleeding With pain, suspect rectal fissure versus hemorrhoids, he will do fiber and stool softeners for a week and if still painful, back to see me for an anoscopy.

## 2016-10-15 NOTE — Assessment & Plan Note (Addendum)
With pain, suspect rectal fissure versus hemorrhoids, he will do fiber and stool softeners for a week and if still painful, back to see me for an anoscopy.

## 2016-10-19 ENCOUNTER — Encounter: Payer: Self-pay | Admitting: Sports Medicine

## 2016-10-27 ENCOUNTER — Ambulatory Visit (INDEPENDENT_AMBULATORY_CARE_PROVIDER_SITE_OTHER): Payer: No Typology Code available for payment source

## 2016-10-27 ENCOUNTER — Encounter: Payer: Self-pay | Admitting: Sports Medicine

## 2016-10-27 ENCOUNTER — Ambulatory Visit (INDEPENDENT_AMBULATORY_CARE_PROVIDER_SITE_OTHER): Payer: No Typology Code available for payment source | Admitting: Sports Medicine

## 2016-10-27 DIAGNOSIS — M7542 Impingement syndrome of left shoulder: Secondary | ICD-10-CM

## 2016-10-27 DIAGNOSIS — M25511 Pain in right shoulder: Secondary | ICD-10-CM

## 2016-10-27 DIAGNOSIS — M7541 Impingement syndrome of right shoulder: Secondary | ICD-10-CM | POA: Insufficient documentation

## 2016-10-27 DIAGNOSIS — M67911 Unspecified disorder of synovium and tendon, right shoulder: Secondary | ICD-10-CM | POA: Diagnosis not present

## 2016-10-27 DIAGNOSIS — K625 Hemorrhage of anus and rectum: Secondary | ICD-10-CM | POA: Diagnosis not present

## 2016-10-27 DIAGNOSIS — G8929 Other chronic pain: Secondary | ICD-10-CM | POA: Diagnosis not present

## 2016-10-27 NOTE — Progress Notes (Signed)
  Subjective:    CC: follow-up  HPI: Rectal pain: Resolved with stool softeners, no further hematochezia.  Right shoulder pain: History of rotator cuff tearing in the past, has pain over his deltoid, worse with overhed activities and reaching across his chest, as well as bench press. Pain is moderate, persistent, localized without radiation.  Past medical history:  Negative.  See flowsheet/record as well for more information.  Surgical history: Negative.  See flowsheet/record as well for more information.  Family history: Negative.  See flowsheet/record as well for more information.  Social history: Negative.  See flowsheet/record as well for more information.  Allergies, and medications have been entered into the medical record, reviewed, and no changes needed.   Review of Systems: No fevers, chills, night sweats, weight loss, chest pain, or shortness of breath.   Objective:    General: Well Developed, well nourished, and in no acute distress.  Neuro: Alert and oriented x3, extra-ocular muscles intact, sensation grossly intact.  HEENT: Normocephalic, atraumatic, pupils equal round reactive to light, neck supple, no masses, no lymphadenopathy, thyroid nonpalpable.  Skin: Warm and dry, no rashes. Cardiac: Regular rate and rhythm, no murmurs rubs or gallops, no lower extremity edema.  Respiratory: Clear to auscultation bilaterally. Not using accessory muscles, speaking in full sentences. Right Shoulder: Inspection reveals no abnormalities, atrophy or asymmetry. Palpation is normal with no tenderness over AC joint or bicipital groove. ROM is full in all planes. Rotator cuff strength normal throughout, reproduction of pain with resisted internal rotation. positive Neer and Hawkin's tests, empty can. Speeds and Yergason's tests normal. No labral pathology noted with negative Obrien's, negative crank, negative clunk, and good stability. Normal scapular function observed. No painful arc  and no drop arm sign. No apprehension sign  Impression and Recommendations:    Disorder of rotator cuff, right Subscapularis dysfunction with subcoracoid impingement. X-ray, MRI as this is been present for greater than 6 weeks. He will continue home rehabilitation exercises, return for MRI results.  Rectal bleeding Rectal bleeding, pain has resolved.

## 2016-10-27 NOTE — Assessment & Plan Note (Signed)
Subscapularis dysfunction with subcoracoid impingement. X-ray, MRI as this is been present for greater than 6 weeks. He will continue home rehabilitation exercises, return for MRI results.

## 2016-10-27 NOTE — Assessment & Plan Note (Signed)
Rectal bleeding, pain has resolved.

## 2016-11-02 ENCOUNTER — Telehealth: Payer: Self-pay | Admitting: Sports Medicine

## 2016-11-02 NOTE — Telephone Encounter (Signed)
Received updated card, authorization in process.

## 2016-11-02 NOTE — Telephone Encounter (Signed)
Left VM for Pt to find out current insurance information. [employer] on file, spoke with [place]. Insurance was only valid from 02/07/16-06/06/16.   Need updated insurance information.

## 2016-11-10 ENCOUNTER — Ambulatory Visit (INDEPENDENT_AMBULATORY_CARE_PROVIDER_SITE_OTHER): Payer: No Typology Code available for payment source

## 2016-11-10 DIAGNOSIS — M67911 Unspecified disorder of synovium and tendon, right shoulder: Secondary | ICD-10-CM

## 2016-11-10 DIAGNOSIS — M25511 Pain in right shoulder: Secondary | ICD-10-CM

## 2016-11-10 DIAGNOSIS — M7581 Other shoulder lesions, right shoulder: Secondary | ICD-10-CM | POA: Diagnosis not present

## 2016-11-10 DIAGNOSIS — G8929 Other chronic pain: Secondary | ICD-10-CM

## 2016-11-11 ENCOUNTER — Ambulatory Visit (INDEPENDENT_AMBULATORY_CARE_PROVIDER_SITE_OTHER): Payer: No Typology Code available for payment source | Admitting: Physical Therapy

## 2016-11-11 ENCOUNTER — Encounter: Payer: Self-pay | Admitting: Physical Therapy

## 2016-11-11 DIAGNOSIS — R2689 Other abnormalities of gait and mobility: Secondary | ICD-10-CM

## 2016-11-11 DIAGNOSIS — M6281 Muscle weakness (generalized): Secondary | ICD-10-CM | POA: Diagnosis not present

## 2016-11-11 DIAGNOSIS — M25651 Stiffness of right hip, not elsewhere classified: Secondary | ICD-10-CM | POA: Diagnosis not present

## 2016-11-11 DIAGNOSIS — M25551 Pain in right hip: Secondary | ICD-10-CM | POA: Diagnosis not present

## 2016-11-11 NOTE — Therapy (Signed)
[facility] [place] [address] [place], [place], [address] Phone: [phone]   Fax:  [phone]  Physical Therapy Evaluation  Patient Details  Name: Jerry Shannon MRN: [id number] Date of Birth: [birth year] Referring Provider: Dr Dewitt Rota  Encounter Date: 11/11/2016      PT End of Session - 11/11/16 1014    Visit Number 1   Number of Visits 12   Date for PT Re-Evaluation 12/23/16   PT Start Time 1016   PT Stop Time 1100   PT Time Calculation (min) 44 min   Activity Tolerance Patient tolerated treatment well      Past Medical History:  Diagnosis Date  . Acute bilateral knee pain 05/06/2016  . Back injury   . GERD (gastroesophageal reflux disease)   . Hip fracture, right, sequela 05/14/2016  . History of hiatal hernia     Past Surgical History:  Procedure Laterality Date  . CHONDROPLASTY Left 11/07/2014   Procedure: CHONDROPLASTY;  Surgeon: Jodi Geralds, MD;  Location: [facility];  Service: Orthopedics;  Laterality: Left;  . KNEE ARTHROSCOPY WITH LATERAL MENISECTOMY Left 11/07/2014   Procedure: Knee arthroscopy with lateral menisectomy, with lateral plica;  Surgeon: Jodi Geralds, MD;  Location:  SURGERY CENTER;  Service: Orthopedics;  Laterality: Left;  . KNEE SURGERY      There were no vitals filed for this visit.       Subjective Assessment - 11/11/16 1017    Subjective Jerry Shannon had an elective Rt THA revision 08/27/16.  Had HHPT and has since been going to the gym. Has been doing the airstrider, cycling, rowing machine.    Pertinent History knee scopes, Rt shoulder bone marrow edema and fraying of the RTC   How long can you sit comfortably? driving a car limited 20'   How long can you walk comfortably? was he is warmed up he is fine, however fatigues after a long walk.    Patient Stated Goals wishes to tie his shoes, improve his flexibility and have a judge on his HEP progression.    Currently in Pain? No/denies  only has pain after intense exercise and driving 20' or longer            [facility] PT Assessment - 11/11/16 0001      Assessment   Medical Diagnosis Rt THA revision   Referring Provider Dr Dewitt Rota   Onset Date/Surgical Date 08/27/16   Hand Dominance Right   Next MD Visit 02/20/17   Prior Therapy yes after first THA     Precautions   Precautions Posterior Hip   Precaution Comments per MD order, patient reports he was told he could bend forward and touch his toes.      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   Leisure work out, watch TV and cook     Observation/Other Assessments   Focus on Therapeutic Outcomes (FOTO)  51% limited     Functional Tests   Functional tests Squat;Lunges;Step down;Single leg stance     Squat   Comments limited motion, focused on keeping good alignmentt     Lunges   Comments Rt knee adducted during lunge  Step Down   Comments WNL     Single Leg Stance   Comments Rt 17 sec, Lt 13 sec     ROM / Strength   AROM / PROM / Strength AROM;Strength;PROM     AROM   AROM Assessment Site --  LE's WNL     PROM   PROM Assessment Site Hip   Right/Left Hip Left;Right   Right Hip External Rotation  17   Right Hip Internal Rotation  29   Left Hip External Rotation  65   Left Hip Internal Rotation  18     Strength   Strength Assessment Site Hip;Knee;Ankle   Right/Left Hip Right  Lt WNL except ext 4+/5   Right Hip Extension 4/5   Right Hip ABduction 4/5  with pain   Right/Left Knee --  bilat WNL   Right/Left Ankle --  bilat WNL     Flexibility   Soft Tissue Assessment /Muscle Length yes   Hamstrings supine SLR Rt 70, Lt 58   Quadriceps prone knee flex Rt  110, Lt 123     Palpation   Palpation comment slight decreased scar mobility      Ambulation/Gait   Ambulation/Gait Yes   Ambulation Distance (Feet) --  observed in gym   Assistive device None   Gait Pattern Decreased stance time - right;Decreased weight shift to right;Antalgic            Objective measurements completed on examination: See above findings.          [facility] Adult PT Treatment/Exercise - 11/11/16 0001      Exercises   Exercises Knee/Hip     Knee/Hip Exercises: Stretches   Active Hamstring Stretch Both;60 seconds  VC for keeping back straight   Quad Stretch Both;60 seconds  prone with strap   Piriformis Stretch Right  to work on ER     Knee/Hip Exercises: Standing   SLS wiht FWD leans     Knee/Hip Exercises: Supine   Single Leg Bridge Strengthening;Both;10 reps;2 sets     Knee/Hip Exercises: Sidelying   Other Sidelying Knee/Hip Exercises 10 reps FWD/BWD kicks, CS/CCW circles.                 PT Education - 11/11/16 1427    Education provided Yes   Education Details HEP   Person(s) Educated Patient   Methods Explanation;Demonstration;Handout   Comprehension Returned demonstration;Verbalized understanding             PT Long Term Goals - 11/11/16 1432      PT LONG TERM GOAL #1   Title I with HEP 12/23/16)   Time 6   Period Weeks   Status New     PT LONG TERM GOAL #2   Title increaese Rt hip strength =/> 5-/5 ( 12/23/16)    Time 6   Period Weeks   Status New     PT LONG TERM GOAL #3   Title improve Rt hip ER =/> 50 degrees to allow him to tie his shoes easily. ( 12/23/16)    Time 6   Period Weeks   Status New     PT LONG TERM GOAL #4   Title ambulate on even and uneven surfaces without gait deviations ( 12/23/16)    Time 6   Period Weeks   Status New     PT LONG TERM GOAL #5   Title perform lunges/squats/deadlifts with safe form to allow him to transition to the gym (  12/23/16)    Time 6   Period Weeks    Status New     Additional Long Term Goals   Additional Long Term Goals Yes     PT LONG TERM GOAL #6   Title improve FOTO =/< 37% limited ( 12/23/16)    Time 6   Period Weeks   Status New                Plan - 11/11/16 1427    Clinical Impression Statement 46 yo male presents 9 wks s/p Rt THrevision.  He had HHPT and is now going to the gym and working out.  He has decreased Rt hip rotation that limit his ability to perform all IADLs, decreased strength that is leading to gait abnormalities and pain with increased mobility. Pt is highly motivated and wanting to return to his prior level of function and will need to be slowed down to allow for tissue healing.    Clinical Presentation Evolving   Clinical Decision Making Low   Rehab Potential Excellent   PT Frequency 2x / week   PT Duration 6 weeks   PT Treatment/Interventions Moist Heat;Ultrasound;Therapeutic exercise;Dry needling;Taping;Manual techniques;Neuromuscular re-education;Cryotherapy;Electrical Stimulation;Iontophoresis 4mg /ml Dexamethasone;Patient/family education;Passive range of motion   PT Next Visit Plan bilat hip ROM for rotation, hip and core strengthening, proprioception, modalities PRN   Consulted and Agree with Plan of Care Patient      Patient will benefit from skilled therapeutic intervention in order to improve the following deficits and impairments:  Decreased strength, Pain, Impaired flexibility, Decreased range of motion, Difficulty walking  Visit Diagnosis: Pain in right hip - Plan: PT plan of care cert/re-cert  Muscle weakness (generalized) - Plan: PT plan of care cert/re-cert  Other abnormalities of gait and mobility - Plan: PT plan of care cert/re-cert  Stiffness of right hip, not elsewhere classified - Plan: PT plan of care cert/re-cert     Problem List Patient Active Problem List   Diagnosis Date Noted  . Disorder of rotator cuff, right 10/27/2016  . Rectal bleeding 10/15/2016  .  Arthropathy of hip 07/02/2016  . Seborrheic dermatitis of scalp 04/03/2016  . Alcohol abuse 03/13/2016  . Mood disorder (HCC) 11/06/2015  . History of arthroplasty of right hip 12/07/2014  . Excessive daytime sleepiness 11/06/2014  . Primary osteoarthritis of both knees 09/21/2014  . Insomnia 09/21/2014  . Erectile dysfunction 09/21/2014  . Hyperlipidemia LDL goal < 100 04/29/2012  . Left lumbar radiculopathy 04/29/2012  . Hypogonadism male 04/29/2012  . Radiculitis of left cervical region 04/01/2012    Jerry Shannon PT  11/11/2016, 2:44 PM  [facility] [address] [place], [place], [address] Phone: [phone]   Fax:  [phone]  Name: Jerry Shannon MRN: [id number] Date of Birth: 02-Jun-1970

## 2016-11-11 NOTE — Patient Instructions (Addendum)
Balance / Reach    Stand on left foot, Holding __0__ pound weight in other hand. Bend knee, lowering body, and reach across. Hold __1__ seconds. Relax. Repeat __10__ times per set. Do __2-3__ sets per session. Do _4___ sessions per week.    Side Kick    Lie on side, back straight along edge of mat, legs 30 in front of torso. Lift top leg to hip height, foot flexed. Exhale, kicking forward twice. Inhale, kicking once backward with pointed foot. Keep leg hip height, torso still. Repeat _10-20___ times. Repeat on other side. Do __1__ sessions per day.  Side Leg Circle    Lie on side, back straight along edge of mat, legs 30 in front of torso. Lift top leg to hip height. Rotate in small circle, _10-20___ times in each direction. Repeat __1-2__ times. Repeat on other side. Do __1__ sessions per day.  Piriformis Stretch, Supine    Lie supine, legs bent, feet flat. Raise one bent leg and, grasping ankle with both hands,  START WITH JUST RESTING Right ankle of the Left knee, progress to pull leg toward opposite shoulder. Use Right hand and press thigh at knee, mid thigh and into groin, Hold each_5__ seconds.  Repeat _5-10__ times per session. Do _1__ sessions per day. Perform with other leg straight.    Quads / HF, Prone    Lie face down, knees together. Grasp one ankle with same-side hand. Use towel if needed to reach. Gently pull foot toward buttock. Hold _60__ seconds. Repeat __1-2_ times per session. Do _1_ sessions per day.   Standing: Advanced    Stand, feet hip distance apart. Bend at hips keeping back and knees straight. Exhale and pull torso toward legs. Hold __60_ seconds. Repeat _1-2__ times per session. Do __1_ sessions per day. Use counter to help support upper body as needed. Do not round your back.

## 2016-11-13 ENCOUNTER — Telehealth: Payer: Self-pay

## 2016-11-13 ENCOUNTER — Encounter: Payer: Self-pay | Admitting: Sports Medicine

## 2016-11-13 ENCOUNTER — Ambulatory Visit (INDEPENDENT_AMBULATORY_CARE_PROVIDER_SITE_OTHER): Payer: No Typology Code available for payment source | Admitting: Rehabilitative and Restorative Service Providers"

## 2016-11-13 ENCOUNTER — Ambulatory Visit (INDEPENDENT_AMBULATORY_CARE_PROVIDER_SITE_OTHER): Payer: No Typology Code available for payment source | Admitting: Sports Medicine

## 2016-11-13 ENCOUNTER — Encounter: Payer: Self-pay | Admitting: Rehabilitative and Restorative Service Providers"

## 2016-11-13 DIAGNOSIS — M67911 Unspecified disorder of synovium and tendon, right shoulder: Secondary | ICD-10-CM

## 2016-11-13 DIAGNOSIS — M6281 Muscle weakness (generalized): Secondary | ICD-10-CM

## 2016-11-13 DIAGNOSIS — M25551 Pain in right hip: Secondary | ICD-10-CM

## 2016-11-13 DIAGNOSIS — R2689 Other abnormalities of gait and mobility: Secondary | ICD-10-CM

## 2016-11-13 NOTE — Patient Instructions (Addendum)
Strengthening: Hip Abduction (Side-Lying)    Tighten muscles on front of left thigh, then lift leg _10___ inches from surface, keeping knee locked.  Repeat _10___ times per set. Do _2-3___ sets per session. Do __1__ sessions per day. Hips rolled forward; lead up with heel   Squat  Keeping weight even  Hold for 5 sec  8-10 reps  1-2 sets

## 2016-11-13 NOTE — Telephone Encounter (Signed)
Pt left VM asking if he can get an injection for his shoulder or what could be done at this time for the discomfort. Please advise.

## 2016-11-13 NOTE — Assessment & Plan Note (Signed)
Initially suspected more subscapularis dysfunction with subcoracoid impingement the last visit. 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 MRI showed some nonspecific fraying of the infraspinatus and supraspinatus as well as acromioclavicular arthrosis. Ultrasound guided injection as above, avoid upper body weightlifting for the next 5 days, to the rehabilitation exercises and return to see me in 6 weeks.

## 2016-11-13 NOTE — Progress Notes (Signed)
  Subjective:    CC: Right shoulder pain  HPI: This is a pleasant 46 year old male, he comes in with right shoulder pain, we have been discussing this for sometime now, and MRI was obtained the results of which will be dictated below. He describes mostly pain over the deltoid and worse with overhead activities, no mechanical symptoms, no trauma.  Past medical history:  Negative.  See flowsheet/record as well for more information.  Surgical history: Negative.  See flowsheet/record as well for more information.  Family history: Negative.  See flowsheet/record as well for more information.  Social history: Negative.  See flowsheet/record as well for more information.  Allergies, and medications have been entered into the medical record, reviewed, and no changes needed.   Review of Systems: No fevers, chills, night sweats, weight loss, chest pain, or shortness of breath.   Objective:    General: Well Developed, well nourished, and in no acute distress.  Neuro: Alert and oriented x3, extra-ocular muscles intact, sensation grossly intact.  HEENT: Normocephalic, atraumatic, pupils equal round reactive to light, neck supple, no masses, no lymphadenopathy, thyroid nonpalpable.  Skin: Warm and dry, no rashes. Cardiac: Regular rate and rhythm, no murmurs rubs or gallops, no lower extremity edema.  Respiratory: Clear to auscultation bilaterally. Not using accessory muscles, speaking in full sentences. Right Shoulder: Inspection reveals no abnormalities, atrophy or asymmetry. Palpation is normal with no tenderness over AC joint or bicipital groove. ROM is full in all planes. Rotator cuff strength normal throughout. Positive Neer and Hawkin's tests, empty can. He did have more subscapularis type symptoms last time which are no longer present. Speeds and Yergason's tests normal. No labral pathology noted with negative Obrien's, negative crank, negative clunk, and good stability. Normal scapular  function observed. No painful arc and no drop arm sign. No apprehension sign  Procedure: Real-time Ultrasound Guided Injection of Right subacromial bursa Device: GE Logiq E  Verbal informed consent obtained.  Time-out conducted.  Noted no overlying erythema, induration, or other signs of local infection.  Skin prepped in a sterile fashion.  Local anesthesia: Topical Ethyl chloride.  With sterile technique and under real time ultrasound guidance:  25-gauge needle advanced into the subacromial bursa, I then injected 1 mL Kenalog 40, 1 mL lidocaine, 1 mL bupivacaine. Completed without difficulty  Pain immediately resolved suggesting accurate placement of the medication.  Advised to call if fevers/chills, erythema, induration, drainage, or persistent bleeding.  Images permanently stored and available for review in the ultrasound unit.  Impression: Technically successful ultrasound guided injection.  MRI showed some nonspecific fraying of the infraspinatus and supraspinatus as well as acromioclavicular arthrosis.  Impression and Recommendations:    Disorder of rotator cuff, right Initially suspected more subscapularis dysfunction with subcoracoid impingement the last visit. 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 MRI showed some nonspecific fraying of the infraspinatus and supraspinatus as well as acromioclavicular arthrosis. Ultrasound guided injection as above, avoid upper body weightlifting for the next 5 days, to the rehabilitation exercises and return to see me in 6 weeks.  ___________________________________________ Ihor Austinhomas J. Benjamin Stainhekkekandam, M.D., ABFM., CAQSM. Primary Care and Sports Medicine [facility]  Adjunct Instructor of Family Medicine  [employer]

## 2016-11-13 NOTE — Telephone Encounter (Signed)
Yes he can

## 2016-11-13 NOTE — Therapy (Addendum)
[facility] [address] [place], [place], [address] Phone: [phone]   Fax:  [phone]  Physical Therapy Treatment  Patient Details  Name: Jerry FenderLarry Vences MRN: [id number] Date of Birth: [birth year] Referring Provider: Dr Dewitt RotaKeith Fehring  Encounter Date: 11/13/2016      PT End of Session - 11/13/16 0844    Visit Number 2   Number of Visits 12   Date for PT Re-Evaluation 12/23/16   PT Start Time 0845   PT Stop Time 0932   PT Time Calculation (min) 47 min   Activity Tolerance Patient tolerated treatment well      Past Medical History:  Diagnosis Date  . Acute bilateral knee pain 05/06/2016  . Back injury   . GERD (gastroesophageal reflux disease)   . Hip fracture, right, sequela 05/14/2016  . History of hiatal hernia     Past Surgical History:  Procedure Laterality Date  . CHONDROPLASTY Left 11/07/2014   Procedure: CHONDROPLASTY;  Surgeon: Jodi GeraldsJohn Graves, MD;  Location: [facility];  Service: Orthopedics;  Laterality: Left;  . KNEE ARTHROSCOPY WITH LATERAL MENISECTOMY Left 11/07/2014   Procedure: Knee arthroscopy with lateral menisectomy, with lateral plica;  Surgeon: Jodi GeraldsJohn Graves, MD;  Location: [facility];  Service: Orthopedics;  Laterality: Left;  . KNEE SURGERY      There were no vitals filed for this visit.      Subjective Assessment - 11/13/16 0844    Subjective Worked yesterday morning at the gym and limped for about three hours. Feels it is just related to weakness in the hip muscules.   Currently in Pain? No/denies                         [facility] Treatment/Exercise - 11/13/16 0001      Knee/Hip Exercises: Stretches   Passive Hamstring Stretch 60 seconds;2 reps  supine with strap    Quad Stretch Both;60 seconds  prone with strap   Hip Flexor Stretch Right;3 reps;30 seconds  sitting drop one knee off edge of chair    Piriformis Stretch Right;3 reps;30  seconds  to work on ER   Other Knee/Hip Stretches PT assist for IR/ER gentle assisted stretch 3 reps each x 30 sec      Knee/Hip Exercises: Aerobic   Stationary Bike L2 x 5 min      Knee/Hip Exercises: Standing   Hip ADduction Strengthening;Right;Left;20 reps   Hip Extension Stengthening;Right;Left;20 reps   Functional Squat 10 reps;5 seconds   SLS with FWD leans     Knee/Hip Exercises: Supine   Single Leg Bridge Strengthening;Both;10 reps;2 sets     Knee/Hip Exercises: Sidelying   Hip ABduction Strengthening;Right;10 reps;2 sets  leading with heel hip in slight extension    Other Sidelying Knee/Hip Exercises 8 reps x 2 sets FWD/BWD kicks, CS/CCW circles.                 PT Education - 11/13/16 0920    Education provided Yes   Education Details HEP    Person(s) Educated Patient   Methods Explanation;Demonstration;Tactile cues;Verbal cues;Handout   Comprehension Verbalized understanding;Returned demonstration;Verbal cues required;Tactile cues required             PT Long Term Goals - 11/11/16 1432      PT LONG TERM GOAL #1   Title I with HEP 12/23/16)   Time 6   Period Weeks   Status New  PT LONG TERM GOAL #2   Title increaese Rt hip strength =/> 5-/5 ( 12/23/16)    Time 6   Period Weeks   Status New     PT LONG TERM GOAL #3   Title improve Rt hip ER =/> 50 degrees to allow him to tie his shoes easily. ( 12/23/16)    Time 6   Period Weeks   Status New     PT LONG TERM GOAL #4   Title ambulate on even and uneven surfaces without gait deviations ( 12/23/16)    Time 6   Period Weeks   Status New     PT LONG TERM GOAL #5   Title perform lunges/squats/deadlifts with safe form to allow him to transition to the gym ( 12/23/16)    Time 6   Period Weeks   Status New     Additional Long Term Goals   Additional Long Term Goals Yes     PT LONG TERM GOAL #6   Title improve FOTO =/< 37% limited ( 12/23/16)    Time 6   Period Weeks   Status New                Plan - 11/13/16 0847    Clinical Impression Statement Cathy reports some increased pain from new exercises but can work through it. Tolerated LE exercises without difficulty. Discussed walking in the pool - backwards/forwards/sidesteps no more than 10-15 min. Progressing toward goals.    Rehab Potential Excellent   PT Frequency 2x / week   PT Duration 6 weeks   PT Treatment/Interventions Moist Heat;Ultrasound;Therapeutic exercise;Dry needling;Taping;Manual techniques;Neuromuscular re-education;Cryotherapy;Electrical Stimulation;Iontophoresis /ml Dexamethasone;Patient/family education;Passive range of motion   PT Next Visit Plan bilat hip ROM for rotation, hip and core strengthening, proprioception, modalities PRN   Consulted and Agree with Plan of Care Patient      Patient will benefit from skilled therapeutic intervention in order to improve the following deficits and impairments:  Decreased strength, Pain, Impaired flexibility, Decreased range of motion, Difficulty walking  Visit Diagnosis: Pain in right hip  Muscle weakness (generalized)  Other abnormalities of gait and mobility     Problem List Patient Active Problem List   Diagnosis Date Noted  . Disorder of rotator cuff, right 10/27/2016  . Rectal bleeding 10/15/2016  . Arthropathy of hip 07/02/2016  . Seborrheic dermatitis of scalp 04/03/2016  . Alcohol abuse 03/13/2016  . Mood disorder (HCC) 11/06/2015  . History of arthroplasty of right hip 12/07/2014  . Excessive daytime sleepiness 11/06/2014  . Primary osteoarthritis of both knees 09/21/2014  . Insomnia 09/21/2014  . Erectile dysfunction 09/21/2014  . Hyperlipidemia LDL goal < 100 04/29/2012  . Left lumbar radiculopathy 04/29/2012  . Hypogonadism male 04/29/2012  . Radiculitis of left cervical region 04/01/2012    Celyn Rober Minion PT, MPH  11/13/2016, 9:47 AM  [facility] [address] [place], [place], [address] Phone: [phone]   Fax:  [phone]  Name: Tyreon Frigon MRN: [id number] Date of Birth: 1971/02/11

## 2016-11-17 ENCOUNTER — Encounter: Payer: Self-pay | Admitting: Rehabilitative and Restorative Service Providers"

## 2016-11-17 ENCOUNTER — Ambulatory Visit (INDEPENDENT_AMBULATORY_CARE_PROVIDER_SITE_OTHER): Payer: No Typology Code available for payment source | Admitting: Rehabilitative and Restorative Service Providers"

## 2016-11-17 DIAGNOSIS — R2689 Other abnormalities of gait and mobility: Secondary | ICD-10-CM | POA: Diagnosis not present

## 2016-11-17 DIAGNOSIS — M25651 Stiffness of right hip, not elsewhere classified: Secondary | ICD-10-CM | POA: Diagnosis not present

## 2016-11-17 DIAGNOSIS — M25551 Pain in right hip: Secondary | ICD-10-CM | POA: Diagnosis not present

## 2016-11-17 DIAGNOSIS — M6281 Muscle weakness (generalized): Secondary | ICD-10-CM

## 2016-11-17 DIAGNOSIS — R29898 Other symptoms and signs involving the musculoskeletal system: Secondary | ICD-10-CM

## 2016-11-17 NOTE — Therapy (Signed)
[facility] [place] [address] [place], [place], [address] Phone: [phone]   Fax:  [phone]  Physical Therapy Treatment  Patient Details  Name: Jerry Shannon MRN: [id number] Date of Birth: Sep 06, 1970 Referring Provider: Dr Dewitt Rota  Encounter Date: 11/17/2016      PT End of Session - 11/17/16 1102    Visit Number 3   Number of Visits 12   Date for PT Re-Evaluation 12/23/16   PT Start Time 1100   PT Stop Time 1145   PT Time Calculation (min) 45 min   Activity Tolerance Patient tolerated treatment well      Past Medical History:  Diagnosis Date  . Acute bilateral knee pain 05/06/2016  . Back injury   . GERD (gastroesophageal reflux disease)   . Hip fracture, right, sequela 05/14/2016  . History of hiatal hernia     Past Surgical History:  Procedure Laterality Date  . CHONDROPLASTY Left 11/07/2014   Procedure: CHONDROPLASTY;  Surgeon: Jodi Geralds, MD;  Location: [facility];  Service: Orthopedics;  Laterality: Left;  . KNEE ARTHROSCOPY WITH LATERAL MENISECTOMY Left 11/07/2014   Procedure: Knee arthroscopy with lateral menisectomy, with lateral plica;  Surgeon: Jodi Geralds, MD;  Location: [facility];  Service: Orthopedics;  Laterality: Left;  . KNEE SURGERY      There were no vitals filed for this visit.      Subjective Assessment - 11/17/16 1102    Subjective Patient reports that he is doing well. He took a couple of days of from his exercises over the weekend and that seems to have helped with his pain level. He feels that his leg is getting stronger. Drove to [place] yesterday and it was the best driving he has had in months.    Currently in Pain? No/denies            [facility] PT Assessment - 11/17/16 0001      Assessment   Medical Diagnosis Rt THA revision   Referring Provider Dr Dewitt Rota   Onset Date/Surgical Date 08/27/16   Hand Dominance Right   Next MD Visit  02/20/17   Prior Therapy yes after first THA     Precautions   Precautions Posterior Hip   Precaution Comments per MD order, patient reports he was told he could bend forward and touch his toes.      Strength   Right Hip Extension 4+/5   Right Hip ABduction 4+/5  discomfort     Flexibility   Hamstrings supine SLR Rt 75, Lt 58   Quadriceps prone knee flex Rt 120, Lt 123                     [facility] Adult PT Treatment/Exercise - 11/17/16 0001      Knee/Hip Exercises: Aerobic   Stationary Bike L2 x 5 min      Knee/Hip Exercises: Standing   Heel Raises 15 reps   Hip Flexion Limitations alt touch 12 inch step for hip flexion and rec movement pattern    Hip ADduction Strengthening;Right;Left;20 reps   Hip Extension Stengthening;Right;Left;20 reps   Lateral Step Up 2 sets;10 reps;Hand Hold: 1;Step Height: 4"  heel tap down with Lt    Forward Step Up 20 reps;Hand Hold: 0;Step Height: 6"   Step Down 20 reps  stepping up backwards w/ Rt then down w/Lt    Functional Squat 10 reps;3 seconds   SLS with blue therapad 20 sec x  3 reps each LE - UE support as needed    SLS with Vectors with fwd leans x 10   SLS kick back then reach fwd to touch floor x 3 each    Gait Training walking fwd/back/sidesteps ~ 80 ft each      Knee/Hip Exercises: Seated   Sit to Sand 10 reps;without UE support  slow return stand to sit                 PT Education - 11/17/16 1142    Education provided Yes   Education Details HEP encouraged patient to rest appropriately form exercise routine    Person(s) Educated Patient   Methods Explanation   Comprehension Verbalized understanding             PT Long Term Goals - 11/11/16 1432      PT LONG TERM GOAL #1   Title I with HEP 12/23/16)   Time 6   Period Weeks   Status New     PT LONG TERM GOAL #2   Title increaese Rt hip strength =/> 5-/5 ( 12/23/16)    Time 6   Period Weeks   Status New     PT LONG TERM GOAL #3   Title  improve Rt hip ER =/> 50 degrees to allow him to tie his shoes easily. ( 12/23/16)    Time 6   Period Weeks   Status New     PT LONG TERM GOAL #4   Title ambulate on even and uneven surfaces without gait deviations ( 12/23/16)    Time 6   Period Weeks   Status New     PT LONG TERM GOAL #5   Title perform lunges/squats/deadlifts with safe form to allow him to transition to the gym ( 12/23/16)    Time 6   Period Weeks   Status New     Additional Long Term Goals   Additional Long Term Goals Yes     PT LONG TERM GOAL #6   Title improve FOTO =/< 37% limited ( 12/23/16)    Time 6   Period Weeks   Status New               Plan - 11/17/16 1104    Clinical Impression Statement Continued progress with increasing ROM and mobility as well as strength. Gait pattern is improved. Progressing well toward goals of therapy.    Rehab Potential Excellent   PT Frequency 2x / week   PT Duration 6 weeks   PT Treatment/Interventions Moist Heat;Ultrasound;Therapeutic exercise;Dry needling;Taping;Manual techniques;Neuromuscular re-education;Cryotherapy;Electrical Stimulation;Iontophoresis /ml Dexamethasone;Patient/family education;Passive range of motion   PT Next Visit Plan bilat hip ROM for rotation, hip and core strengthening, proprioception, modalities PRN   Consulted and Agree with Plan of Care Patient      Patient will benefit from skilled therapeutic intervention in order to improve the following deficits and impairments:  Decreased strength, Pain, Impaired flexibility, Decreased range of motion, Difficulty walking  Visit Diagnosis: Pain in right hip  Muscle weakness (generalized)  Other abnormalities of gait and mobility  Stiffness of right hip, not elsewhere classified  Other symptoms and signs involving the musculoskeletal system     Problem List Patient Active Problem List   Diagnosis Date Noted  . Disorder of rotator cuff, right 10/27/2016  . Rectal bleeding  10/15/2016  . Arthropathy of hip 07/02/2016  . Seborrheic dermatitis of scalp 04/03/2016  . Alcohol abuse 03/13/2016  . Mood disorder (HCC) 11/06/2015  .  History of arthroplasty of right hip 12/07/2014  . Excessive daytime sleepiness 11/06/2014  . Primary osteoarthritis of both knees 09/21/2014  . Insomnia 09/21/2014  . Erectile dysfunction 09/21/2014  . Hyperlipidemia LDL goal < 100 04/29/2012  . Left lumbar radiculopathy 04/29/2012  . Hypogonadism male 04/29/2012  . Radiculitis of left cervical region 04/01/2012    Kelsye Loomer Rober MinionP Atilla Zollner PT, MPH  11/17/2016, 11:44 AM  [facility] [address] [place] [address] [place], [place], [address] Phone: [phone]   Fax:  [phone]  Name: Elenora FenderLarry Marrocco MRN: [id number] Date of Birth: [birth year]

## 2016-11-20 ENCOUNTER — Ambulatory Visit (INDEPENDENT_AMBULATORY_CARE_PROVIDER_SITE_OTHER): Payer: No Typology Code available for payment source | Admitting: Physical Therapy

## 2016-11-20 DIAGNOSIS — M25551 Pain in right hip: Secondary | ICD-10-CM

## 2016-11-20 DIAGNOSIS — M25651 Stiffness of right hip, not elsewhere classified: Secondary | ICD-10-CM

## 2016-11-20 DIAGNOSIS — R2689 Other abnormalities of gait and mobility: Secondary | ICD-10-CM

## 2016-11-20 DIAGNOSIS — M6281 Muscle weakness (generalized): Secondary | ICD-10-CM | POA: Diagnosis not present

## 2016-11-20 NOTE — Therapy (Signed)
[facility] [place] [address] [place] [address] [place], [place], [address] Phone: [phone]   Fax:  [phone]  Physical Therapy Treatment  Patient Details  Name: Jerry Shannon MRN: [id number] Date of Birth: 09-Dec-1970 Referring Provider: Dr. Dewitt Rota  Encounter Date: 11/20/2016      PT End of Session - 11/20/16 1016    Visit Number 4   Number of Visits 12   Date for PT Re-Evaluation 12/23/16   PT Start Time 1013   PT Stop Time 1056   PT Time Calculation (min) 43 min   Activity Tolerance Patient tolerated treatment well;No increased pain      Past Medical History:  Diagnosis Date  . Acute bilateral knee pain 05/06/2016  . Back injury   . GERD (gastroesophageal reflux disease)   . Hip fracture, right, sequela 05/14/2016  . History of hiatal hernia     Past Surgical History:  Procedure Laterality Date  . CHONDROPLASTY Left 11/07/2014   Procedure: CHONDROPLASTY;  Surgeon: Jodi Geralds, MD;  Location: [facility];  Service: Orthopedics;  Laterality: Left;  . KNEE ARTHROSCOPY WITH LATERAL MENISECTOMY Left 11/07/2014   Procedure: Knee arthroscopy with lateral menisectomy, with lateral plica;  Surgeon: Jodi Geralds, MD;  Location: [facility];  Service: Orthopedics;  Laterality: Left;  . KNEE SURGERY      There were no vitals filed for this visit.      Subjective Assessment - 11/20/16 1014    Subjective Vearl reports he did some exercises at gym the day after last session and then yesterday he couldn't exercise at all yesterday due to leg cramps.  He believes it is the [employer] supplement he is taking.    Currently in Pain? No/denies            [facility] PT Assessment - 11/20/16 0001      Assessment   Medical Diagnosis Rt THA revision   Referring Provider Dr. Dewitt Rota   Onset Date/Surgical Date 08/27/16   Hand Dominance Right   Next MD Visit 02/20/17   Prior Therapy yes after first THA          [facility] Adult PT Treatment/Exercise - 11/20/16 0001      Knee/Hip Exercises: Stretches   Passive Hamstring Stretch 60 seconds;2 reps  supine with strap    Quad Stretch Right;Left;2 reps;60 seconds   Piriformis Stretch Right;3 reps;30 seconds  to work on Haematologist Right;Left;2 reps;30 seconds   Other Knee/Hip Stretches --     Knee/Hip Exercises: Aerobic   Stationary Bike L2 x 5 min      Knee/Hip Exercises: Standing   Hip Abduction Stengthening;Left;Right;10 reps;Knee straight   Abduction Limitations VC to slow speed of exercise   Lateral Step Up Right;1 set;10 reps;Hand Hold: 1  3"   Forward Step Up 20 reps;Hand Hold: 0;Step Height: 6"   Forward Step Up Limitations eccentric control on return   Step Down 20 reps;Step Height: 6"  stepping up backwards w/ Rt then down w/Lt.   Functional Squat 15 reps;3 seconds; 1 reps each leg of semi-tandem stance squat.    SLS Rt/Lt SLS on Bosu x 30 sec x 3 reps wiht occasional UE support   Other Standing Knee Exercises split squats with limited depth x 8 reps each leg.              PT Long Term Goals - 11/20/16 1019      PT LONG TERM  GOAL #1   Title I with HEP 12/23/16)   Time 6   Period Weeks   Status On-going     PT LONG TERM GOAL #2   Title increase Rt hip strength =/> 5-/5 ( 12/23/16)    Time 6   Period Weeks   Status On-going     PT LONG TERM GOAL #3   Title improve Rt hip ER =/> 50 degrees to allow him to tie his shoes easily. ( 12/23/16)    Time 6   Period Weeks   Status On-going     PT LONG TERM GOAL #4   Title ambulate on even and uneven surfaces without gait deviations ( 12/23/16)    Time 6   Period Weeks   Status On-going     PT LONG TERM GOAL #5   Title perform lunges/squats/deadlifts with safe form to allow him to transition to the gym ( 12/23/16)    Time 6   Period Weeks   Status On-going     PT LONG TERM GOAL #6   Title improve FOTO =/< 37% limited ( 12/23/16)    Time 6   Period  Weeks   Status On-going               Plan - 11/20/16 1052    Clinical Impression Statement Pt had some reported soreness/cramping after hard workout the day following last session. This resolved with rest and gentle stretches.  Pt tolerated increased step height with exercises today. He requires freq cues for slowing speed of exercise, and form/posture.  Progressing towards goals.     Rehab Potential Excellent   PT Frequency 2x / week   PT Duration 6 weeks   PT Treatment/Interventions Moist Heat;Ultrasound;Therapeutic exercise;Dry needling;Taping;Manual techniques;Neuromuscular re-education;Cryotherapy;Electrical Stimulation;Iontophoresis /ml Dexamethasone;Patient/family education;Passive range of motion   PT Next Visit Plan Pt will be on work trip; will return at end of next wk.  continue progressive hip flexibility/strengthening.    Consulted and Agree with Plan of Care Patient      Patient will benefit from skilled therapeutic intervention in order to improve the following deficits and impairments:  Decreased strength, Pain, Impaired flexibility, Decreased range of motion, Difficulty walking  Visit Diagnosis: Pain in right hip  Muscle weakness (generalized)  Other abnormalities of gait and mobility  Stiffness of right hip, not elsewhere classified     Problem List Patient Active Problem List   Diagnosis Date Noted  . Disorder of rotator cuff, right 10/27/2016  . Rectal bleeding 10/15/2016  . Arthropathy of hip 07/02/2016  . Seborrheic dermatitis of scalp 04/03/2016  . Alcohol abuse 03/13/2016  . Mood disorder (HCC) 11/06/2015  . History of arthroplasty of right hip 12/07/2014  . Excessive daytime sleepiness 11/06/2014  . Primary osteoarthritis of both knees 09/21/2014  . Insomnia 09/21/2014  . Erectile dysfunction 09/21/2014  . Hyperlipidemia LDL goal < 100 04/29/2012  . Left lumbar radiculopathy 04/29/2012  . Hypogonadism male 04/29/2012  . Radiculitis of  left cervical region 04/01/2012   Mayer Camel, PTA 11/20/16 11:00 AM  [facility] [address] [place] [address] [place], [place], [address] Phone: [phone]   Fax:  [phone]  Name: Xane Amsden MRN: [id number] Date of Birth: Oct 21, 1970

## 2016-11-26 ENCOUNTER — Encounter: Payer: Self-pay | Admitting: Physical Therapy

## 2016-11-27 ENCOUNTER — Ambulatory Visit (INDEPENDENT_AMBULATORY_CARE_PROVIDER_SITE_OTHER): Payer: No Typology Code available for payment source | Admitting: Physical Therapy

## 2016-11-27 DIAGNOSIS — M25551 Pain in right hip: Secondary | ICD-10-CM

## 2016-11-27 DIAGNOSIS — R2689 Other abnormalities of gait and mobility: Secondary | ICD-10-CM

## 2016-11-27 DIAGNOSIS — M6281 Muscle weakness (generalized): Secondary | ICD-10-CM | POA: Diagnosis not present

## 2016-11-27 NOTE — Therapy (Signed)
[facility] [place] [address] [place] [address] [place], [place], [address] Phone: [phone]   Fax:  [phone]  Physical Therapy Treatment  Patient Details  Name: Jerry Shannon MRN: [id number] Date of Birth: [birth year] Referring Provider: Dr. Dewitt Rota  Encounter Date: 11/27/2016      PT End of Session - 11/27/16 0850    Visit Number 5   Number of Visits 12   Date for PT Re-Evaluation 12/23/16   PT Start Time 0848   PT Stop Time 0932   PT Time Calculation (min) 44 min   Activity Tolerance Patient tolerated treatment well;No increased pain   Behavior During Therapy WFL for tasks assessed/performed      Past Medical History:  Diagnosis Date  . Acute bilateral knee pain 05/06/2016  . Back injury   . GERD (gastroesophageal reflux disease)   . Hip fracture, right, sequela 05/14/2016  . History of hiatal hernia     Past Surgical History:  Procedure Laterality Date  . CHONDROPLASTY Left 11/07/2014   Procedure: CHONDROPLASTY;  Surgeon: Jodi Geralds, MD;  Location: [facility];  Service: Orthopedics;  Laterality: Left;  . KNEE ARTHROSCOPY WITH LATERAL MENISECTOMY Left 11/07/2014   Procedure: Knee arthroscopy with lateral menisectomy, with lateral plica;  Surgeon: Jodi Geralds, MD;  Location: [facility];  Service: Orthopedics;  Laterality: Left;  . KNEE SURGERY      There were no vitals filed for this visit.      Subjective Assessment - 11/27/16 0850    Subjective Pt reports he was able to drive to [place]/fly to [facility] with min pain in hip.  He is able to put his socks on a little easier, but complains he cannot tie his shoe or cut his toe nails.  He complains of weakness walking up steps and tightness in Rt inner thigh.    Patient Stated Goals wishes to tie his shoes, improve his flexibility and have a judge on his HEP progression.    Currently in Pain? No/denies            [facility] PT Assessment - 11/27/16  0001      Assessment   Medical Diagnosis Rt THA revision   Referring Provider Dr. Dewitt Rota   Onset Date/Surgical Date 08/27/16   Hand Dominance Right   Next MD Visit 02/20/17     PROM   Right Hip External Rotation  40  fig 4 position     Flexibility   Quadriceps prone: Rt 128  after stretches         [facility] Adult PT Treatment/Exercise - 11/27/16 0001      Knee/Hip Exercises: Stretches   Passive Hamstring Stretch Right;Left;2 reps   Lobbyist Right;Left;2 reps;30 seconds   Piriformis Stretch Right;Left;2 reps;30 seconds   Gastroc Stretch Right;Left;2 reps;30 seconds   Other Knee/Hip Stretches Standing adductor stretch Rt/Lt x 30 sec x 3 reps each      Knee/Hip Exercises: Aerobic   Stationary Bike L1 x 5 min      Knee/Hip Exercises: Standing   Lateral Step Up Right;1 set;15 reps;Hand Hold: 1;Step Height: 6"   Forward Step Up Right;1 set;20 reps;Hand Hold: 2;Step Height: 6"   Forward Step Up Limitations 6 reps, step up 12" with UE support.    SLS SLS on Lt/Rt on upside down Bosu x 30 x 2 reps each    SLS with Vectors Rt SLS forward leans to 12" step x 4 reps, 5 reps on LLE  Other Standing Knee Exercises split squats with limited depth x 10 reps each leg.                      PT Long Term Goals - 11/27/16 2130      PT LONG TERM GOAL #1   Title I with HEP 12/23/16)   Time 6   Period Weeks   Status On-going     PT LONG TERM GOAL #2   Title increase Rt hip strength =/> 5-/5 ( 12/23/16)    Time 6   Period Weeks   Status On-going     PT LONG TERM GOAL #3   Title improve Rt hip ER =/> 50 degrees to allow him to tie his shoes easily. ( 12/23/16)    Time 6   Period Weeks   Status On-going     PT LONG TERM GOAL #4   Title ambulate on even and uneven surfaces without gait deviations ( 12/23/16)    Time 6   Period Weeks   Status On-going     PT LONG TERM GOAL #5   Title perform lunges/squats/deadlifts with safe form to allow him to transition  to the gym ( 12/23/16)    Time 6   Period Weeks   Status On-going     PT LONG TERM GOAL #6   Title improve FOTO =/< 37% limited ( 12/23/16)    Time 6   Period Weeks   Status On-going               Plan - 11/27/16 8657    Clinical Impression Statement Pt's quad flexibility improving, gained 8 deg since last assessment. Rt hip ER has improved as well.  No increase in symptoms other than fatigue with exercise today.  Pt progressing towards established goals.    Rehab Potential Excellent   PT Frequency 2x / week   PT Duration 6 weeks   PT Treatment/Interventions Moist Heat;Ultrasound;Therapeutic exercise;Dry needling;Taping;Manual techniques;Neuromuscular re-education;Cryotherapy;Electrical Stimulation;Iontophoresis /ml Dexamethasone;Patient/family education;Passive range of motion   PT Next Visit Plan  continue progressive hip flexibility/strengthening.    Consulted and Agree with Plan of Care Patient      Patient will benefit from skilled therapeutic intervention in order to improve the following deficits and impairments:  Decreased strength, Pain, Impaired flexibility, Decreased range of motion, Difficulty walking  Visit Diagnosis: Muscle weakness (generalized)  Pain in right hip  Other abnormalities of gait and mobility     Problem List Patient Active Problem List   Diagnosis Date Noted  . Disorder of rotator cuff, right 10/27/2016  . Rectal bleeding 10/15/2016  . Arthropathy of hip 07/02/2016  . Seborrheic dermatitis of scalp 04/03/2016  . Alcohol abuse 03/13/2016  . Mood disorder (HCC) 11/06/2015  . History of arthroplasty of right hip 12/07/2014  . Excessive daytime sleepiness 11/06/2014  . Primary osteoarthritis of both knees 09/21/2014  . Insomnia 09/21/2014  . Erectile dysfunction 09/21/2014  . Hyperlipidemia LDL goal < 100 04/29/2012  . Left lumbar radiculopathy 04/29/2012  . Hypogonadism male 04/29/2012  . Radiculitis of left cervical region  04/01/2012   Mayer Camel, PTA 11/27/16 9:33 AM  [facility] [address] [place] [address] [place], [place], [address] Phone: [phone]   Fax:  [phone]  Name: Laval Cafaro MRN: [id number] Date of Birth: 12-Apr-1970

## 2016-12-01 ENCOUNTER — Encounter: Payer: Self-pay | Admitting: Physical Therapy

## 2016-12-02 ENCOUNTER — Ambulatory Visit (INDEPENDENT_AMBULATORY_CARE_PROVIDER_SITE_OTHER): Payer: No Typology Code available for payment source | Admitting: Physical Therapy

## 2016-12-02 DIAGNOSIS — M25551 Pain in right hip: Secondary | ICD-10-CM

## 2016-12-02 DIAGNOSIS — R2689 Other abnormalities of gait and mobility: Secondary | ICD-10-CM | POA: Diagnosis not present

## 2016-12-02 DIAGNOSIS — M6281 Muscle weakness (generalized): Secondary | ICD-10-CM

## 2016-12-02 NOTE — Therapy (Signed)
[facility] [place] [address] [place], [place], [address] Phone: [phone]   Fax:  [phone]  Physical Therapy Treatment  Patient Details  Name: Jerry Shannon MRN: [id number] Date of Birth: [birth year] Referring Provider: Dr. Dewitt Rota  Encounter Date: 12/02/2016      PT End of Session - 12/02/16 0851    Visit Number 6   Number of Visits 12   Date for PT Re-Evaluation 12/23/16   PT Start Time 0845   PT Stop Time 0929   PT Time Calculation (min) 44 min   Activity Tolerance Patient tolerated treatment well;No increased pain   Behavior During Therapy WFL for tasks assessed/performed      Past Medical History:  Diagnosis Date  . Acute bilateral knee pain 05/06/2016  . Back injury   . GERD (gastroesophageal reflux disease)   . Hip fracture, right, sequela 05/14/2016  . History of hiatal hernia     Past Surgical History:  Procedure Laterality Date  . CHONDROPLASTY Left 11/07/2014   Procedure: CHONDROPLASTY;  Surgeon: Jodi Geralds, MD;  Location: [facility];  Service: Orthopedics;  Laterality: Left;  . KNEE ARTHROSCOPY WITH LATERAL MENISECTOMY Left 11/07/2014   Procedure: Knee arthroscopy with lateral menisectomy, with lateral plica;  Surgeon: Jodi Geralds, MD;  Location: [facility];  Service: Orthopedics;  Laterality: Left;  . KNEE SURGERY      There were no vitals filed for this visit.      Subjective Assessment - 12/02/16 0856    Subjective "My leg strength is not progressing as fast as I'd like it to".   He complains of stiffness in hip when driving an hour or longer.  He has been working on endurance and has had some light rest days.    Patient Stated Goals wishes to tie his shoes, improve his flexibility and have a judge on his HEP progression.    Currently in Pain? No/denies            [facility] PT Assessment - 12/02/16 0001      Assessment   Medical Diagnosis Rt THA revision    Referring Provider Dr. Dewitt Rota   Onset Date/Surgical Date 08/27/16   Hand Dominance Right   Next MD Visit 02/20/17     ROM / Strength   AROM / PROM / Strength AROM;PROM     AROM   AROM Assessment Site Hip   Right/Left Hip Right   Right Hip External Rotation  30     PROM   Right Hip External Rotation  45          [facility] Adult PT Treatment/Exercise - 12/02/16 0001      Knee/Hip Exercises: Stretches   Passive Hamstring Stretch Right;Left;2 reps   Quad Stretch Right;Left;30 seconds;4 reps  2 at beginning, 2 at end   Piriformis Stretch Right;Left;2 reps;30 seconds   Other Knee/Hip Stretches Standing adductor stretch Rt/Lt x 30 sec x 3 reps each      Knee/Hip Exercises: Aerobic   Stationary Bike L2 x 5 min    Other Aerobic lap around gym between exercises to decrease stiffness.      Knee/Hip Exercises: Standing   Stairs 20 stairs reciprocal, slow and controlled motion,    SLS SLS on blue pad with toe taps to 13" step  x 8 (too easy);  Rt SLS forward leans to chair height x 10 reps (improved form)   Other Standing Knee Exercises squat to single leg hip  abdct x 8 each leg.    Other Standing Knee Exercises split squat x 8 reps (fatigued quickly)     Knee/Hip Exercises: Supine   Bridges Limitations bridge with 10# - 10 sec holds in ext x 10     Knee/Hip Exercises: Prone   Straight Leg Raises Strengthening;Right;Left;1 set;10 reps  opp arm/leg                     PT Long Term Goals - 12/02/16 0917      PT LONG TERM GOAL #1   Title I with HEP 12/23/16)   Time 6   Period Weeks   Status On-going     PT LONG TERM GOAL #2   Title increase Rt hip strength =/> 5-/5 ( 12/23/16)    Time 6   Period Weeks   Status On-going     PT LONG TERM GOAL #3   Title improve Rt hip ER =/> 50 degrees to allow him to tie his shoes easily. ( 12/23/16)    Time 6   Period Weeks   Status On-going     PT LONG TERM GOAL #4   Title ambulate on even and uneven surfaces  without gait deviations ( 12/23/16)    Time 6   Period Weeks   Status On-going  improving     PT LONG TERM GOAL #5   Title perform lunges/squats/deadlifts with safe form to allow him to transition to the gym ( 12/23/16)    Time 6   Period Weeks   Status On-going     PT LONG TERM GOAL #6   Title improve FOTO =/< 37% limited ( 12/23/16)    Time 6   Period Weeks   Status On-going               Plan - 12/02/16 1324    Clinical Impression Statement Pt's hip ER ROM improving, as well as his gait.  He tolerated all exercises without increase in pain.  He fatigues quickly with standing quad/hamstring strengthening, requiring some walking rest breaks. He requires freq cues for form with squats. Pt progressing towards goals.    Rehab Potential Excellent   PT Frequency 2x / week   PT Duration 6 weeks   PT Treatment/Interventions Moist Heat;Ultrasound;Therapeutic exercise;Dry needling;Taping;Manual techniques;Neuromuscular re-education;Cryotherapy;Electrical Stimulation;Iontophoresis /ml Dexamethasone;Patient/family education;Passive range of motion   PT Next Visit Plan Measure hip strength.  Continue progressive hip flexibility and strengthening.    Consulted and Agree with Plan of Care Patient      Patient will benefit from skilled therapeutic intervention in order to improve the following deficits and impairments:  Decreased strength, Pain, Impaired flexibility, Decreased range of motion, Difficulty walking  Visit Diagnosis: Muscle weakness (generalized)  Pain in right hip  Other abnormalities of gait and mobility     Problem List Patient Active Problem List   Diagnosis Date Noted  . Disorder of rotator cuff, right 10/27/2016  . Rectal bleeding 10/15/2016  . Arthropathy of hip 07/02/2016  . Seborrheic dermatitis of scalp 04/03/2016  . Alcohol abuse 03/13/2016  . Mood disorder (HCC) 11/06/2015  . History of arthroplasty of right hip 12/07/2014  . Excessive daytime  sleepiness 11/06/2014  . Primary osteoarthritis of both knees 09/21/2014  . Insomnia 09/21/2014  . Erectile dysfunction 09/21/2014  . Hyperlipidemia LDL goal < 100 04/29/2012  . Left lumbar radiculopathy 04/29/2012  . Hypogonadism male 04/29/2012  . Radiculitis of left cervical region 04/01/2012   Mayer Camel, PTA  12/02/16 9:31 AM  [facility] [place] [address] [place] [address] [place], [place], [address] Phone: [phone]   Fax:  [phone]  Name: Jerry Shannon MRN: [id number] Date of Birth: 07-Nov-1970

## 2016-12-03 ENCOUNTER — Encounter: Payer: Self-pay | Admitting: Physical Therapy

## 2016-12-03 ENCOUNTER — Ambulatory Visit (INDEPENDENT_AMBULATORY_CARE_PROVIDER_SITE_OTHER): Payer: No Typology Code available for payment source | Admitting: Physical Therapy

## 2016-12-03 DIAGNOSIS — R2689 Other abnormalities of gait and mobility: Secondary | ICD-10-CM

## 2016-12-03 DIAGNOSIS — M25551 Pain in right hip: Secondary | ICD-10-CM | POA: Diagnosis not present

## 2016-12-03 DIAGNOSIS — M6281 Muscle weakness (generalized): Secondary | ICD-10-CM | POA: Diagnosis not present

## 2016-12-03 DIAGNOSIS — M25651 Stiffness of right hip, not elsewhere classified: Secondary | ICD-10-CM | POA: Diagnosis not present

## 2016-12-03 NOTE — Therapy (Signed)
[facility] [place] [address] [place], [place], [address] Phone: [phone]   Fax:  [phone]  Physical Therapy Treatment  Patient Details  Name: Jerry Shannon MRN: [id number] Date of Birth: [birth year] Referring Provider: Dr. Dewitt Rota  Encounter Date: 12/03/2016      PT End of Session - 12/03/16 1013    Visit Number 7   Number of Visits 12   Date for PT Re-Evaluation 12/23/16   PT Start Time 1013   PT Stop Time 1054   PT Time Calculation (min) 41 min   Activity Tolerance Patient tolerated treatment well      Past Medical History:  Diagnosis Date  . Acute bilateral knee pain 05/06/2016  . Back injury   . GERD (gastroesophageal reflux disease)   . Hip fracture, right, sequela 05/14/2016  . History of hiatal hernia     Past Surgical History:  Procedure Laterality Date  . CHONDROPLASTY Left 11/07/2014   Procedure: CHONDROPLASTY;  Surgeon: Jodi Geralds, MD;  Location: [facility];  Service: Orthopedics;  Laterality: Left;  . KNEE ARTHROSCOPY WITH LATERAL MENISECTOMY Left 11/07/2014   Procedure: Knee arthroscopy with lateral menisectomy, with lateral plica;  Surgeon: Jodi Geralds, MD;  Location: [facility];  Service: Orthopedics;  Laterality: Left;  . KNEE SURGERY      There were no vitals filed for this visit.      Subjective Assessment - 12/02/16 0856    Subjective "My leg strength is not progressing as fast as I'd like it to".   He complains of stiffness in hip when driving an hour or longer.  He has been working on endurance and has had some light rest days.    Patient Stated Goals wishes to tie his shoes, improve his flexibility and have a judge on his HEP progression.    Currently in Pain? No/denies                         [facility] Adult PT Treatment/Exercise - 12/03/16 0001      Knee/Hip Exercises: Aerobic   Stationary Bike L3 x 5 min      Knee/Hip Exercises:  Standing   Forward Lunges Both;3 sets;10 reps  dropping knee to 6" step, VC for form     Manual Therapy   Manual Therapy Soft tissue mobilization;Passive ROM;Joint mobilization   Joint Mobilization PA rt hip mobs 5 x30 sec bouts, 2x30 sec posterior mobs, hip distraction with strap hip at 90 degrees.     Passive ROM Rt hip ER/IR in prone with pelvis stabilized                      PT Long Term Goals - 12/02/16 0917      PT LONG TERM GOAL #1   Title I with HEP 12/23/16)   Time 6   Period Weeks   Status On-going     PT LONG TERM GOAL #2   Title increase Rt hip strength =/> 5-/5 ( 12/23/16)    Time 6   Period Weeks   Status On-going     PT LONG TERM GOAL #3   Title improve Rt hip ER =/> 50 degrees to allow him to tie his shoes easily. ( 12/23/16)    Time 6   Period Weeks   Status On-going     PT LONG TERM GOAL #4   Title ambulate on even and uneven surfaces without  gait deviations ( 12/23/16)    Time 6   Period Weeks   Status On-going  improving     PT LONG TERM GOAL #5   Title perform lunges/squats/deadlifts with safe form to allow him to transition to the gym ( 12/23/16)    Time 6   Period Weeks   Status On-going     PT LONG TERM GOAL #6   Title improve FOTO =/< 37% limited ( 12/23/16)    Time 6   Period Weeks   Status On-going               Plan - 12/03/16 1054    Clinical Impression Statement Yazir tolerated manual work well today. He had some releases and picked up ROM afterwards,  The prone position did flare up some of his back pain unfortunately.  He states he will heat it at home.    Rehab Potential Excellent   PT Frequency 2x / week   PT Duration 6 weeks   PT Treatment/Interventions Moist Heat;Ultrasound;Therapeutic exercise;Dry needling;Taping;Manual techniques;Neuromuscular re-education;Cryotherapy;Electrical Stimulation;Iontophoresis /ml Dexamethasone;Patient/family education;Passive range of motion   PT Next Visit Plan   Continue progressive hip flexibility and strengthening.    Consulted and Agree with Plan of Care Patient      Patient will benefit from skilled therapeutic intervention in order to improve the following deficits and impairments:  Decreased strength, Pain, Impaired flexibility, Decreased range of motion, Difficulty walking  Visit Diagnosis: Muscle weakness (generalized)  Pain in right hip  Other abnormalities of gait and mobility  Stiffness of right hip, not elsewhere classified     Problem List Patient Active Problem List   Diagnosis Date Noted  . Disorder of rotator cuff, right 10/27/2016  . Rectal bleeding 10/15/2016  . Arthropathy of hip 07/02/2016  . Seborrheic dermatitis of scalp 04/03/2016  . Alcohol abuse 03/13/2016  . Mood disorder (HCC) 11/06/2015  . History of arthroplasty of right hip 12/07/2014  . Excessive daytime sleepiness 11/06/2014  . Primary osteoarthritis of both knees 09/21/2014  . Insomnia 09/21/2014  . Erectile dysfunction 09/21/2014  . Hyperlipidemia LDL goal < 100 04/29/2012  . Left lumbar radiculopathy 04/29/2012  . Hypogonadism male 04/29/2012  . Radiculitis of left cervical region 04/01/2012    Darl Pikes shaver PT 12/03/2016, 11:00 AM  [facility] [address] [place], [place], [address] Phone: [phone]   Fax:  [phone]  Name: Jerry Shannon MRN: [id number] Date of Birth: [birth year]

## 2016-12-08 ENCOUNTER — Encounter: Payer: Self-pay | Admitting: Physical Therapy

## 2016-12-11 ENCOUNTER — Ambulatory Visit (INDEPENDENT_AMBULATORY_CARE_PROVIDER_SITE_OTHER): Payer: No Typology Code available for payment source | Admitting: Physical Therapy

## 2016-12-11 DIAGNOSIS — M6281 Muscle weakness (generalized): Secondary | ICD-10-CM | POA: Diagnosis not present

## 2016-12-11 DIAGNOSIS — M25651 Stiffness of right hip, not elsewhere classified: Secondary | ICD-10-CM

## 2016-12-11 DIAGNOSIS — M25551 Pain in right hip: Secondary | ICD-10-CM

## 2016-12-11 NOTE — Therapy (Signed)
[place], [place], [address] Phone: [phone]   Fax:  [phone]  Physical Therapy Treatment  Patient Details  Name: Jerry Shannon MRN: [id number] Date of Birth: [birth year] Referring Provider: Dr. Rocky Link  Encounter Date: 12/11/2016      PT End of Session - 12/11/16 1100    Visit Number 8   Number of Visits 12   Date for PT Re-Evaluation 12/23/16   PT Start Time 1017   PT Stop Time 1101   PT Time Calculation (min) 44 min   Activity Tolerance Patient tolerated treatment well   Behavior During Therapy [facility] for tasks assessed/performed      Past Medical History:  Diagnosis Date  . Acute bilateral knee pain 05/06/2016  . Back injury   . GERD (gastroesophageal reflux disease)   . Hip fracture, right, sequela 05/14/2016  . History of hiatal hernia     Past Surgical History:  Procedure Laterality Date  . CHONDROPLASTY Left 11/07/2014   Procedure: CHONDROPLASTY;  Surgeon: Dorna Leitz, MD;  Location: [place];  Service: Orthopedics;  Laterality: Left;  . KNEE ARTHROSCOPY WITH LATERAL MENISECTOMY Left 11/07/2014   Procedure: Knee arthroscopy with lateral menisectomy, with lateral plica;  Surgeon: Dorna Leitz, MD;  Location: [place];  Service: Orthopedics;  Laterality: Left;  . KNEE SURGERY      There were no vitals filed for this visit.      Subjective Assessment - 12/11/16 1146    Subjective Pt reports he felt great after last visit.  He wants to continue working on flexibility.  He is able to cut his toe nails now.  He can perform squats and lunges at gym.  He is pleased with progress thus far.    Currently in Pain? No/denies            [facility] PT Assessment - 12/11/16 0001      Assessment   Medical Diagnosis Rt THA revision   Referring Provider Dr. Rocky Link   Onset Date/Surgical Date 08/27/16     AROM   Right Hip External Rotation  36   prone, RLE rotated     PROM   Right Hip External Rotation  52  hooklying, fig 4 position     Strength   Right/Left Hip Right   Right Hip Extension --  5-/5   Right Hip ABduction --  5-/5   Right Hip ADduction --  5-/5           [facility] Adult PT Treatment/Exercise - 12/11/16 0001      Knee/Hip Exercises: Stretches   Active Hamstring Stretch Right;Left;4 reps  PNF stretching with therapist, 5 sec holds.    Other Knee/Hip Stretches therapist assisted stretches for RLE into hip abd, flex,ER x 30 sec x 3 reps.  Happy baby pose with therapist assist then towel assist x 5 reps.    Other Knee/Hip Stretches modified triangle pose Rt, x 10 sec x 2 attempts; too difficult. Modified downward dog with hands on mat, then on counter (improved form) - multiple cues and demo for technique x 15-20 sec x 2 reps     Knee/Hip Exercises: Aerobic   Stationary Bike L3 x 3.5 min, L2 x 2.5 min    Other Aerobic lap around gym between exercises to decrease stiffness.      Knee/Hip Exercises: Standing   SLS Rt/Lt SLS on upside down bosu x 30-60 sec with head turns and occasional  UE support.    Other Standing Knee Exercises Pt demo 2 reps of simulated dead lift; good form.    Other Standing Knee Exercises split squat to 8" step x 2 each leg, then knee to floor x 2 reps                 PT Education - 12/11/16 1100    Education provided Yes   Education Details HEP             PT Long Term Goals - 12/11/16 1031      PT LONG TERM GOAL #1   Title I with HEP 12/23/16)   Time 6   Period Weeks   Status On-going     PT LONG TERM GOAL #2   Title increase Rt hip strength =/> 5-/5 ( 12/23/16)    Time 6   Period Weeks   Status Achieved     PT LONG TERM GOAL #3   Title improve Rt hip ER =/> 50 degrees to allow him to tie his shoes easily. ( 12/23/16)    Time 6   Period Weeks   Status Achieved     PT LONG TERM GOAL #4   Title ambulate on even and uneven surfaces without gait  deviations ( 12/23/16)    Time 6   Period Weeks   Status Partially Met     PT LONG TERM GOAL #5   Title perform lunges/squats/deadlifts with safe form to allow him to transition to the gym ( 12/23/16)    Time 6   Period Weeks   Status Partially Met  per pt, performing all these activities at gym.  req cues on lunges.     PT LONG TERM GOAL #6   Title improve FOTO =/< 37% limited ( 12/23/16)    Time 6   Period Weeks   Status On-going               Plan - 12/11/16 1142    Clinical Impression Statement Pt demonstrated improved Rt hip strength; has met LTG #2.  He has partially met LTGs #4 and 5.  His Rt hip passive hip ER was 52 deg; has met LTG #3.  He tolerated therapy well, reporting reduction of stiffness by end of session.    Rehab Potential Excellent   PT Frequency 2x / week   PT Duration 6 weeks   PT Treatment/Interventions Moist Heat;Ultrasound;Therapeutic exercise;Dry needling;Taping;Manual techniques;Neuromuscular re-education;Cryotherapy;Electrical Stimulation;Iontophoresis 73m/ml Dexamethasone;Patient/family education;Passive range of motion   PT Next Visit Plan  Continue progressive hip flexibility and strengthening.       Patient will benefit from skilled therapeutic intervention in order to improve the following deficits and impairments:  Decreased strength, Pain, Impaired flexibility, Decreased range of motion, Difficulty walking  Visit Diagnosis: Muscle weakness (generalized)  Stiffness of right hip, not elsewhere classified  Pain in right hip     Problem List Patient Active Problem List   Diagnosis Date Noted  . Disorder of rotator cuff, right 10/27/2016  . Rectal bleeding 10/15/2016  . Arthropathy of hip 07/02/2016  . Seborrheic dermatitis of scalp 04/03/2016  . Alcohol abuse 03/13/2016  . Mood disorder ([place] 11/06/2015  . History of arthroplasty of right hip 12/07/2014  . Excessive daytime sleepiness 11/06/2014  . Primary osteoarthritis of  both knees 09/21/2014  . Insomnia 09/21/2014  . Erectile dysfunction 09/21/2014  . Hyperlipidemia LDL goal < 100 04/29/2012  . Left lumbar radiculopathy 04/29/2012  . Hypogonadism male 04/29/2012  .  Radiculitis of left cervical region 04/01/2012   Kerin Perna, PTA 12/11/16 11:56 AM  [place] [facility] [place], [place], [address] Phone: [phone]   Fax:  [phone]  Name: Jerry Shannon MRN: [id number] Date of Birth: May 30, 1970

## 2016-12-11 NOTE — Patient Instructions (Signed)
[email or web address]  Use towel around feet to assist.   Flexion: Stretch - Hamstrings (Supine) Variations for 4a  - USE DOOR FRAME    Variation: Perform with foot pointed out to side. Perform with foot pointed slightly in. Helper may kneel on bed, using shoulder to support patient's leg. Do not bend hip past 90. Bend resting leg. Contract method: Resist __5_ seconds. (see card G.G.-14) CAUTION: Do not allow knee to go beyond straight.

## 2016-12-15 ENCOUNTER — Ambulatory Visit (INDEPENDENT_AMBULATORY_CARE_PROVIDER_SITE_OTHER): Payer: No Typology Code available for payment source | Admitting: Physical Therapy

## 2016-12-15 DIAGNOSIS — M25651 Stiffness of right hip, not elsewhere classified: Secondary | ICD-10-CM

## 2016-12-15 DIAGNOSIS — M6281 Muscle weakness (generalized): Secondary | ICD-10-CM | POA: Diagnosis not present

## 2016-12-15 NOTE — Therapy (Addendum)
[facility]  [place], [place], [address] Phone: [phone]   Fax:  [phone]  Physical Therapy Treatment  Patient Details  Name: Jerry Shannon MRN: [id number] Date of Birth: Nov 24, 1970 Referring Provider: Dr. Rocky Link  Encounter Date: 12/15/2016      PT End of Session - 12/15/16 0950    Visit Number 9   Number of Visits 12   Date for PT Re-Evaluation 12/23/16   PT Start Time 0928   PT Stop Time 1012   PT Time Calculation (min) 44 min      Past Medical History:  Diagnosis Date  . Acute bilateral knee pain 05/06/2016  . Back injury   . GERD (gastroesophageal reflux disease)   . Hip fracture, right, sequela 05/14/2016  . History of hiatal hernia     Past Surgical History:  Procedure Laterality Date  . CHONDROPLASTY Left 11/07/2014   Procedure: CHONDROPLASTY;  Surgeon: Dorna Leitz, MD;  Location: [facility];  Service: Orthopedics;  Laterality: Left;  . KNEE ARTHROSCOPY WITH LATERAL MENISECTOMY Left 11/07/2014   Procedure: Knee arthroscopy with lateral menisectomy, with lateral plica;  Surgeon: Dorna Leitz, MD;  Location: [facility];  Service: Orthopedics;  Laterality: Left;  . KNEE SURGERY      There were no vitals filed for this visit.      Subjective Assessment - 12/15/16 0950    Subjective Pt reports his flexibility is improving; able to cut toe nails and get shoes on. He is having less pain with driving/flying. He feels his next appt may be his last (able to continue independently)   Currently in Pain? No/denies            [facility] PT Assessment - 12/15/16 0001      AROM   Right Hip External Rotation  38 prone, RLE rotated after stretches     PROM   Right Hip External Rotation  52  hooklying, fig 4 position     Flexibility   Hamstrings supine SLR Rt 78, Lt 67  after stretches          [facility] Adult PT Treatment/Exercise - 12/15/16 0001       Knee/Hip Exercises: Stretches   Active Hamstring Stretch Right;Left;4 reps  PNF stretching with therapist, 5 sec holds.    Quad Stretch Right;Left;30 seconds;4 reps  2 standing at beginning; 2 prone at end.   Piriformis Stretch Right;2 reps;60 seconds   Piriformis Stretch Limitations PNF contract/relax x 3 reps    Gastroc Stretch Right;Left;2 reps;30 seconds   Other Knee/Hip Stretches standing adductor stretch x 2 reps each leg, 30 sec.    Other Knee/Hip Stretches modified downward dog with hands on counter x 3 trials, multiple cues for form.      Knee/Hip Exercises: Aerobic   Nustep L5: 5 min    Other Aerobic lap around gym between exercises to decrease stiffness.      Knee/Hip Exercises: Standing   Stairs 13 stairs - eccentric descending x 3, up 2 steps at a time x 3 reps.  up backwards 6" step with RLE x 13 steps.      Knee/Hip Exercises: Sidelying   Other Sidelying Knee/Hip Exercises 2 reps, on elbow and straight legs x 8 sec hold each       Knee/Hip Exercises: Prone   Hip Extension Strengthening;Right;10 reps;Left;1 set  knee bent  PT Long Term Goals - 12/11/16 1031      PT LONG TERM GOAL #1   Title I with HEP 12/23/16)   Time 6   Period Weeks   Status On-going     PT LONG TERM GOAL #2   Title increase Rt hip strength =/> 5-/5 ( 12/23/16)    Time 6   Period Weeks   Status Achieved     PT LONG TERM GOAL #3   Title improve Rt hip ER =/> 50 degrees to allow him to tie his shoes easily. ( 12/23/16)    Time 6   Period Weeks   Status Achieved     PT LONG TERM GOAL #4   Title ambulate on even and uneven surfaces without gait deviations ( 12/23/16)    Time 6   Period Weeks   Status Partially Met     PT LONG TERM GOAL #5   Title perform lunges/squats/deadlifts with safe form to allow him to transition to the gym ( 12/23/16)    Time 6   Period Weeks   Status Partially Met  per pt, performing all these activities at gym.  req cues on  lunges.     PT LONG TERM GOAL #6   Title improve FOTO =/< 37% limited ( 12/23/16)    Time 6   Period Weeks   Status On-going               Plan - 12/15/16 1251    Clinical Impression Statement Pt demonstrated improved LE flexibility.  He tolerated all exercises except modified downward dog; unable to straighten back if legs are straight.  Making good progress towards remaining goals.  Pt verbalized readiness to d/c after next session.    Rehab Potential Excellent   PT Frequency 2x / week   PT Duration 6 weeks   PT Treatment/Interventions Moist Heat;Ultrasound;Therapeutic exercise;Dry needling;Taping;Manual techniques;Neuromuscular re-education;Cryotherapy;Electrical Stimulation;Iontophoresis 59m/ml Dexamethasone;Patient/family education;Passive range of motion   PT Next Visit Plan FOTO, assess goals.  Progress HEP as tolerated .   Consulted and Agree with Plan of Care Patient      Patient will benefit from skilled therapeutic intervention in order to improve the following deficits and impairments:  Decreased strength, Pain, Impaired flexibility, Decreased range of motion, Difficulty walking  Visit Diagnosis: Stiffness of right hip, not elsewhere classified  Muscle weakness (generalized)     Problem List Patient Active Problem List   Diagnosis Date Noted  . Disorder of rotator cuff, right 10/27/2016  . Rectal bleeding 10/15/2016  . Arthropathy of hip 07/02/2016  . Seborrheic dermatitis of scalp 04/03/2016  . Alcohol abuse 03/13/2016  . Mood disorder ([place] 11/06/2015  . History of arthroplasty of right hip 12/07/2014  . Excessive daytime sleepiness 11/06/2014  . Primary osteoarthritis of both knees 09/21/2014  . Insomnia 09/21/2014  . Erectile dysfunction 09/21/2014  . Hyperlipidemia LDL goal < 100 04/29/2012  . Left lumbar radiculopathy 04/29/2012  . Hypogonadism male 04/29/2012  . Radiculitis of left cervical region 04/01/2012   JKerin Perna  PTA 12/15/16 12:54 PM  [email or web address] [address]Phone: [phone]  Fax:  [phone] Name: LHelix LafontaineMRN: [id number] of Birth: [birth year]  PHYSICAL THERAPY DISCHARGE SUMMARY  Visits from Start of Care: 9 Current functional level related to goals / functional outcomes: unknown   Remaining deficits: unknown   Education / Equipment: HEP Plan:  Patient goals were partially met. Patient is being discharged due to not returning since the last visit.  ?????     Jeral Pinch, PT 02/09/17 10:59 AM

## 2016-12-25 ENCOUNTER — Encounter: Payer: Self-pay | Admitting: Sports Medicine

## 2016-12-25 ENCOUNTER — Ambulatory Visit (INDEPENDENT_AMBULATORY_CARE_PROVIDER_SITE_OTHER): Payer: No Typology Code available for payment source | Admitting: Sports Medicine

## 2016-12-25 DIAGNOSIS — M67911 Unspecified disorder of synovium and tendon, right shoulder: Secondary | ICD-10-CM | POA: Diagnosis not present

## 2016-12-25 DIAGNOSIS — Z0000 Encounter for general adult medical examination without abnormal findings: Secondary | ICD-10-CM | POA: Diagnosis not present

## 2016-12-25 NOTE — Assessment & Plan Note (Signed)
Doing well now after injection, return as needed.

## 2016-12-25 NOTE — Progress Notes (Signed)
  Subjective:    CC: Follow-up  HPI: Right rotator cuff dysfunction: Resolved after injection, rehab, he has started weight lifting again but less intensely.  Past medical history:  Negative.  See flowsheet/record as well for more information.  Surgical history: Negative.  See flowsheet/record as well for more information.  Family history: Negative.  See flowsheet/record as well for more information.  Social history: Negative.  See flowsheet/record as well for more information.  Allergies, and medications have been entered into the medical record, reviewed, and no changes needed.   Review of Systems: No fevers, chills, night sweats, weight loss, chest pain, or shortness of breath.   Objective:    General: Well Developed, well nourished, and in no acute distress.  Neuro: Alert and oriented x3, extra-ocular muscles intact, sensation grossly intact.  HEENT: Normocephalic, atraumatic, pupils equal round reactive to light, neck supple, no masses, no lymphadenopathy, thyroid nonpalpable.  Skin: Warm and dry, no rashes. Cardiac: Regular rate and rhythm, no murmurs rubs or gallops, no lower extremity edema.  Respiratory: Clear to auscultation bilaterally. Not using accessory muscles, speaking in full sentences.  Impression and Recommendations:    Disorder of rotator cuff, right Doing well now after injection, return as needed.  Annual physical exam Return early 2019 for annual physical, fasting.  ___________________________________________ Ihor Austinhomas J. Benjamin Stainhekkekandam, M.D., ABFM., CAQSM. Primary Care and Sports Medicine [place] [facility]  Adjunct Instructor of Family Medicine  [employer]

## 2016-12-25 NOTE — Assessment & Plan Note (Signed)
Return early 2019 for annual physical, fasting.

## 2016-12-28 ENCOUNTER — Telehealth: Payer: Self-pay | Admitting: Sports Medicine

## 2016-12-28 NOTE — Telephone Encounter (Signed)
Patient called request to know if the injection he got in the shoulder on 11/13/16 did it need approval before hand. Pt states his insurance is really crazy and wont pay for nothing. Pt req to get a call back from you about that injection [phone]

## 2016-12-28 NOTE — Telephone Encounter (Signed)
Spoke with Pt, no further questions.

## 2017-03-15 ENCOUNTER — Encounter: Payer: Self-pay | Admitting: Sports Medicine

## 2017-03-15 ENCOUNTER — Ambulatory Visit (INDEPENDENT_AMBULATORY_CARE_PROVIDER_SITE_OTHER): Payer: No Typology Code available for payment source | Admitting: Sports Medicine

## 2017-03-15 VITALS — BP 114/73 | HR 111 | Resp 16 | Ht 67.0 in | Wt 206.1 lb

## 2017-03-15 DIAGNOSIS — E785 Hyperlipidemia, unspecified: Secondary | ICD-10-CM | POA: Diagnosis not present

## 2017-03-15 DIAGNOSIS — Z0000 Encounter for general adult medical examination without abnormal findings: Secondary | ICD-10-CM

## 2017-03-15 DIAGNOSIS — Z23 Encounter for immunization: Secondary | ICD-10-CM | POA: Diagnosis not present

## 2017-03-15 DIAGNOSIS — M1610 Unilateral primary osteoarthritis, unspecified hip: Secondary | ICD-10-CM | POA: Diagnosis not present

## 2017-03-15 MED ORDER — CYCLOBENZAPRINE HCL 5 MG PO TABS: 5.0000 mg | ORAL_TABLET | Freq: Every day | ORAL | 1 refills | Status: DC

## 2017-03-15 NOTE — Progress Notes (Signed)
Subjective:    CC: Annual physical exam  HPI:  Jerry Shannon returns for his physical, he is doing well, has no complaints.  He does need a refill on his muscle relaxer.  Hip arthroplasty: Had a low-grade infection, needs to get his CBC, CRP, ESR rechecked.  Hyperlipidemia: Total cholesterol was high, LDL over 170.  I reviewed the past medical history, family history, social history, surgical history, and allergies today and no changes were needed.  Please see the problem list section below in epic for further details.  Past Medical History: Past Medical History:  Diagnosis Date  . Acute bilateral knee pain 05/06/2016  . Back injury   . GERD (gastroesophageal reflux disease)   . Hip fracture, right, sequela 05/14/2016  . History of hiatal hernia    Past Surgical History: Past Surgical History:  Procedure Laterality Date  . CHONDROPLASTY Left 11/07/2014   Procedure: CHONDROPLASTY;  Surgeon: Dorna Leitz, MD;  Location: [facility];  Service: Orthopedics;  Laterality: Left;  . KNEE ARTHROSCOPY WITH LATERAL MENISECTOMY Left 11/07/2014   Procedure: Knee arthroscopy with lateral menisectomy, with lateral plica;  Surgeon: Dorna Leitz, MD;  Location: [facility];  Service: Orthopedics;  Laterality: Left;  . KNEE SURGERY     Social History: Social History   Socioeconomic History  . Marital status: Single    Spouse name: None  . Number of children: None  . Years of education: None  . Highest education level: None  Social Needs  . Financial resource strain: None  . Food insecurity - worry: None  . Food insecurity - inability: None  . Transportation needs - medical: None  . Transportation needs - non-medical: None  Occupational History  . None  Tobacco Use  . Smoking status: Never Smoker  . Smokeless tobacco: Current User    Types: Chew  Substance and Sexual Activity  . Alcohol use: No    Comment: Quit 7 weeks ago  . Drug use: No  . Sexual activity: Yes     Partners: Female  Other Topics Concern  . None  Social History Narrative  . None   Family History: No family history on file. Allergies: No Known Allergies Medications: See med rec.  Review of Systems: No headache, visual changes, nausea, vomiting, diarrhea, constipation, dizziness, abdominal pain, skin rash, fevers, chills, night sweats, swollen lymph nodes, weight loss, chest pain, body aches, joint swelling, muscle aches, shortness of breath, mood changes, visual or auditory hallucinations.  Objective:    General: Well Developed, well nourished, and in no acute distress.  Neuro: Alert and oriented x3, extra-ocular muscles intact, sensation grossly intact. Cranial nerves II through XII are intact, motor, sensory, and coordinative functions are all intact. HEENT: Normocephalic, atraumatic, pupils equal round reactive to light, neck supple, no masses, no lymphadenopathy, thyroid nonpalpable. Oropharynx, nasopharynx, external ear canals are unremarkable. Skin: Warm and dry, no rashes noted.  Cardiac: Regular rate and rhythm, no murmurs rubs or gallops.  Respiratory: Clear to auscultation bilaterally. Not using accessory muscles, speaking in full sentences.  Abdominal: Soft, nontender, nondistended, positive bowel sounds, no masses, no organomegaly.  Musculoskeletal: Shoulder, elbow, wrist, hip, knee, ankle stable, and with full range of motion.  Impression and Recommendations:    The patient was counselled, risk factors were discussed, anticipatory guidance given.  Annual physical exam Annual physical as above, Tdap, routine labs.  Arthropathy of hip Did have a low-grade infection. Adding CRP, ESR.  Hyperlipidemia with target low density lipoprotein (LDL) cholesterol  less than 100 mg/dL Recent lipid panel from August showed an elevated total cholesterol, LDL in the 170s, triglycerides at 150. Rechecking lipid panel. If still high he would like to be given an additional 2  months to work on diet and exercise, before trying medication.   ___________________________________________ Gwen Her. Dianah Field, M.D., ABFM., CAQSM. Primary Care and [place] Instructor of [place] [employer] [facility] [employer]

## 2017-03-15 NOTE — Assessment & Plan Note (Signed)
Recent lipid panel from August showed an elevated total cholesterol, LDL in the 170s, triglycerides at 150. Rechecking lipid panel. If still high he would like to be given an additional 2 months to work on diet and exercise, before trying medication.

## 2017-03-15 NOTE — Assessment & Plan Note (Signed)
Did have a low-grade infection. Adding CRP, ESR.

## 2017-03-15 NOTE — Assessment & Plan Note (Signed)
Annual physical as above, Tdap, routine labs.

## 2017-03-17 LAB — CBC
HCT: 46.1 % (ref 38.5–50.0)
Hemoglobin: 16.4 g/dL (ref 13.2–17.1)
MCH: 33.3 pg — ABNORMAL HIGH (ref 27.0–33.0)
MCHC: 35.6 g/dL (ref 32.0–36.0)
MCV: 93.5 fL (ref 80.0–100.0)
MPV: 8.8 fL (ref 7.5–12.5)
Platelets: 338 Thousand/uL (ref 140–400)
RBC: 4.93 10*6/uL (ref 4.20–5.80)
RDW: 12 % (ref 11.0–15.0)
WBC: 6.4 10*3/uL (ref 3.8–10.8)

## 2017-03-17 LAB — LIPID PANEL W/REFLEX DIRECT LDL
Cholesterol: 338 mg/dL — ABNORMAL HIGH (ref <200)
HDL: 23 mg/dL — ABNORMAL LOW (ref >40)
Non-HDL Cholesterol (Calc): 315 mg/dL — ABNORMAL HIGH (ref <130)
Total CHOL/HDL Ratio: 14.7 (calc) — ABNORMAL HIGH (ref <5.0)
Triglycerides: 507 mg/dL — ABNORMAL HIGH (ref <150)

## 2017-03-17 LAB — COMPREHENSIVE METABOLIC PANEL WITH GFR
AST: 29 U/L (ref 10–40)
Alkaline phosphatase (APISO): 63 U/L (ref 40–115)
BUN: 12 mg/dL (ref 7–25)
Calcium: 10.1 mg/dL (ref 8.6–10.3)
Chloride: 102 mmol/L (ref 98–110)
Creat: 1.06 mg/dL (ref 0.60–1.35)
Globulin: 2.5 g/dL (ref 1.9–3.7)
Potassium: 4.7 mmol/L (ref 3.5–5.3)
Total Bilirubin: 0.5 mg/dL (ref 0.2–1.2)

## 2017-03-17 LAB — SEDIMENTATION RATE: Sed Rate: 2 mm/h (ref 0–15)

## 2017-03-17 LAB — COMPREHENSIVE METABOLIC PANEL
AG Ratio: 1.9 (calc) (ref 1.0–2.5)
ALT: 40 U/L (ref 9–46)
Albumin: 4.8 g/dL (ref 3.6–5.1)
CO2: 30 mmol/L (ref 20–32)
Glucose, Bld: 97 mg/dL (ref 65–99)
Sodium: 138 mmol/L (ref 135–146)
Total Protein: 7.3 g/dL (ref 6.1–8.1)

## 2017-03-17 LAB — HEMOGLOBIN A1C
Hgb A1c MFr Bld: 4.9 %{Hb} (ref <5.7)
Mean Plasma Glucose: 94 (calc)
eAG (mmol/L): 5.2 (calc)

## 2017-03-17 LAB — C-REACTIVE PROTEIN: CRP: 2.9 mg/L (ref <8.0)

## 2017-03-17 LAB — DIRECT LDL: Direct LDL: 216 mg/dL — ABNORMAL HIGH (ref <100)

## 2017-05-10 ENCOUNTER — Encounter: Payer: Self-pay | Admitting: Sports Medicine

## 2017-05-10 ENCOUNTER — Ambulatory Visit (INDEPENDENT_AMBULATORY_CARE_PROVIDER_SITE_OTHER): Payer: No Typology Code available for payment source | Admitting: Sports Medicine

## 2017-05-10 DIAGNOSIS — E785 Hyperlipidemia, unspecified: Secondary | ICD-10-CM

## 2017-05-10 DIAGNOSIS — Z0000 Encounter for general adult medical examination without abnormal findings: Secondary | ICD-10-CM | POA: Diagnosis not present

## 2017-05-10 DIAGNOSIS — M67911 Unspecified disorder of synovium and tendon, right shoulder: Secondary | ICD-10-CM | POA: Diagnosis not present

## 2017-05-10 LAB — LIPID PANEL W/REFLEX DIRECT LDL
Cholesterol: 314 mg/dL — ABNORMAL HIGH (ref <200)
HDL: 27 mg/dL — ABNORMAL LOW (ref >40)
LDL Cholesterol (Calc): 235 mg/dL — ABNORMAL HIGH
Non-HDL Cholesterol (Calc): 287 mg/dL (calc) — ABNORMAL HIGH (ref <130)
Total CHOL/HDL Ratio: 11.6 (calc) — ABNORMAL HIGH (ref <5.0)
Triglycerides: 291 mg/dL — ABNORMAL HIGH (ref <150)

## 2017-05-10 MED ORDER — ATORVASTATIN CALCIUM 40 MG PO TABS: 40.0000 mg | ORAL_TABLET | Freq: Every day | ORAL | 3 refills | Status: DC

## 2017-05-10 NOTE — Assessment & Plan Note (Signed)
Needs routine HIV screening.

## 2017-05-10 NOTE — Assessment & Plan Note (Signed)
Did well after an injection 5 months ago, now having a recurrence of pain. He is going to think about whether he would like to proceed with surgical intervention.

## 2017-05-10 NOTE — Progress Notes (Addendum)
Subjective:    CC: Follow-up  HPI: Pain after injection about 5 months ago, but not yet ready to consider further intervention.  Hyperlipidemia: Has been doing a low-cholesterol diet, 10 pounds down.  Ready to recheck.  I reviewed the past medical history, family history, social history, surgical history, and allergies today and no changes were needed.  Please see the problem list section below in epic for further details.  Past Medical History: Past Medical History:  Diagnosis Date  . Acute bilateral knee pain 05/06/2016  . Back injury   . GERD (gastroesophageal reflux disease)   . Hip fracture, right, sequela 05/14/2016  . History of hiatal hernia    Past Surgical History: Past Surgical History:  Procedure Laterality Date  . CHONDROPLASTY Left 11/07/2014   Procedure: CHONDROPLASTY;  Surgeon: Jodi GeraldsJohn Graves, MD;  Location: [facility];  Service: Orthopedics;  Laterality: Left;  . KNEE ARTHROSCOPY WITH LATERAL MENISECTOMY Left 11/07/2014   Procedure: Knee arthroscopy with lateral menisectomy, with lateral plica;  Surgeon: Jodi GeraldsJohn Graves, MD;  Location: [facility];  Service: Orthopedics;  Laterality: Left;  . KNEE SURGERY     Social History: Social History   Socioeconomic History  . Marital status: Single    Spouse name: None  . Number of children: None  . Years of education: None  . Highest education level: None  Social Needs  . Financial resource strain: None  . Food insecurity - worry: None  . Food insecurity - inability: None  . Transportation needs - medical: None  . Transportation needs - non-medical: None  Occupational History  . None  Tobacco Use  . Smoking status: Never Smoker  . Smokeless tobacco: Current User    Types: Chew  Substance and Sexual Activity  . Alcohol use: No    Comment: Quit 7 weeks ago  . Drug use: No  . Sexual activity: Yes    Partners: Female  Other Topics Concern  . None  Social History Narrative  . None    Family History: No family history on file. Allergies: No Known Allergies Medications: See med rec.  Review of Systems: No fevers, chills, night sweats, weight loss, chest pain, or shortness of breath.   Objective:    General: Well Developed, well nourished, and in no acute distress.  Neuro: Alert and oriented x3, extra-ocular muscles intact, sensation grossly intact.  HEENT: Normocephalic, atraumatic, pupils equal round reactive to light, neck supple, no masses, no lymphadenopathy, thyroid nonpalpable.  Skin: Warm and dry, no rashes. Cardiac: Regular rate and rhythm, no murmurs rubs or gallops, no lower extremity edema.  Respiratory: Clear to auscultation bilaterally. Not using accessory muscles, speaking in full sentences.  Impression and Recommendations:    Hyperlipidemia with target low density lipoprotein (LDL) cholesterol less than 100 mg/dL Rechecking lipids, previously elevated, he has lost 10 pounds, he is been doing a low-cholesterol diet. Goal LDL is less than 100.  The total cholesterol, and LDL is still tremendously elevated, this likely represents a familial hypercholesterolemia and we are going to need to use atorvastatin 40 mg daily, recheck in 2 months.  Annual physical exam Needs routine HIV screening.  Disorder of rotator cuff, right Did well after an injection 5 months ago, now having a recurrence of pain. He is going to think about whether he would like to proceed with surgical intervention. __________________________________________ Ihor Austinhomas J. Benjamin Stainhekkekandam, M.D., ABFM., CAQSM. Primary Care and Sports Medicine  [facility]  Adjunct Instructor of Family Medicine  [employer]

## 2017-05-10 NOTE — Addendum Note (Signed)
Addended by: Monica BectonHEKKEKANDAM, Mariadejesus Cade J on: 05/10/2017 05:07 PM   Modules accepted: Orders

## 2017-05-10 NOTE — Assessment & Plan Note (Addendum)
Rechecking lipids, previously elevated, he has lost 10 pounds, he is been doing a low-cholesterol diet. Goal LDL is less than 100.  The total cholesterol, and LDL is still tremendously elevated, this likely represents a familial hypercholesterolemia and we are going to need to use atorvastatin 40 mg daily, recheck in 2 months.

## 2017-05-19 ENCOUNTER — Telehealth: Payer: Self-pay

## 2017-05-19 NOTE — Telephone Encounter (Signed)
Pt left VM stating that since he's started the cholesterol medication he's been going to the bathroom a lot.

## 2017-05-19 NOTE — Telephone Encounter (Signed)
Probably incidental to something else, that's not a side effect of lipitor.  No change in plan.

## 2017-06-24 ENCOUNTER — Telehealth: Payer: Self-pay | Admitting: Sports Medicine

## 2017-06-24 DIAGNOSIS — E785 Hyperlipidemia, unspecified: Secondary | ICD-10-CM

## 2017-06-24 NOTE — Telephone Encounter (Signed)
Pt called and stated he had lvm for [facility] a week ago requesting to get labs ordered to check his cholesterol but has not heard back. He is wanting to know if he can get this done. Thanks

## 2017-06-24 NOTE — Telephone Encounter (Signed)
Left VM for Pt to see what labs he is wanting. Do not see where he is due for any.

## 2017-06-30 NOTE — Telephone Encounter (Signed)
Spoke with Pt, he will get labs checked next week. Order placed.

## 2017-06-30 NOTE — Addendum Note (Signed)
Addended by: Collie SiadICHARDSON, Liliana Brentlinger M on: 06/30/2017 10:47 AM   Modules accepted: Orders

## 2017-07-14 LAB — LIPID PANEL
Cholesterol: 158 mg/dL (ref <200)
HDL: 37 mg/dL — ABNORMAL LOW (ref >40)
LDL Cholesterol (Calc): 95 mg/dL
Non-HDL Cholesterol (Calc): 121 mg/dL (calc) (ref <130)
Total CHOL/HDL Ratio: 4.3 (calc) (ref <5.0)
Triglycerides: 157 mg/dL — ABNORMAL HIGH (ref <150)

## 2017-07-19 ENCOUNTER — Ambulatory Visit (INDEPENDENT_AMBULATORY_CARE_PROVIDER_SITE_OTHER): Payer: No Typology Code available for payment source | Admitting: Sports Medicine

## 2017-07-19 DIAGNOSIS — M67911 Unspecified disorder of synovium and tendon, right shoulder: Secondary | ICD-10-CM

## 2017-07-19 DIAGNOSIS — M170 Bilateral primary osteoarthritis of knee: Secondary | ICD-10-CM | POA: Diagnosis not present

## 2017-07-19 DIAGNOSIS — E785 Hyperlipidemia, unspecified: Secondary | ICD-10-CM | POA: Diagnosis not present

## 2017-07-19 MED ORDER — ATORVASTATIN CALCIUM 20 MG PO TABS: 20.0000 mg | ORAL_TABLET | Freq: Every day | ORAL | 3 refills | Status: DC

## 2017-07-19 MED ORDER — CELECOXIB 200 MG PO CAPS: ORAL_CAPSULE | ORAL | 2 refills | Status: DC

## 2017-07-19 MED ORDER — DICLOFENAC SODIUM 2 % TD SOLN: 2.0000 | Freq: Two times a day (BID) | TRANSDERMAL | 11 refills | Status: DC

## 2017-07-19 NOTE — Assessment & Plan Note (Signed)
Finish Orthovisc in August of last year. Adding topical Pennsaid (diclofenac 2% topical), continue with Celebrex.

## 2017-07-19 NOTE — Assessment & Plan Note (Signed)
Patient desires to drop down to a lower dose of atorvastatin. Decreasing to 20 mg.

## 2017-07-19 NOTE — Assessment & Plan Note (Signed)
Previous subacromial injection was approximately 8 months ago, repeat today.

## 2017-07-19 NOTE — Progress Notes (Signed)
Subjective:    CC: Right shoulder pain  HPI: Jerry Shannon is a pleasant 47 year old male with subacromial bursitis, I injected his right sub-acromial bursa 8 months ago, he did well until recently.  Now having a recurrence of pain, moderate, persistent, localized over the deltoid, worse with overhead activities.  Desires repeat interventional treatment today.  Hyperlipidemia: Finally controlled after years of trying to convince him to use a statin.  He is endorsing mild myalgias, headache.  Desires to go down to 20 mg of Lipitor.  I reviewed the past medical history, family history, social history, surgical history, and allergies today and no changes were needed.  Please see the problem list section below in epic for further details.  Past Medical History: Past Medical History:  Diagnosis Date  . Acute bilateral knee pain 05/06/2016  . Back injury   . GERD (gastroesophageal reflux disease)   . Hip fracture, right, sequela 05/14/2016  . History of hiatal hernia    Past Surgical History: Past Surgical History:  Procedure Laterality Date  . CHONDROPLASTY Left 11/07/2014   Procedure: CHONDROPLASTY;  Surgeon: Jodi Geralds, MD;  Location: [facility];  Service: Orthopedics;  Laterality: Left;  . KNEE ARTHROSCOPY WITH LATERAL MENISECTOMY Left 11/07/2014   Procedure: Knee arthroscopy with lateral menisectomy, with lateral plica;  Surgeon: Jodi Geralds, MD;  Location: [facility];  Service: Orthopedics;  Laterality: Left;  . KNEE SURGERY     Social History: Social History   Socioeconomic History  . Marital status: Single    Spouse name: Not on file  . Number of children: Not on file  . Years of education: Not on file  . Highest education level: Not on file  Occupational History  . Not on file  Social Needs  . Financial resource strain: Not on file  . Food insecurity:    Worry: Not on file    Inability: Not on file  . Transportation needs:    Medical: Not on  file    Non-medical: Not on file  Tobacco Use  . Smoking status: Never Smoker  . Smokeless tobacco: Current User    Types: Chew  Substance and Sexual Activity  . Alcohol use: No    Comment: Quit 7 weeks ago  . Drug use: No  . Sexual activity: Yes    Partners: Female  Lifestyle  . Physical activity:    Days per week: Not on file    Minutes per session: Not on file  . Stress: Not on file  Relationships  . Social connections:    Talks on phone: Not on file    Gets together: Not on file    Attends religious service: Not on file    Active member of club or organization: Not on file    Attends meetings of clubs or organizations: Not on file    Relationship status: Not on file  Other Topics Concern  . Not on file  Social History Narrative  . Not on file   Family History: No family history on file. Allergies: No Known Allergies Medications: See med rec.  Review of Systems: No fevers, chills, night sweats, weight loss, chest pain, or shortness of breath.   Objective:    General: Well Developed, well nourished, and in no acute distress.  Neuro: Alert and oriented x3, extra-ocular muscles intact, sensation grossly intact.  HEENT: Normocephalic, atraumatic, pupils equal round reactive to light, neck supple, no masses, no lymphadenopathy, thyroid nonpalpable.  Skin: Warm and dry, no  rashes. Cardiac: Regular rate and rhythm, no murmurs rubs or gallops, no lower extremity edema.  Respiratory: Clear to auscultation bilaterally. Not using accessory muscles, speaking in full sentences. Right shoulder: Inspection reveals no abnormalities, atrophy or asymmetry. Palpation is normal with no tenderness over AC joint or bicipital groove. ROM is full in all planes. Rotator cuff strength normal throughout. Positive Neer and Hawkin's tests, empty can. Speeds and Yergason's tests normal. No labral pathology noted with negative Obrien's, negative crank, negative clunk, and good  stability. Normal scapular function observed. No painful arc and no drop arm sign. No apprehension sign  Procedure: Real-time Ultrasound Guided Injection of right subacromial bursa Device: GE Logiq E  Verbal informed consent obtained.  Time-out conducted.  Noted no overlying erythema, induration, or other signs of local infection.  Skin prepped in a sterile fashion.  Local anesthesia: Topical Ethyl chloride.  With sterile technique and under real time ultrasound guidance: Noted intact supraspinatus, there did appear to be a bursal sided tear at the interval between the supraspinatus and infraspinatus, 1 cc kenalog 40, 1 cc lidocaine, 1 cc bupivacaine injected easily Completed without difficulty  Pain immediately resolved suggesting accurate placement of the medication.  Advised to call if fevers/chills, erythema, induration, drainage, or persistent bleeding.  Images permanently stored and available for review in the ultrasound unit.  Impression: Technically successful ultrasound guided injection.  Impression and Recommendations:    Disorder of rotator cuff, right Previous subacromial injection was approximately 8 months ago, repeat today.  Primary osteoarthritis of both knees Finish Orthovisc in August of last year. Adding topical Pennsaid (diclofenac 2% topical), continue with Celebrex.  Hyperlipidemia with target low density lipoprotein (LDL) cholesterol less than 100 mg/dL Patient desires to drop down to a lower dose of atorvastatin. Decreasing to 20 mg.  ___________________________________________ [name]. Benjamin Stain, M.D., ABFM., CAQSM. Primary Care and Sports Medicine [facility]  Adjunct Instructor of Family Medicine  [employer]

## 2017-07-20 ENCOUNTER — Ambulatory Visit: Payer: Self-pay | Admitting: Sports Medicine

## 2017-08-17 ENCOUNTER — Other Ambulatory Visit: Payer: Self-pay | Admitting: Sports Medicine

## 2017-08-17 DIAGNOSIS — L219 Seborrheic dermatitis, unspecified: Secondary | ICD-10-CM

## 2017-09-08 ENCOUNTER — Telehealth: Payer: Self-pay

## 2017-09-08 MED ORDER — TAZAROTENE 0.05 % EX CREA: TOPICAL_CREAM | Freq: Every day | CUTANEOUS | 0 refills | Status: DC

## 2017-09-08 NOTE — Telephone Encounter (Signed)
Pt advised.

## 2017-09-08 NOTE — Telephone Encounter (Signed)
No problem, prescription called in.

## 2017-09-08 NOTE — Telephone Encounter (Signed)
Pt called stating that his significant other has an RX for Tazorac and he has been using it to clear up the sun spots on his forehead and it is working really well.  Pt does not want to make an office visit just to get RX for this.Jerry Shannon. wanting to know if Dr T can write him an RX for the generic- Tazarotene.  Please advise.

## 2017-09-13 NOTE — Telephone Encounter (Signed)
Received a letter from [facility] that Tazorac cream was approved from 08/14/2017 through 09/12/2020. Pharmacy notified and form sent to scan. Patient has been notified and is willing to pay the co pay at the pharmacy.

## 2017-09-13 NOTE — Telephone Encounter (Signed)
Received a form from [facility] that Tozorac was approved from 08/14/2017 through 09/12/2020. Pharmacy notified. Patient is aware that he will have a co pay and he is willing to pay the co-pay for the medication. Forms sent to scan.

## 2017-11-10 ENCOUNTER — Encounter: Payer: Self-pay | Admitting: Sports Medicine

## 2017-11-10 ENCOUNTER — Ambulatory Visit (INDEPENDENT_AMBULATORY_CARE_PROVIDER_SITE_OTHER): Payer: No Typology Code available for payment source | Admitting: Sports Medicine

## 2017-11-10 DIAGNOSIS — E785 Hyperlipidemia, unspecified: Secondary | ICD-10-CM

## 2017-11-10 DIAGNOSIS — R631 Polydipsia: Secondary | ICD-10-CM | POA: Diagnosis not present

## 2017-11-10 NOTE — Assessment & Plan Note (Signed)
Doing well with atorvastatin 20, rechecking lipids today.

## 2017-11-10 NOTE — Assessment & Plan Note (Signed)
I think this is probably just his temperament, but we will check hemoglobin A1c, as well as urine and serum osmolality to evaluate for any evidence of diabetes insipidus.

## 2017-11-10 NOTE — Progress Notes (Signed)
Subjective:    CC: Follow-up  HPI: Hyperlipidemia: Well-controlled 3 months ago, would like to get a recheck.  Excessive thirst: Mentions head trauma years ago, tells me he drinks excessive amounts of water and soda, urine is clear, he is not having much nocturia, electrolytes have never been abnormal.  Blood sugars have been normal.  No headaches, visual changes.  I reviewed the past medical history, family history, social history, surgical history, and allergies today and no changes were needed.  Please see the problem list section below in epic for further details.  Past Medical History: Past Medical History:  Diagnosis Date  . Acute bilateral knee pain 05/06/2016  . Back injury   . GERD (gastroesophageal reflux disease)   . Hip fracture, right, sequela 05/14/2016  . History of hiatal hernia    Past Surgical History: Past Surgical History:  Procedure Laterality Date  . CHONDROPLASTY Left 11/07/2014   Procedure: CHONDROPLASTY;  Surgeon: Jodi Geralds, MD;  Location: [facility];  Service: Orthopedics;  Laterality: Left;  . KNEE ARTHROSCOPY WITH LATERAL MENISECTOMY Left 11/07/2014   Procedure: Knee arthroscopy with lateral menisectomy, with lateral plica;  Surgeon: Jodi Geralds, MD;  Location: [facility];  Service: Orthopedics;  Laterality: Left;  . KNEE SURGERY     Social History: Social History   Socioeconomic History  . Marital status: Single    Spouse name: Not on file  . Number of children: Not on file  . Years of education: Not on file  . Highest education level: Not on file  Occupational History  . Not on file  Social Needs  . Financial resource strain: Not on file  . Food insecurity:    Worry: Not on file    Inability: Not on file  . Transportation needs:    Medical: Not on file    Non-medical: Not on file  Tobacco Use  . Smoking status: Never Smoker  . Smokeless tobacco: Current User    Types: Chew  Substance and Sexual Activity    . Alcohol use: No    Comment: Quit 7 weeks ago  . Drug use: No  . Sexual activity: Yes    Partners: Female  Lifestyle  . Physical activity:    Days per week: Not on file    Minutes per session: Not on file  . Stress: Not on file  Relationships  . Social connections:    Talks on phone: Not on file    Gets together: Not on file    Attends religious service: Not on file    Active member of club or organization: Not on file    Attends meetings of clubs or organizations: Not on file    Relationship status: Not on file  Other Topics Concern  . Not on file  Social History Narrative  . Not on file   Family History: No family history on file. Allergies: No Known Allergies Medications: See med rec.  Review of Systems: No fevers, chills, night sweats, weight loss, chest pain, or shortness of breath.   Objective:    General: Well Developed, well nourished, and in no acute distress.  Neuro: Alert and oriented x3, extra-ocular muscles intact, sensation grossly intact.  HEENT: Normocephalic, atraumatic, pupils equal round reactive to light, neck supple, no masses, no lymphadenopathy, thyroid nonpalpable.  Skin: Warm and dry, no rashes. Cardiac: Regular rate and rhythm, no murmurs rubs or gallops, no lower extremity edema.  Respiratory: Clear to auscultation bilaterally. Not using accessory muscles,  speaking in full sentences.  Impression and Recommendations:    Hyperlipidemia with target low density lipoprotein (LDL) cholesterol less than 100 mg/dL Doing well with atorvastatin 20, rechecking lipids today.  Excessive thirst I think this is probably just his temperament, but we will check hemoglobin A1c, as well as urine and serum osmolality to evaluate for any evidence of diabetes insipidus.  I spent 25 minutes with this patient, greater than 50% was face-to-face time counseling regarding the above diagnoses  ___________________________________________ [name]. Benjamin Stain,  M.D., ABFM., CAQSM. Primary Care and Sports Medicine [facility]  Adjunct Instructor of Family Medicine  [employer]

## 2017-11-12 LAB — URINALYSIS, ROUTINE W REFLEX MICROSCOPIC
Bilirubin Urine: NEGATIVE
Glucose, UA: NEGATIVE
Hgb urine dipstick: NEGATIVE
Ketones, ur: NEGATIVE
Leukocytes, UA: NEGATIVE
Nitrite: NEGATIVE
Protein, ur: NEGATIVE
Specific Gravity, Urine: 1.017 (ref 1.001–1.03)
pH: 6 (ref 5.0–8.0)

## 2017-11-12 LAB — OSMOLALITY: Osmolality: 298 mosm/kg (ref 278–305)

## 2017-11-12 LAB — COMPREHENSIVE METABOLIC PANEL
AG Ratio: 2.3 (calc) (ref 1.0–2.5)
Albumin: 4.9 g/dL (ref 3.6–5.1)
Alkaline phosphatase (APISO): 70 U/L (ref 40–115)
BUN: 15 mg/dL (ref 7–25)
CO2: 27 mmol/L (ref 20–32)
Calcium: 10 mg/dL (ref 8.6–10.3)
Chloride: 103 mmol/L (ref 98–110)
Globulin: 2.1 g/dL (calc) (ref 1.9–3.7)
Potassium: 4.5 mmol/L (ref 3.5–5.3)
Sodium: 140 mmol/L (ref 135–146)
Total Bilirubin: 1.5 mg/dL — ABNORMAL HIGH (ref 0.2–1.2)
Total Protein: 7 g/dL (ref 6.1–8.1)

## 2017-11-12 LAB — COMPREHENSIVE METABOLIC PANEL WITH GFR
ALT: 40 U/L (ref 9–46)
AST: 29 U/L (ref 10–40)
Creat: 1.02 mg/dL (ref 0.60–1.35)
Glucose, Bld: 90 mg/dL (ref 65–139)

## 2017-11-12 LAB — LIPID PANEL W/REFLEX DIRECT LDL
Cholesterol: 195 mg/dL (ref <200)
HDL: 46 mg/dL (ref >40)
LDL Cholesterol (Calc): 117 mg/dL (calc) — ABNORMAL HIGH
Non-HDL Cholesterol (Calc): 149 mg/dL — ABNORMAL HIGH (ref <130)
Total CHOL/HDL Ratio: 4.2 (calc) (ref <5.0)
Triglycerides: 196 mg/dL — ABNORMAL HIGH (ref <150)

## 2017-11-15 ENCOUNTER — Telehealth: Payer: Self-pay

## 2017-11-15 NOTE — Telephone Encounter (Signed)
Jerry Shannon called for results to his most recent labs. Please advise.

## 2017-11-15 NOTE — Telephone Encounter (Signed)
Patient advised of results and recommendations.  

## 2017-11-15 NOTE — Telephone Encounter (Signed)
Still waiting on a couple of them, specifically the urine osmolality but the rest are normal, cholesterol looks good, triglycerides are only a bit elevated but this is expected and far better than the 500s where it has been.

## 2017-12-01 ENCOUNTER — Telehealth: Payer: Self-pay | Admitting: *Deleted

## 2017-12-01 DIAGNOSIS — E291 Testicular hypofunction: Secondary | ICD-10-CM

## 2017-12-01 DIAGNOSIS — N5201 Erectile dysfunction due to arterial insufficiency: Secondary | ICD-10-CM

## 2017-12-01 MED ORDER — TADALAFIL 5 MG PO TABS: 5.0000 mg | ORAL_TABLET | Freq: Every day | ORAL | 11 refills | Status: DC

## 2017-12-01 MED ORDER — TESTOSTERONE 40.5 MG/2.5GM (1.62%) TD GEL: 1.0000 "application " | Freq: Every day | TRANSDERMAL | 0 refills | Status: DC

## 2017-12-01 NOTE — Telephone Encounter (Signed)
Pt left vm asking for prescriptions for Cialis and the testosterone gel that he used to get.  His [facility] [address] even though [facility] is on his chart.

## 2017-12-01 NOTE — Telephone Encounter (Signed)
Done! ___________________________________________ Jerry Shannon J. Joell Usman, M.D., ABFM., CAQSM. Primary Care and Sports Medicine [facility]  Adjunct Instructor of Family Medicine  [employer]   

## 2017-12-01 NOTE — Assessment & Plan Note (Signed)
Restarting testosterone, need to recheck levels approximately 1 month after starts applications.

## 2017-12-03 ENCOUNTER — Other Ambulatory Visit: Payer: Self-pay | Admitting: Sports Medicine

## 2018-01-19 ENCOUNTER — Other Ambulatory Visit: Payer: Self-pay | Admitting: Sports Medicine

## 2018-02-01 ENCOUNTER — Telehealth: Payer: Self-pay | Admitting: *Deleted

## 2018-02-01 MED ORDER — DIAZEPAM 5 MG PO TABS: 5.0000 mg | ORAL_TABLET | Freq: Every day | ORAL | 0 refills | Status: DC | PRN

## 2018-02-01 MED ORDER — TRAMADOL HCL 50 MG PO TABS: 50.0000 mg | ORAL_TABLET | Freq: Three times a day (TID) | ORAL | 0 refills | Status: DC | PRN

## 2018-02-01 MED ORDER — TAZAROTENE 0.1 % EX CREA: TOPICAL_CREAM | Freq: Every day | CUTANEOUS | 11 refills | Status: DC

## 2018-02-01 NOTE — Telephone Encounter (Signed)
1.  Tazarotene dosage changed. 2.  Small quantity of Valium given for anxiolysis on the plane. 3.  Switching to tramadol for pain.

## 2018-02-01 NOTE — Telephone Encounter (Signed)
Pt left vm requesting multiple things.   1. He needs his Tazarotene changed from 0.05% to 0.10% (he said that's what he was on before) 2. He is going to be traveling by plane for work a lot in the next couple months and needs something to help him "relax" on the plane. 3. He needs something else for his leg pain.  Stated that NSAIDS upset his stomach.

## 2018-02-02 ENCOUNTER — Other Ambulatory Visit: Payer: Self-pay | Admitting: Sports Medicine

## 2018-02-02 DIAGNOSIS — M170 Bilateral primary osteoarthritis of knee: Secondary | ICD-10-CM

## 2018-02-02 MED ORDER — CELECOXIB 200 MG PO CAPS: ORAL_CAPSULE | ORAL | 2 refills | Status: DC

## 2018-02-02 MED ORDER — TRAMADOL HCL 50 MG PO TABS: 50.0000 mg | ORAL_TABLET | Freq: Three times a day (TID) | ORAL | 0 refills | Status: DC | PRN

## 2018-02-02 MED ORDER — TAZAROTENE 0.1 % EX CREA: TOPICAL_CREAM | Freq: Every day | CUTANEOUS | 11 refills | Status: DC

## 2018-02-02 NOTE — Telephone Encounter (Signed)
Pt called. Script was sent to wrong pharmacy([facility]) [facility] [address]  Thanks

## 2018-02-02 NOTE — Telephone Encounter (Signed)
Left message for patient to call back about medication changes below per PCP.

## 2018-02-21 ENCOUNTER — Other Ambulatory Visit: Payer: Self-pay | Admitting: Sports Medicine

## 2018-03-15 ENCOUNTER — Ambulatory Visit (INDEPENDENT_AMBULATORY_CARE_PROVIDER_SITE_OTHER): Payer: No Typology Code available for payment source | Admitting: Sports Medicine

## 2018-03-15 ENCOUNTER — Encounter: Payer: Self-pay | Admitting: Sports Medicine

## 2018-03-15 DIAGNOSIS — E785 Hyperlipidemia, unspecified: Secondary | ICD-10-CM

## 2018-03-15 DIAGNOSIS — M5416 Radiculopathy, lumbar region: Secondary | ICD-10-CM | POA: Diagnosis not present

## 2018-03-15 DIAGNOSIS — M7701 Medial epicondylitis, right elbow: Secondary | ICD-10-CM

## 2018-03-15 DIAGNOSIS — M7702 Medial epicondylitis, left elbow: Secondary | ICD-10-CM | POA: Diagnosis not present

## 2018-03-15 MED ORDER — PITAVASTATIN CALCIUM 4 MG PO TABS: 1.0000 | ORAL_TABLET | Freq: Every day | ORAL | 3 refills | Status: DC

## 2018-03-15 MED ORDER — GABAPENTIN 300 MG PO CAPS: ORAL_CAPSULE | ORAL | 3 refills | Status: DC

## 2018-03-15 MED ORDER — TRAMADOL HCL 50 MG PO TABS: 50.0000 mg | ORAL_TABLET | Freq: Two times a day (BID) | ORAL | 3 refills | Status: DC | PRN

## 2018-03-15 NOTE — Assessment & Plan Note (Signed)
Conservative treatment start out with.

## 2018-03-15 NOTE — Assessment & Plan Note (Signed)
Myalgias with simvastatin and atorvastatin. Switching to Livalo.

## 2018-03-15 NOTE — Assessment & Plan Note (Signed)
No improvement with multiple injections, no surgical option. He does well with tramadol twice a day and gabapentin.

## 2018-03-15 NOTE — Progress Notes (Signed)
Subjective:    CC: Elbow pain  HPI: This is a pleasant 48 year old male, for the past few weeks has had increasing pain all over his body, but mostly at the medial epicondyles right worse than left.  He felt a pop while lifting weights, followed by some bruising and swelling as well as weakness.  Lumbar radiculitis: Persistent pain, inoperable, epidural did not help.  He would like some tramadol and gabapentin which has worked in the past to control his pain.  Hyperlipidemia: Myalgias on Lipitor.  I reviewed the past medical history, family history, social history, surgical history, and allergies today and no changes were needed.  Please see the problem list section below in epic for further details.  Past Medical History: Past Medical History:  Diagnosis Date  . Acute bilateral knee pain 05/06/2016  . Back injury   . GERD (gastroesophageal reflux disease)   . Hip fracture, right, sequela 05/14/2016  . History of hiatal hernia    Past Surgical History: Past Surgical History:  Procedure Laterality Date  . CHONDROPLASTY Left 11/07/2014   Procedure: CHONDROPLASTY;  Surgeon: Jodi Geralds, MD;  Location: [facility];  Service: Orthopedics;  Laterality: Left;  . KNEE ARTHROSCOPY WITH LATERAL MENISECTOMY Left 11/07/2014   Procedure: Knee arthroscopy with lateral menisectomy, with lateral plica;  Surgeon: Jodi Geralds, MD;  [facility];  Service: Orthopedics;  Laterality: Left;  . KNEE SURGERY     Social History: Social History   Socioeconomic History  . Marital status: Single    Spouse name: Not on file  . Number of children: Not on file  . Years of education: Not on file  . Highest education level: Not on file  Occupational History  . Not on file  Social Needs  . Financial resource strain: Not on file  . Food insecurity:    Worry: Not on file    Inability: Not on file  . Transportation needs:    Medical: Not on file    Non-medical: Not on  file  Tobacco Use  . Smoking status: Never Smoker  . Smokeless tobacco: Current User    Types: Chew  Substance and Sexual Activity  . Alcohol use: No    Comment: Quit 7 weeks ago  . Drug use: No  . Sexual activity: Yes    Partners: Female  Lifestyle  . Physical activity:    Days per week: Not on file    Minutes per session: Not on file  . Stress: Not on file  Relationships  . Social connections:    Talks on phone: Not on file    Gets together: Not on file    Attends religious service: Not on file    Active member of club or organization: Not on file    Attends meetings of clubs or organizations: Not on file    Relationship status: Not on file  Other Topics Concern  . Not on file  Social History Narrative  . Not on file   Family History: No family history on file. Allergies: No Known Allergies Medications: See med rec.  Review of Systems: No fevers, chills, night sweats, weight loss, chest pain, or shortness of breath.   Objective:    General: Well Developed, well nourished, and in no acute distress.  Neuro: Alert and oriented x3, extra-ocular muscles intact, sensation grossly intact.  HEENT: Normocephalic, atraumatic, pupils equal round reactive to light, neck supple, no masses, no lymphadenopathy, thyroid nonpalpable.  Skin: Warm and dry,  no rashes. Cardiac: Regular rate and rhythm, no murmurs rubs or gallops, no lower extremity edema.  Respiratory: Clear to auscultation bilaterally. Not using accessory muscles, speaking in full sentences. Right elbow: Unremarkable to inspection. Range of motion full pronation, supination, flexion, extension. Tenderness at the medial epicondyle. Stable to varus, valgus stress. Negative moving valgus stress test. No discrete areas of tenderness to palpation. Ulnar nerve does not sublux. Negative cubital tunnel Tinel's.  Impression and Recommendations:    Medial epicondylitis of both elbows Conservative treatment start out  with.   Hyperlipidemia with target low density lipoprotein (LDL) cholesterol less than 100 mg/dL Myalgias with simvastatin and atorvastatin. Switching to Livalo.  Left lumbar radiculopathy No improvement with multiple injections, no surgical option. He does well with tramadol twice a day and gabapentin.  ___________________________________________ [name]. Benjamin Stain, M.D., ABFM., CAQSM. Primary Care and Sports Medicine [facility]  Adjunct Professor of Family Medicine  [employer]

## 2018-03-17 ENCOUNTER — Other Ambulatory Visit: Payer: Self-pay | Admitting: Sports Medicine

## 2018-03-17 ENCOUNTER — Telehealth: Payer: Self-pay | Admitting: *Deleted

## 2018-03-17 NOTE — Telephone Encounter (Signed)
Pt left vm stating that even with the coupon card the Livalo is going to be over $200/month so he wanted to know if you could send him in something that would be cheaper.  In the meantime, he's going to cut his Atorvastatin down to 20mg  from 40mg  due to myalgias.

## 2018-03-17 NOTE — Telephone Encounter (Signed)
If he can tolerate atorvastatin at 20 mg, even every other day then that is better than nothing.

## 2018-03-18 NOTE — Telephone Encounter (Signed)
Pt advised and will try that.

## 2018-03-25 ENCOUNTER — Other Ambulatory Visit: Payer: Self-pay | Admitting: Sports Medicine

## 2018-03-29 ENCOUNTER — Telehealth: Payer: Self-pay | Admitting: Sports Medicine

## 2018-03-29 NOTE — Telephone Encounter (Signed)
Patient had left a message that his insurance was requiring a PA and I went on Covermymeds and per insurance it was too early to fill. [facility] to [facility] and see if [facility] could do an override and per [place] he advised me that he would do an override and patient can pick up the full am of medication on 03/30/2018. I called patient to let he could pick up the full prescription tomorrow and he was appreciative.

## 2018-04-18 ENCOUNTER — Ambulatory Visit (INDEPENDENT_AMBULATORY_CARE_PROVIDER_SITE_OTHER): Payer: No Typology Code available for payment source | Admitting: Sports Medicine

## 2018-04-18 ENCOUNTER — Encounter: Payer: Self-pay | Admitting: Sports Medicine

## 2018-04-18 DIAGNOSIS — L719 Rosacea, unspecified: Secondary | ICD-10-CM

## 2018-04-18 DIAGNOSIS — L821 Other seborrheic keratosis: Secondary | ICD-10-CM | POA: Diagnosis not present

## 2018-04-18 DIAGNOSIS — E785 Hyperlipidemia, unspecified: Secondary | ICD-10-CM | POA: Diagnosis not present

## 2018-04-18 DIAGNOSIS — Z9889 Other specified postprocedural states: Secondary | ICD-10-CM | POA: Diagnosis not present

## 2018-04-18 DIAGNOSIS — Z96641 Presence of right artificial hip joint: Secondary | ICD-10-CM

## 2018-04-18 MED ORDER — METRONIDAZOLE 0.75 % EX GEL: 1.0000 "application " | Freq: Two times a day (BID) | CUTANEOUS | 3 refills | Status: DC

## 2018-04-18 NOTE — Assessment & Plan Note (Signed)
Repeat x-rays to ensure all is well.

## 2018-04-18 NOTE — Assessment & Plan Note (Addendum)
Rechecking lipids, he tells me he self decreased his own Lipitor dose.  Switching to Crestor, declines Repatha. Recheck fasting lipids in 2 months. No appointment needed for this.

## 2018-04-18 NOTE — Assessment & Plan Note (Signed)
Cryotherapy to seborrheic keratoses, one on the left abdomen, one on the left forehead. Return in 2 weeks to recheck and retreat if needed.

## 2018-04-18 NOTE — Progress Notes (Addendum)
Subjective:    CC: Follow-up  HPI: Skin lesion: Would like this treated.  History of left hip arthroplasty: Due for updated x-rays.  Hyperlipidemia: Self decreased his atorvastatin dose, desires to have lipids rechecked today.  Skin lesion on nose: Present for years, asymptomatic.  I reviewed the past medical history, family history, social history, surgical history, and allergies today and no changes were needed.  Please see the problem list section below in epic for further details.  Past Medical History: Past Medical History:  Diagnosis Date  . Acute bilateral knee pain 05/06/2016  . Back injury   . GERD (gastroesophageal reflux disease)   . Hip fracture, right, sequela 05/14/2016  . History of hiatal hernia    Past Surgical History: Past Surgical History:  Procedure Laterality Date  . CHONDROPLASTY Left 11/07/2014   Procedure: CHONDROPLASTY;  Surgeon: Jodi GeraldsJohn Graves, MD;  Location: [facility];  Service: Orthopedics;  Laterality: Left;  . KNEE ARTHROSCOPY WITH LATERAL MENISECTOMY Left 11/07/2014   Procedure: Knee arthroscopy with lateral menisectomy, with lateral plica;  Surgeon: Jodi GeraldsJohn Graves, MD;  Location: [facility];  Service: Orthopedics;  Laterality: Left;  . KNEE SURGERY     Social History: Social History   Socioeconomic History  . Marital status: Single    Spouse name: Not on file  . Number of children: Not on file  . Years of education: Not on file  . Highest education level: Not on file  Occupational History  . Not on file  Social Needs  . Financial resource strain: Not on file  . Food insecurity:    Worry: Not on file    Inability: Not on file  . Transportation needs:    Medical: Not on file    Non-medical: Not on file  Tobacco Use  . Smoking status: Never Smoker  . Smokeless tobacco: Current User    Types: Chew  Substance and Sexual Activity  . Alcohol use: No    Comment: Quit 7 weeks ago  . Drug use: No  . Sexual  activity: Yes    Partners: Female  Lifestyle  . Physical activity:    Days per week: Not on file    Minutes per session: Not on file  . Stress: Not on file  Relationships  . Social connections:    Talks on phone: Not on file    Gets together: Not on file    Attends religious service: Not on file    Active member of club or organization: Not on file    Attends meetings of clubs or organizations: Not on file    Relationship status: Not on file  Other Topics Concern  . Not on file  Social History Narrative  . Not on file   Family History: No family history on file. Allergies: No Known Allergies Medications: See med rec.  Review of Systems: No fevers, chills, night sweats, weight loss, chest pain, or shortness of breath.   Objective:    General: Well Developed, well nourished, and in no acute distress.  Neuro: Alert and oriented x3, extra-ocular muscles intact, sensation grossly intact.  HEENT: Normocephalic, atraumatic, pupils equal round reactive to light, neck supple, no masses, no lymphadenopathy, thyroid nonpalpable.  Skin: Warm and dry, erythro-telangiectatic rosacea over the nose.  Seborrheic keratosis on the left lower abdomen as well as the left forehead Cardiac: Regular rate and rhythm, no murmurs rubs or gallops, no lower extremity edema.  Respiratory: Clear to auscultation bilaterally. Not using accessory muscles,  speaking in full sentences.  Procedure:  Cryodestruction of left lower abdominal and left forehead seborrheic keratoses Consent obtained and verified. Time-out conducted. Noted no overlying erythema, induration, or other signs of local infection. Completed without difficulty using Cryo-Gun. Advised to call if fevers/chills, erythema, induration, drainage, or persistent bleeding.  Impression and Recommendations:    Seborrheic keratosis Cryotherapy to seborrheic keratoses, one on the left abdomen, one on the left forehead. Return in 2 weeks to recheck  and retreat if needed.  History of arthroplasty of right hip Repeat x-rays to ensure all is well.  Hyperlipidemia with target low density lipoprotein (LDL) cholesterol less than 100 mg/dL Rechecking lipids, he tells me he self decreased his own Lipitor dose.  Switching to Crestor, declines Repatha. Recheck fasting lipids in 2 months. No appointment needed for this.  Erythrotelangiectatic rosacea Erythrotelangiectatic rosacea. Adding topical metronidazole. We will switch to rosacea if no improvement over a month. We will try topical electrosurgery if still no improvement. ___________________________________________ Jerry Shannon, M.D., ABFM., CAQSM. Primary Care and Sports Medicine [facility]  Adjunct Professor of Family Medicine  [employer]

## 2018-04-18 NOTE — Patient Instructions (Signed)
Erythrotelangiectatic rosacea.

## 2018-04-18 NOTE — Assessment & Plan Note (Signed)
Erythrotelangiectatic rosacea. Adding topical metronidazole. We will switch to rosacea if no improvement over a month. We will try topical electrosurgery if still no improvement.

## 2018-04-19 LAB — LIPID PANEL W/REFLEX DIRECT LDL
Cholesterol: 295 mg/dL — ABNORMAL HIGH (ref <200)
HDL: 11 mg/dL — ABNORMAL LOW (ref >=40)
LDL Cholesterol (Calc): 232 mg/dL (calc) — ABNORMAL HIGH
Non-HDL Cholesterol (Calc): 284 mg/dL (calc) — ABNORMAL HIGH (ref <130)
Total CHOL/HDL Ratio: 26.8 (calc) — ABNORMAL HIGH (ref <5.0)
Triglycerides: 309 mg/dL — ABNORMAL HIGH (ref <150)

## 2018-04-19 LAB — COMPREHENSIVE METABOLIC PANEL
AG Ratio: 1.8 (calc) (ref 1.0–2.5)
ALT: 50 U/L — ABNORMAL HIGH (ref 9–46)
AST: 36 U/L (ref 10–40)
Albumin: 4.6 g/dL (ref 3.6–5.1)
BUN: 10 mg/dL (ref 7–25)
CO2: 25 mmol/L (ref 20–32)
Calcium: 9.6 mg/dL (ref 8.6–10.3)
Chloride: 98 mmol/L (ref 98–110)
Globulin: 2.5 g/dL (calc) (ref 1.9–3.7)
Glucose, Bld: 80 mg/dL (ref 65–99)
Potassium: 4.2 mmol/L (ref 3.5–5.3)
Sodium: 135 mmol/L (ref 135–146)
Total Bilirubin: 0.5 mg/dL (ref 0.2–1.2)
Total Protein: 7.1 g/dL (ref 6.1–8.1)

## 2018-04-19 LAB — COMPREHENSIVE METABOLIC PANEL WITH GFR
Alkaline phosphatase (APISO): 59 U/L (ref 36–130)
Creat: 0.97 mg/dL (ref 0.60–1.35)

## 2018-04-19 MED ORDER — ROSUVASTATIN CALCIUM 40 MG PO TABS: 40.0000 mg | ORAL_TABLET | Freq: Every day | ORAL | 3 refills | Status: DC

## 2018-04-19 NOTE — Addendum Note (Signed)
Addended by: Monica Becton on: 04/19/2018 01:29 PM   Modules accepted: Orders

## 2018-04-21 ENCOUNTER — Other Ambulatory Visit: Payer: Self-pay | Admitting: Sports Medicine

## 2018-04-22 ENCOUNTER — Encounter: Payer: Self-pay | Admitting: Sports Medicine

## 2018-04-22 ENCOUNTER — Telehealth: Payer: Self-pay | Admitting: Sports Medicine

## 2018-04-22 NOTE — Telephone Encounter (Signed)
Pt called to let provider know,that after a lot of research he cannot find anyone in his family history to have heart disease. He stated he knew his cholesterol was elevated.

## 2018-05-02 ENCOUNTER — Ambulatory Visit (INDEPENDENT_AMBULATORY_CARE_PROVIDER_SITE_OTHER): Payer: No Typology Code available for payment source | Admitting: Sports Medicine

## 2018-05-02 ENCOUNTER — Encounter: Payer: Self-pay | Admitting: Sports Medicine

## 2018-05-02 DIAGNOSIS — L821 Other seborrheic keratosis: Secondary | ICD-10-CM

## 2018-05-02 DIAGNOSIS — L719 Rosacea, unspecified: Secondary | ICD-10-CM

## 2018-05-02 MED ORDER — AZELAIC ACID 15 % EX GEL: 1.0000 "application " | Freq: Two times a day (BID) | CUTANEOUS | 11 refills | Status: DC

## 2018-05-02 NOTE — Progress Notes (Signed)
Subjective:    CC: Follow-up  HPI: Hyperlipidemia: Has been very resistant to statins in the past, he has done well on Crestor.  He does endorse some pain in his back when bench pressing, I do think this is more related to the orthopedic stresses placed on his chest and spine rather than diffuse myalgias from the statin.  He will continue to Crestor, did not tolerate Lipitor.  We will try Livalo but it was too expensive.  Rosacea: Improved considerably with topical metronidazole, still has some telangiectasia on his nose, agreeable to switch to the next day.  Seborrheic keratoses: Left side forehead is resolved, still has some persistence of the SK on his left abdomen.  I reviewed the past medical history, family history, social history, surgical history, and allergies today and no changes were needed.  Please see the problem list section below in epic for further details.  Past Medical History: Past Medical History:  Diagnosis Date  . Acute bilateral knee pain 05/06/2016  . Back injury   . GERD (gastroesophageal reflux disease)   . Hip fracture, right, sequela 05/14/2016  . History of hiatal hernia    Past Surgical History: Past Surgical History:  Procedure Laterality Date  . CHONDROPLASTY Left 11/07/2014   Procedure: CHONDROPLASTY;  Surgeon: Jodi Geralds, MD;  Location: [facility];  Service: Orthopedics;  Laterality: Left;  . KNEE ARTHROSCOPY WITH LATERAL MENISECTOMY Left 11/07/2014   Procedure: Knee arthroscopy with lateral menisectomy, with lateral plica;  Surgeon: Jodi Geralds, MD;  Location: [facility];  Service: Orthopedics;  Laterality: Left;  . KNEE SURGERY     Social History: Social History   Socioeconomic History  . Marital status: Single    Spouse name: Not on file  . Number of children: Not on file  . Years of education: Not on file  . Highest education level: Not on file  Occupational History  . Not on file  Social Needs  .  Financial resource strain: Not on file  . Food insecurity:    Worry: Not on file    Inability: Not on file  . Transportation needs:    Medical: Not on file    Non-medical: Not on file  Tobacco Use  . Smoking status: Never Smoker  . Smokeless tobacco: Current User    Types: Chew  Substance and Sexual Activity  . Alcohol use: No    Comment: Quit 7 weeks ago  . Drug use: No  . Sexual activity: Yes    Partners: Female  Lifestyle  . Physical activity:    Days per week: Not on file    Minutes per session: Not on file  . Stress: Not on file  Relationships  . Social connections:    Talks on phone: Not on file    Gets together: Not on file    Attends religious service: Not on file    Active member of club or organization: Not on file    Attends meetings of clubs or organizations: Not on file    Relationship status: Not on file  Other Topics Concern  . Not on file  Social History Narrative  . Not on file   Family History: No family history on file. Allergies: No Known Allergies Medications: See med rec.  Review of Systems: No fevers, chills, night sweats, weight loss, chest pain, or shortness of breath.   Objective:    General: Well Developed, well nourished, and in no acute distress.  Neuro: Alert and  oriented x3, extra-ocular muscles intact, sensation grossly intact.  HEENT: Normocephalic, atraumatic, pupils equal round reactive to light, neck supple, no masses, no lymphadenopathy, thyroid nonpalpable.  Skin: Warm and dry, no rashes. Cardiac: Regular rate and rhythm, no murmurs rubs or gallops, no lower extremity edema.  Respiratory: Clear to auscultation bilaterally. Not using accessory muscles, speaking in full sentences.  Procedure:  Cryodestruction of left lower abdominal seborrheic keratosis Consent obtained and verified. Time-out conducted. Noted no overlying erythema, induration, or other signs of local infection. Completed without difficulty using  Cryo-Gun. Advised to call if fevers/chills, erythema, induration, drainage, or persistent bleeding.  Impression and Recommendations:    Erythrotelangiectatic rosacea Improved considerably with topical metronidazole. Persistent telangiectasias. Switching to azelaic acid.  Seborrheic keratosis SK on the left forehead has disappeared. Repeat cryotherapy on the 1 of the abdomen. ___________________________________________ [name]. Benjamin Stain, M.D., ABFM., CAQSM. Primary Care and Sports Medicine [facility]  Adjunct Professor of Family Medicine  [employer]

## 2018-05-02 NOTE — Assessment & Plan Note (Signed)
SK on the left forehead has disappeared. Repeat cryotherapy on the 1 of the abdomen.

## 2018-05-02 NOTE — Assessment & Plan Note (Signed)
Improved considerably with topical metronidazole. Persistent telangiectasias. Switching to azelaic acid.

## 2018-05-11 ENCOUNTER — Encounter: Payer: Self-pay | Admitting: Sports Medicine

## 2018-05-12 MED ORDER — AMBULATORY NON FORMULARY MEDICATION: 5 refills | Status: DC

## 2018-05-17 MED ORDER — AMBULATORY NON FORMULARY MEDICATION: 5 refills | Status: DC

## 2018-05-20 ENCOUNTER — Other Ambulatory Visit: Payer: Self-pay | Admitting: Sports Medicine

## 2018-05-20 MED ORDER — TESTOSTERONE 20.25 MG/ACT (1.62%) TD GEL: TRANSDERMAL | 0 refills | Status: DC

## 2018-05-20 NOTE — Telephone Encounter (Signed)
Pt questions why last Rx refill was denied. Due for refill. Routing.

## 2018-05-20 NOTE — Addendum Note (Signed)
Addended by: Mallie Snooks R on: 05/20/2018 04:04 PM   Modules accepted: Orders

## 2018-05-23 NOTE — Telephone Encounter (Signed)
Pt advised Rx was sent. 

## 2018-05-24 IMAGING — MR MR KNEE*L* W/O CM
6 series · 39 of 40 positions shown · non-contrast
Comparison: 10/29/2014

CLINICAL DATA: Burning, popping, pain, weakness

EXAM:
MRI OF THE LEFT KNEE WITHOUT CONTRAST
TECHNIQUE: Multiplanar, multisequence MR imaging of the knee was performed. No
intravenous contrast was administered.

[Series 3: PD fat-sat · axial · 3.0mm · 0.33mm/px · z∈[-56,+56]mm · 8 of 35 slices shown (1 of 4)]
[im 1/35]
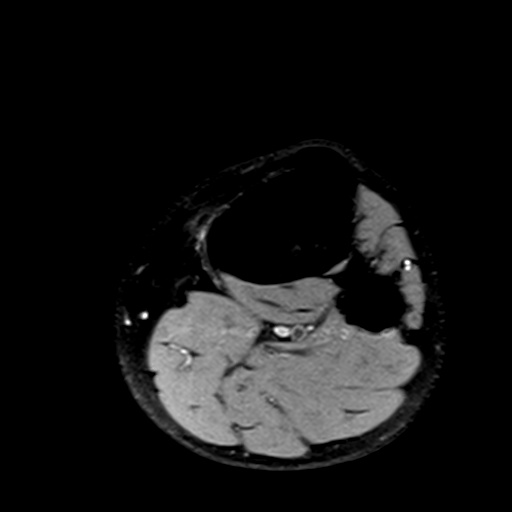
[im 5/35]
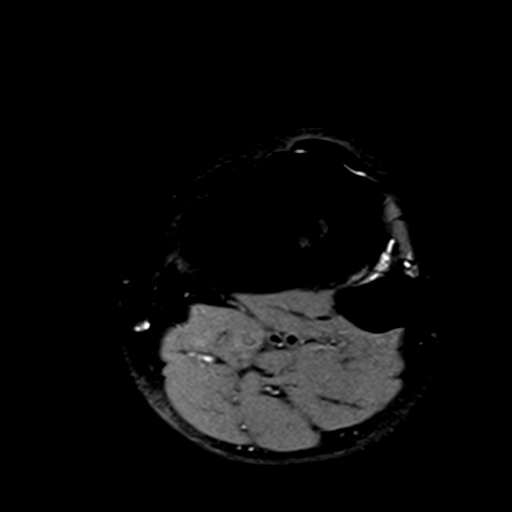
[im 10/35]
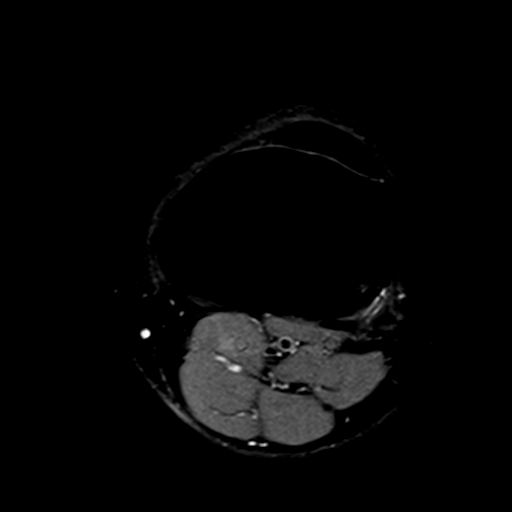
[im 15/35]
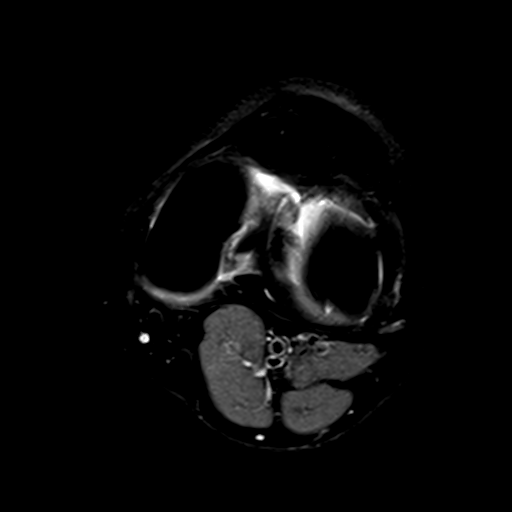
[im 20/35]
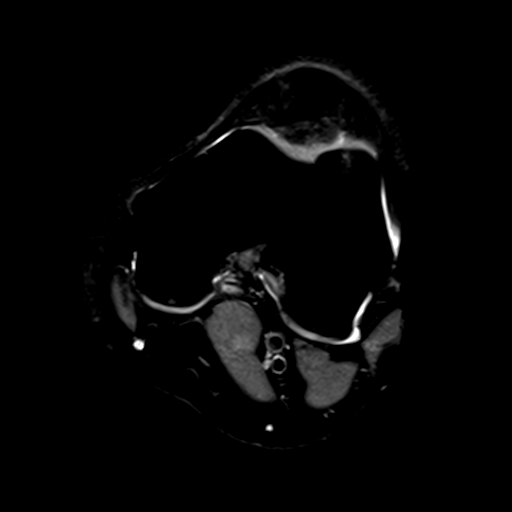
[im 25/35]
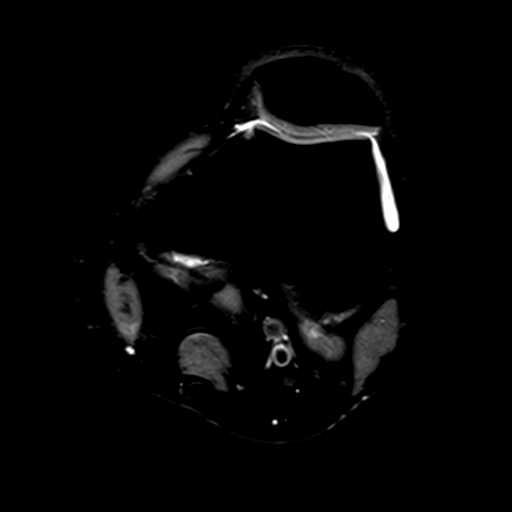
[im 30/35]
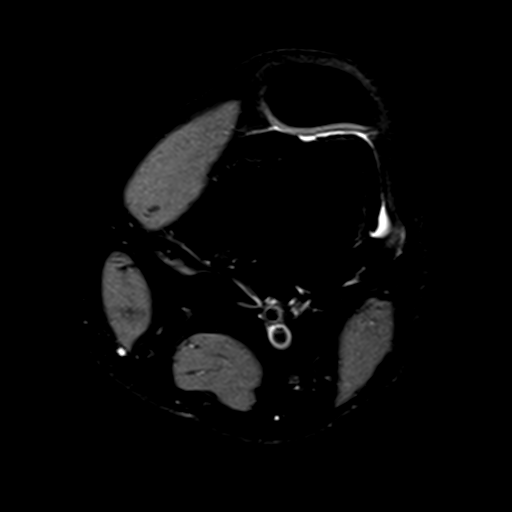
[im 35/35]
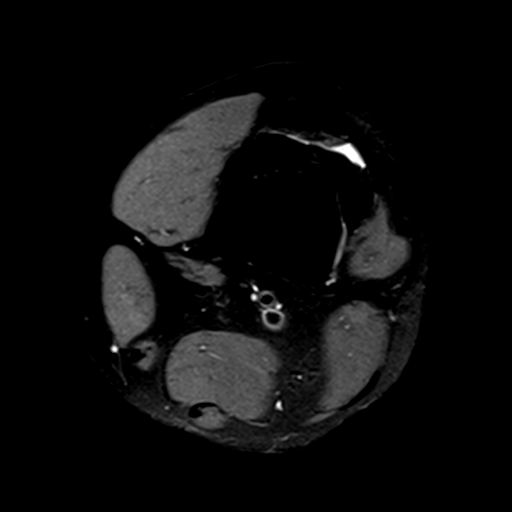

[Series 4: T1 · coronal · 3.0mm · 0.50mm/px · 6 of 33 slices shown]
[im 1/33]
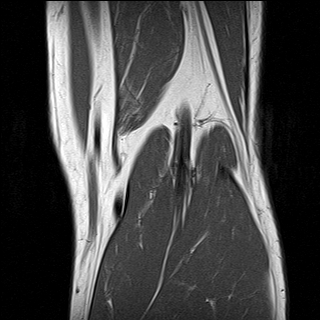
[im 6/33]
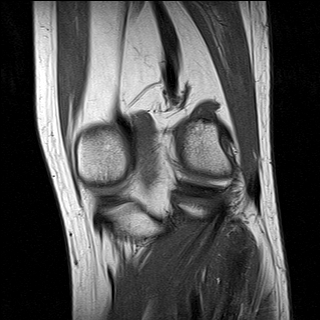
[im 11/33]
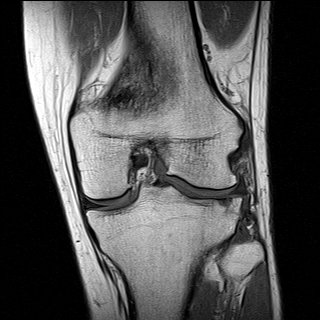
[im 17/33]
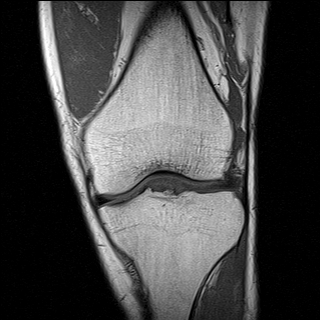
[im 22/33]
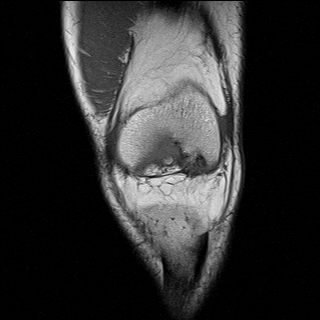
[im 27/33]
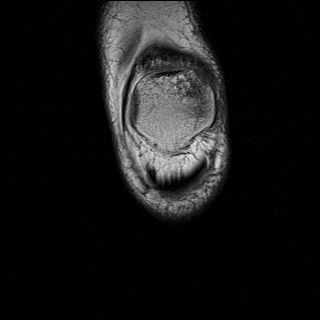

[Series 5: T2 fat-sat · coronal · 3.0mm · 0.50mm/px · 7 of 33 slices shown]
[im 1/33]
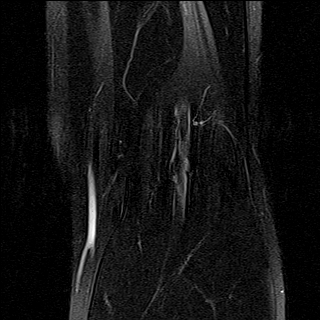
[im 6/33]
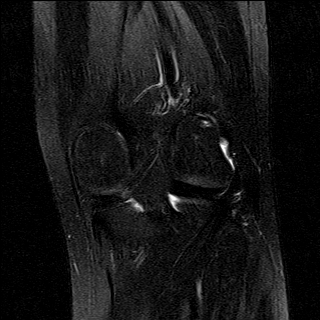
[im 11/33]
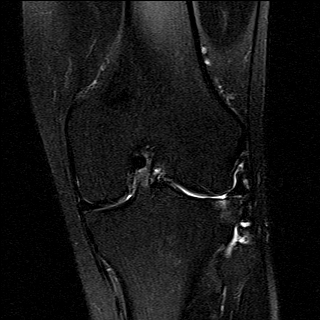
[im 17/33]
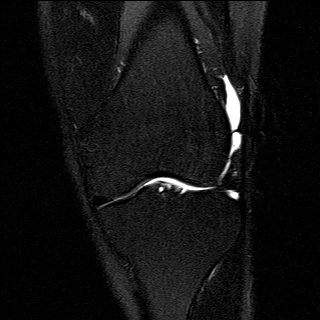
[im 22/33]
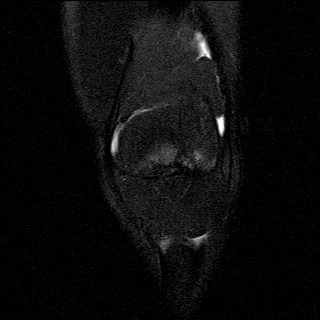
[im 27/33]
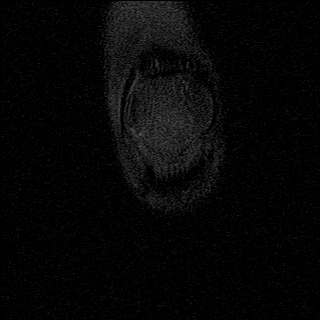
[im 33/33]
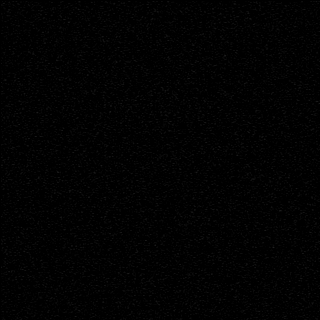

[Series 6: PD fat-sat · coronal · 3.0mm · 0.62mm/px · 7 of 33 slices shown (2 of 4)]
[im 1/33]
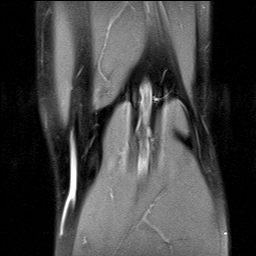
[im 6/33]
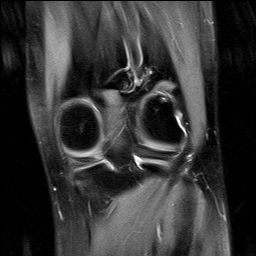
[im 11/33]
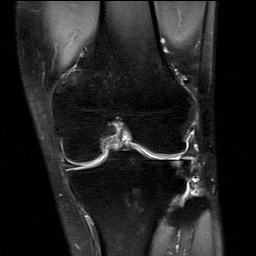
[im 17/33]
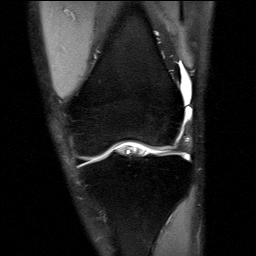
[im 22/33]
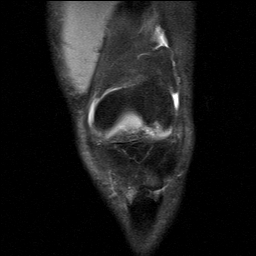
[im 27/33]
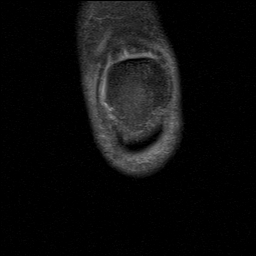
[im 33/33]
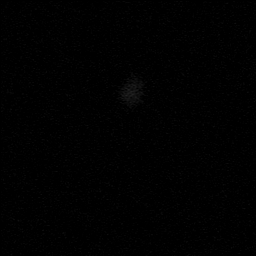

[Series 7: PD fat-sat · sagittal · 3.0mm · 0.62mm/px · 8 of 35 slices shown (3 of 4)]
[im 1/35]
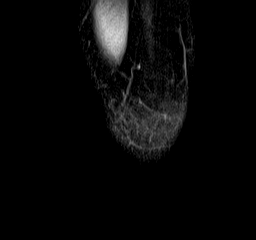
[im 5/35]
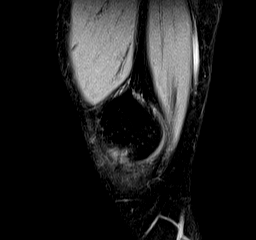
[im 10/35]
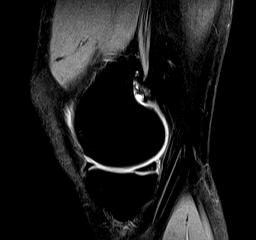
[im 15/35]
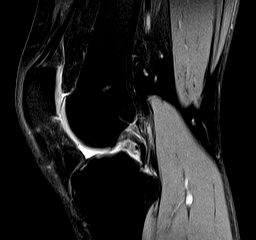
[im 20/35]
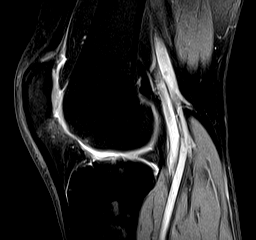
[im 25/35]
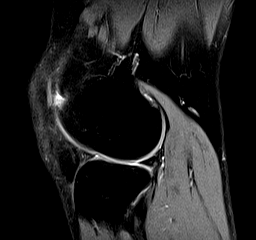
[im 30/35]
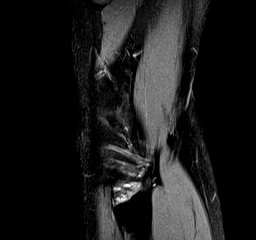
[im 35/35]
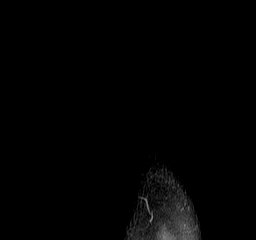

[Series 8: PD fat-sat · oblique · 2.0mm · 0.62mm/px · 3 of 13 slices shown (4 of 4)]
[im 1/13]
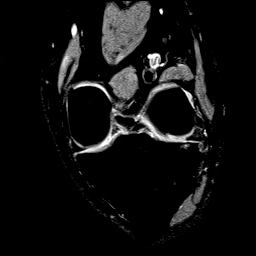
[im 7/13]
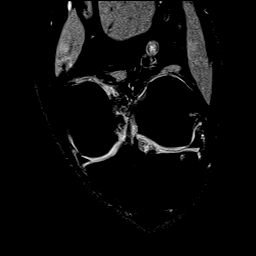
[im 13/13]
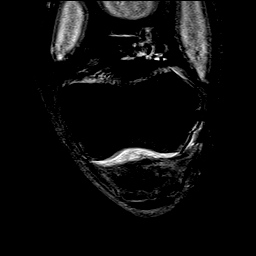

[39 of 40 positions shown; findings below may reference images not displayed]

FINDINGS: MENISCI

Medial meniscus: Mild increased signal in the posterior horn of the
medial meniscus consistent with mild degeneration. No discrete tear.

Lateral meniscus: Degenerative tear and/or meniscectomy of the
anterior horn of the lateral meniscus.

LIGAMENTS

Cruciates:  Intact ACL and PCL.

Collaterals: Medial collateral ligament is intact. Lateral
collateral ligament complex is intact.

CARTILAGE

Patellofemoral: Full-thickness cartilage loss along the periphery of
the lateral trochlea with mild subchondral reactive marrow changes.

Medial: Partial-thickness cartilage loss of the medial femoral
condyle and medial tibial plateau.

Lateral: High-grade partial-thickness cartilage loss with areas of
full-thickness cartilage loss of the lateral tibial plateau with
subchondral reactive marrow changes and to lesser extent lateral
femoral condyles.

Joint: No significant joint effusion. Mild edema in superolateral
Hoffa's fat. No plical thickening.

Popliteal Fossa:  No Baker cyst.  Intact popliteus tendon.

Extensor Mechanism: Intact quadriceps tendon and patellar tendon.
Intact medial and lateral patellar retinaculum. Intact MPFL.

Bones: No other marrow signal abnormality. No fracture or
dislocation.

Other: No fluid collection or hematoma.
IMPRESSION: 1. Degenerative tear and/or meniscectomy of the anterior horn of the
lateral meniscus.
2. Mild increased signal in the posterior horn of the medial
meniscus consistent with mild degeneration. No discrete tear.
3. Tricompartmental cartilage abnormalities as described above, most
severe in the lateral femorotibial compartment. The cartilage
abnormalities have progressed compared with 10/29/2014.

## 2018-05-24 IMAGING — MR MR KNEE*R* W/O CM
6 series · 40 of 40 positions shown · non-contrast
Comparison: None.

CLINICAL DATA: Right knee pain since hip replacement. Burning and
popping. Weakness.

EXAM:
MRI OF THE RIGHT KNEE WITHOUT CONTRAST
TECHNIQUE: Multiplanar, multisequence MR imaging of the knee was performed. No
intravenous contrast was administered.

[Series 3: PD fat-sat · axial · 3.0mm · 0.33mm/px · z∈[-59,+59]mm · 8 of 37 slices shown (1 of 4)]
[im 1/37]
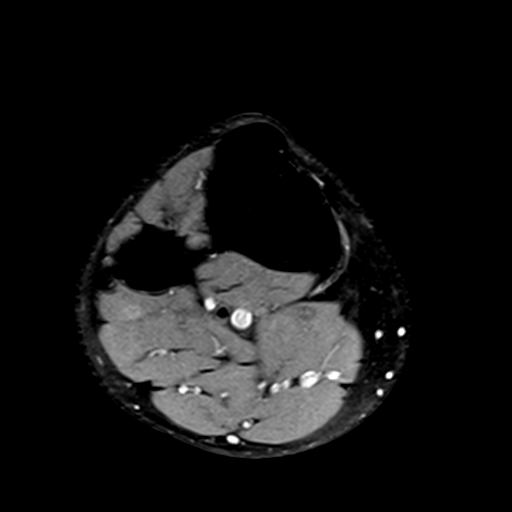
[im 6/37]
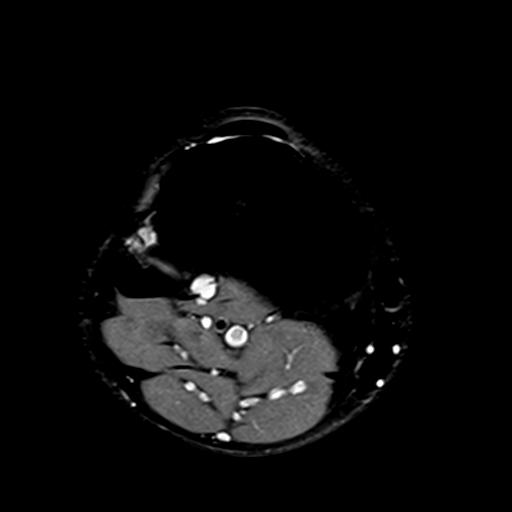
[im 11/37]
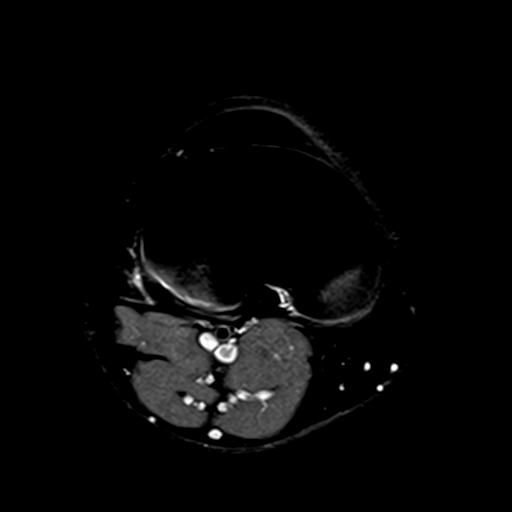
[im 16/37]
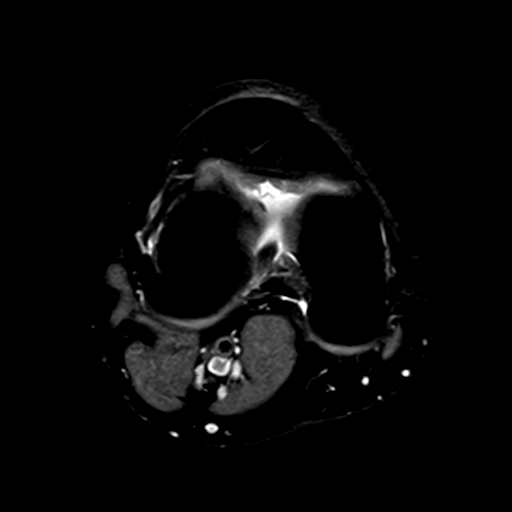
[im 21/37]
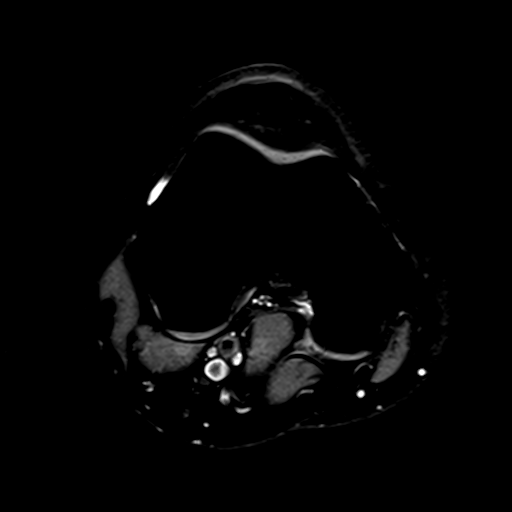
[im 26/37]
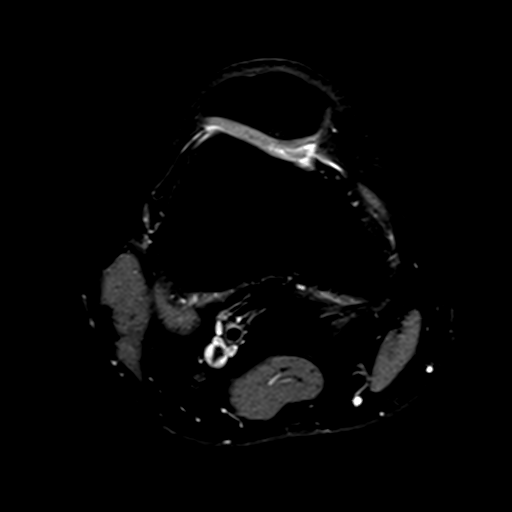
[im 31/37]
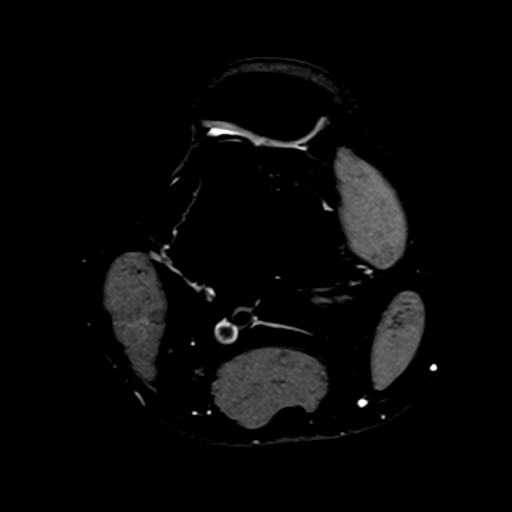
[im 37/37]
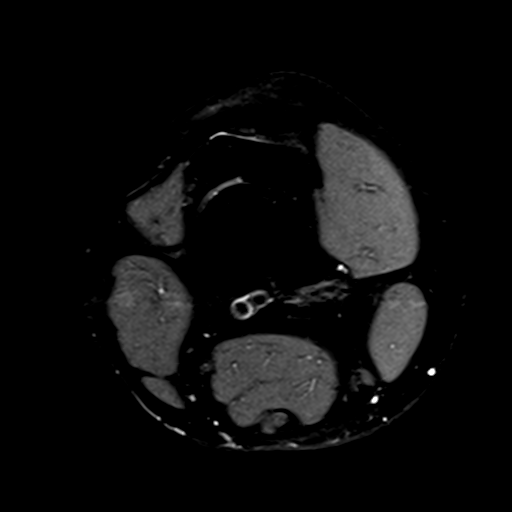

[Series 4: T1 · coronal · 3.0mm · 0.50mm/px · 7 of 33 slices shown]
[im 1/33]
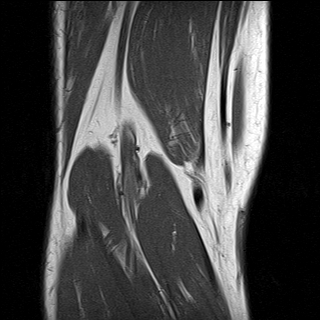
[im 6/33]
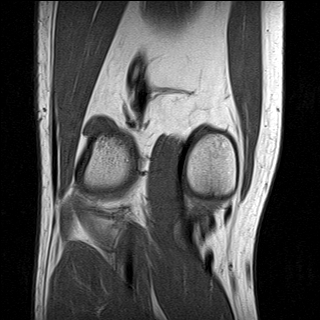
[im 11/33]
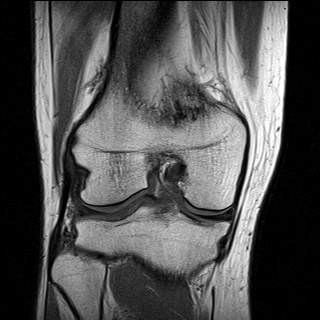
[im 17/33]
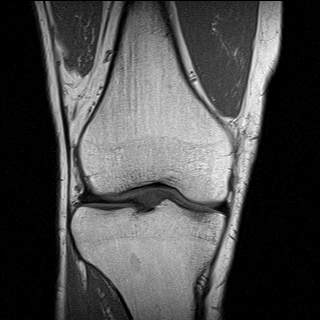
[im 22/33]
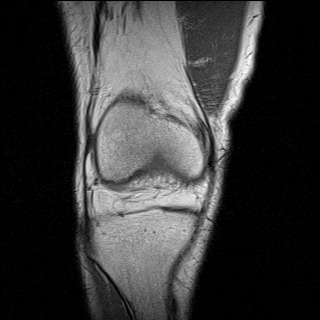
[im 27/33]
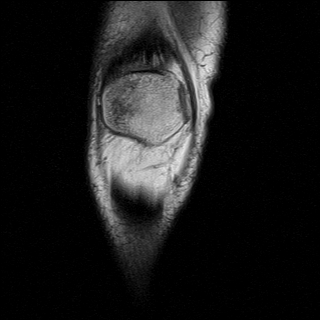
[im 33/33]
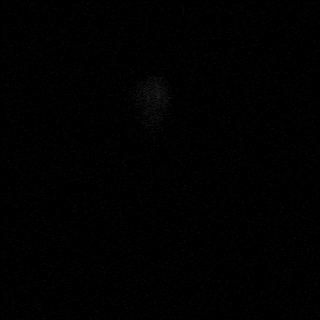

[Series 5: T2 fat-sat · coronal · 3.0mm · 0.50mm/px · 7 of 33 slices shown]
[im 1/33]
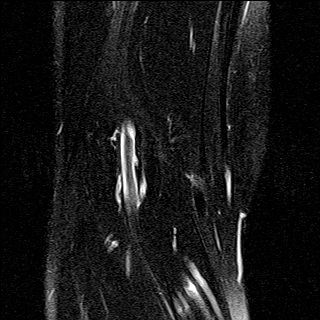
[im 6/33]
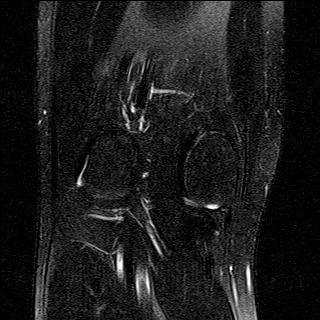
[im 11/33]
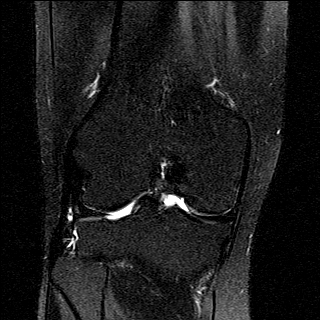
[im 17/33]
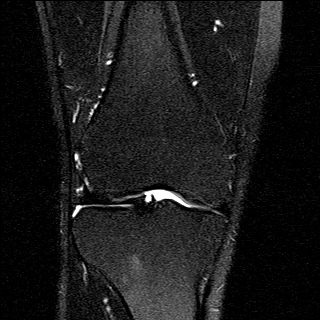
[im 22/33]
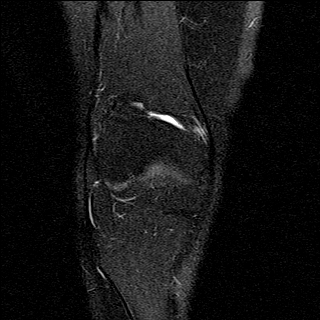
[im 27/33]
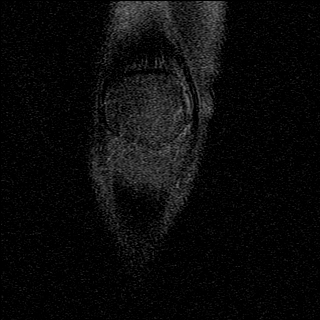
[im 33/33]
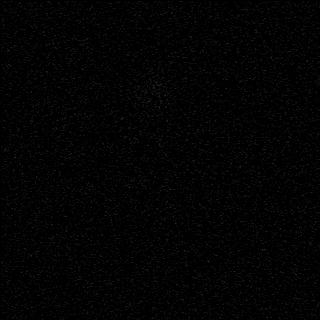

[Series 6: PD fat-sat · coronal · 3.0mm · 0.62mm/px · 7 of 33 slices shown (2 of 4)]
[im 1/33]
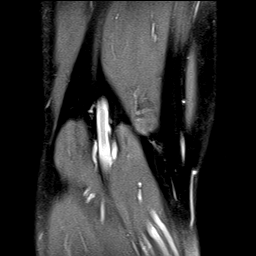
[im 6/33]
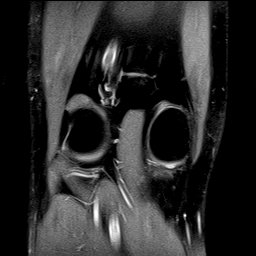
[im 11/33]
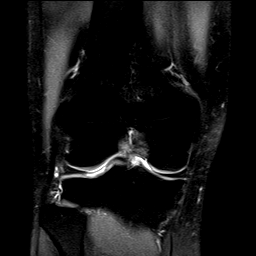
[im 17/33]
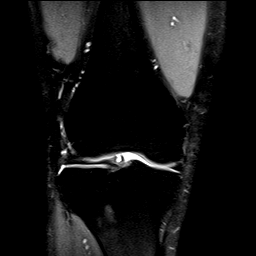
[im 22/33]
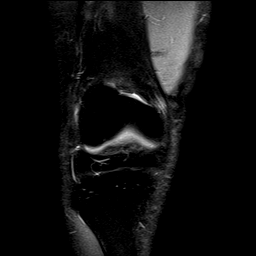
[im 27/33]
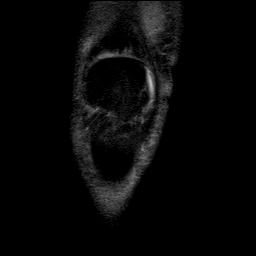
[im 33/33]
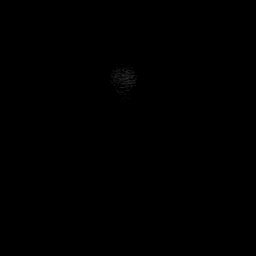

[Series 7: PD fat-sat · sagittal · 3.0mm · 0.62mm/px · 8 of 35 slices shown (3 of 4)]
[im 1/35]
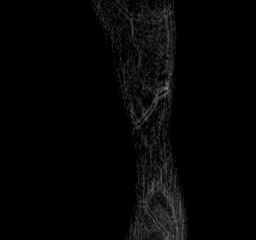
[im 5/35]
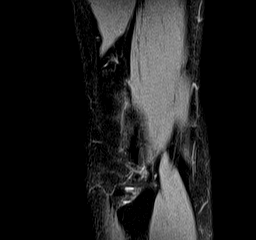
[im 10/35]
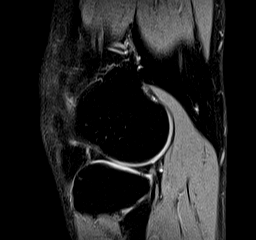
[im 15/35]
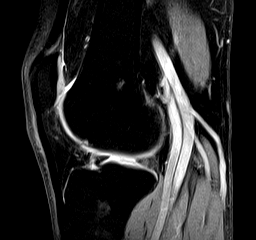
[im 20/35]
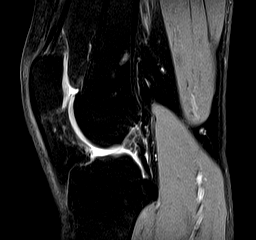
[im 25/35]
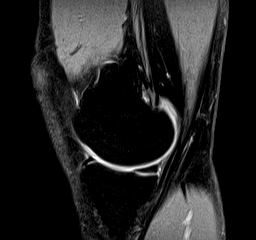
[im 30/35]
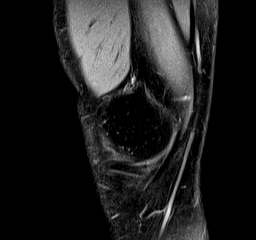
[im 35/35]
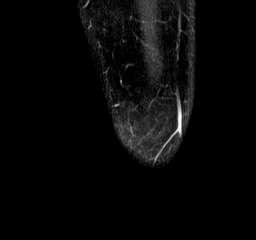

[Series 8: PD fat-sat · oblique · 2.0mm · 0.62mm/px · 3 of 12 slices shown (4 of 4)]
[im 1/12]
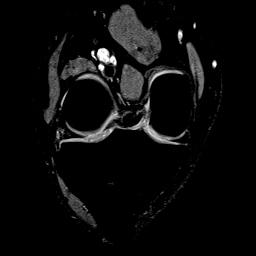
[im 6/12]
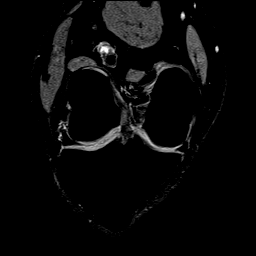
[im 12/12]
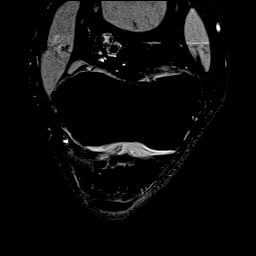

[40 of 40 positions shown; findings below may reference images not displayed]

FINDINGS: MENISCI

Medial meniscus:  Intact.

Lateral meniscus:  Intact.

LIGAMENTS

Cruciates:  Intact ACL and PCL.

Collaterals: Medial collateral ligament is intact. Lateral
collateral ligament complex is intact.

CARTILAGE

Patellofemoral: Chondromalacia of the medial and lateral patellar
facets. Partial-thickness cartilage loss of the lateral trochlea.

Medial:  Mild cartilage thinning without a chondral defect.

Lateral: High-grade partial-thickness cartilage loss with areas of
full-thickness cartilage loss involving the medial aspect of the
lateral tibial plateau. Partial-thickness cartilage loss of the
lateral femoral condyle.

Joint: No joint effusion. Normal Hoffa's fat. No plical thickening.

Popliteal Fossa:  No Baker cyst.  Intact popliteus tendon.

Extensor Mechanism: Intact quadriceps tendon and patellar tendon.
Intact medial and lateral patellar retinaculum. Intact MPFL.

Bones:  No marrow signal abnormality.  No fracture or dislocation.

Other: No fluid collection or hematoma.
IMPRESSION: 1. No meniscal or ligamentous injury of the right knee.
2. Tricompartmental cartilage abnormalities as described above most
severe in the lateral femorotibial compartment.

## 2018-06-15 ENCOUNTER — Encounter: Payer: Self-pay | Admitting: Sports Medicine

## 2018-06-15 ENCOUNTER — Other Ambulatory Visit: Payer: Self-pay | Admitting: Sports Medicine

## 2018-06-15 DIAGNOSIS — M5416 Radiculopathy, lumbar region: Secondary | ICD-10-CM

## 2018-06-16 ENCOUNTER — Other Ambulatory Visit: Payer: Self-pay | Admitting: Sports Medicine

## 2018-06-22 ENCOUNTER — Ambulatory Visit (INDEPENDENT_AMBULATORY_CARE_PROVIDER_SITE_OTHER): Payer: No Typology Code available for payment source | Admitting: Sports Medicine

## 2018-06-22 DIAGNOSIS — M7541 Impingement syndrome of right shoulder: Secondary | ICD-10-CM

## 2018-06-22 DIAGNOSIS — L719 Rosacea, unspecified: Secondary | ICD-10-CM

## 2018-06-22 DIAGNOSIS — M7542 Impingement syndrome of left shoulder: Secondary | ICD-10-CM | POA: Diagnosis not present

## 2018-06-22 DIAGNOSIS — N5201 Erectile dysfunction due to arterial insufficiency: Secondary | ICD-10-CM | POA: Diagnosis not present

## 2018-06-22 MED ORDER — TADALAFIL 10 MG PO TABS: 10.0000 mg | ORAL_TABLET | Freq: Every day | ORAL | 11 refills | Status: DC | PRN

## 2018-06-22 NOTE — Assessment & Plan Note (Signed)
Did extremely well with azelaic acid and topical metronidazole. Persistent telangiectasias on the left side of the nose, at this point I think he likely needs phototherapy or topical electrosurgery. Referral to dermatology.

## 2018-06-22 NOTE — Progress Notes (Signed)
Subjective:    CC: Left shoulder pain  HPI: This is a pleasant 48 year old male, for the past several weeks has had pain in his left shoulder, localized over the deltoid and worse with abduction.  This feels similar to his right shoulder pain from several months past, which was diagnosed as impingement syndrome, injected, and resolved.  Erythro-telangiectatic rosacea: Improved considerably with azelaic acid and metronidazole.  He still has some presence of telangiectasias and spider veins on the left side of his nose.   I reviewed the past medical history, family history, social history, surgical history, and allergies today and no changes were needed.  Please see the problem list section below in epic for further details.  Past Medical History: Past Medical History:  Diagnosis Date  . Acute bilateral knee pain 05/06/2016  . Back injury   . GERD (gastroesophageal reflux disease)   . Hip fracture, right, sequela 05/14/2016  . History of hiatal hernia    Past Surgical History: Past Surgical History:  Procedure Laterality Date  . CHONDROPLASTY Left 11/07/2014   Procedure: CHONDROPLASTY;  Surgeon: Jodi GeraldsJohn Graves, MD;  Location: [facility];  Service: Orthopedics;  Laterality: Left;  . KNEE ARTHROSCOPY WITH LATERAL MENISECTOMY Left 11/07/2014   Procedure: Knee arthroscopy with lateral menisectomy, with lateral plica;  Surgeon: Jodi GeraldsJohn Graves, MD;  Location: [facility];  Service: Orthopedics;  Laterality: Left;  . KNEE SURGERY     Social History: Social History   Socioeconomic History  . Marital status: Single    Spouse name: Not on file  . Number of children: Not on file  . Years of education: Not on file  . Highest education level: Not on file  Occupational History  . Not on file  Social Needs  . Financial resource strain: Not on file  . Food insecurity:    Worry: Not on file    Inability: Not on file  . Transportation needs:    Medical: Not on file    Non-medical: Not on file  Tobacco Use  . Smoking status: Never Smoker  . Smokeless tobacco: Current User    Types: Chew  Substance and Sexual Activity  . Alcohol use: No    Comment: Quit 7 weeks ago  . Drug use: No  . Sexual activity: Yes    Partners: Female  Lifestyle  . Physical activity:    Days per week: Not on file    Minutes per session: Not on file  . Stress: Not on file  Relationships  . Social connections:    Talks on phone: Not on file    Gets together: Not on file    Attends religious service: Not on file    Active member of club or organization: Not on file    Attends meetings of clubs or organizations: Not on file    Relationship status: Not on file  Other Topics Concern  . Not on file  Social History Narrative  . Not on file   Family History: No family history on file. Allergies: No Known Allergies Medications: See med rec.  Review of Systems: No fevers, chills, night sweats, weight loss, chest pain, or shortness of breath.   Objective:    General: Well Developed, well nourished, and in no acute distress.  Neuro: Alert and oriented x3, extra-ocular muscles intact, sensation grossly intact.  HEENT: Normocephalic, atraumatic, pupils equal round reactive to light, neck supple, no masses, no lymphadenopathy, thyroid nonpalpable.  Skin: Warm and dry, no rashes. Cardiac:  Regular rate and rhythm, no murmurs rubs or gallops, no lower extremity edema.  Respiratory: Clear to auscultation bilaterally. Not using accessory muscles, speaking in full sentences. Left shoulder: Inspection reveals no abnormalities, atrophy or asymmetry. Palpation is normal with no tenderness over AC joint or bicipital groove. ROM is full in all planes. Rotator cuff strength normal throughout. Positive Neer and Hawkin's tests, empty can. Speeds and Yergason's tests normal. No labral pathology noted with negative Obrien's, negative crank, negative clunk, and good stability. Normal  scapular function observed. No painful arc and no drop arm sign. No apprehension sign  Procedure: Real-time Ultrasound Guided injection of the left subacromial bursa Device: GE Logiq E  Verbal informed consent obtained.  Time-out conducted.  Noted no overlying erythema, induration, or other signs of local infection.  Skin prepped in a sterile fashion.  Local anesthesia: Topical Ethyl chloride.  With sterile technique and under real time ultrasound guidance:  1 cc Kenalog 40, 1 cc lidocaine, 1 cc bupivacaine injected easily Completed without difficulty  Pain immediately resolved suggesting accurate placement of the medication.  Advised to call if fevers/chills, erythema, induration, drainage, or persistent bleeding.  Images permanently stored and available for review in the ultrasound unit.  Impression: Technically successful ultrasound guided injection.  Impression and Recommendations:    Impingement syndrome of both shoulders We did a right subacromial injection in May 2019. Left side done today. He does need to aggressively do his rehabilitation exercises to keep this from coming back. I would like a baseline x-ray as well.  Erythrotelangiectatic rosacea Did extremely well with azelaic acid and topical metronidazole. Persistent telangiectasias on the left side of the nose, at this point I think he likely needs phototherapy or topical electrosurgery. Referral to dermatology.  Erectile dysfunction Refilling Cialis 10.   ___________________________________________ Jerry Shannon. Benjamin Stain, M.D., ABFM., CAQSM. Primary Care and Sports Medicine [facility]  Adjunct Professor of Family Medicine  [employer]

## 2018-06-22 NOTE — Assessment & Plan Note (Signed)
We did a right subacromial injection in May 2019. Left side done today. He does need to aggressively do his rehabilitation exercises to keep this from coming back. I would like a baseline x-ray as well.

## 2018-06-22 NOTE — Assessment & Plan Note (Signed)
Refilling Cialis 10.

## 2018-06-30 ENCOUNTER — Other Ambulatory Visit: Payer: Self-pay | Admitting: Sports Medicine

## 2018-07-05 ENCOUNTER — Encounter (INDEPENDENT_AMBULATORY_CARE_PROVIDER_SITE_OTHER): Payer: No Typology Code available for payment source | Admitting: Sports Medicine

## 2018-07-05 DIAGNOSIS — M549 Dorsalgia, unspecified: Secondary | ICD-10-CM | POA: Diagnosis not present

## 2018-07-06 MED ORDER — AMBULATORY NON FORMULARY MEDICATION: 5 refills | Status: DC

## 2018-07-06 NOTE — Telephone Encounter (Signed)
I spent 5 total minutes of online digital evaluation and management services. 

## 2018-07-14 ENCOUNTER — Other Ambulatory Visit: Payer: Self-pay | Admitting: Sports Medicine

## 2018-07-14 DIAGNOSIS — M5416 Radiculopathy, lumbar region: Secondary | ICD-10-CM

## 2018-07-15 ENCOUNTER — Encounter (INDEPENDENT_AMBULATORY_CARE_PROVIDER_SITE_OTHER): Payer: No Typology Code available for payment source | Admitting: Sports Medicine

## 2018-07-15 DIAGNOSIS — T148XXA Other injury of unspecified body region, initial encounter: Secondary | ICD-10-CM | POA: Diagnosis not present

## 2018-07-18 DIAGNOSIS — T148XXA Other injury of unspecified body region, initial encounter: Secondary | ICD-10-CM | POA: Insufficient documentation

## 2018-07-18 MED ORDER — MUPIROCIN 2 % EX OINT: TOPICAL_OINTMENT | CUTANEOUS | 3 refills | Status: DC

## 2018-07-18 NOTE — Telephone Encounter (Signed)
I spent 5 total minutes of online digital evaluation and management services. 

## 2018-07-18 NOTE — Assessment & Plan Note (Signed)
Adding topical mupirocin, he will keep it dry, clean.

## 2018-07-21 ENCOUNTER — Telehealth: Payer: Self-pay | Admitting: Sports Medicine

## 2018-07-21 NOTE — Telephone Encounter (Signed)
Patient is in need of a pain cream that was supposed to be sent to [facility]. He states that it has a muscle relaxer, antiinflammatory and numbing agent. Please advise.

## 2018-07-22 MED ORDER — AMBULATORY NON FORMULARY MEDICATION: 5 refills | Status: DC

## 2018-07-22 NOTE — Telephone Encounter (Signed)
Faxed to [facility] pharmacy

## 2018-07-26 ENCOUNTER — Encounter: Payer: Self-pay | Admitting: Sports Medicine

## 2018-07-26 ENCOUNTER — Ambulatory Visit (INDEPENDENT_AMBULATORY_CARE_PROVIDER_SITE_OTHER): Payer: No Typology Code available for payment source | Admitting: Sports Medicine

## 2018-07-26 ENCOUNTER — Ambulatory Visit: Payer: No Typology Code available for payment source | Admitting: Sports Medicine

## 2018-07-26 ENCOUNTER — Other Ambulatory Visit: Payer: Self-pay

## 2018-07-26 VITALS — BP 133/78 | HR 99 | Ht 67.0 in | Wt 183.0 lb

## 2018-07-26 DIAGNOSIS — Z20828 Contact with and (suspected) exposure to other viral communicable diseases: Secondary | ICD-10-CM | POA: Diagnosis not present

## 2018-07-26 DIAGNOSIS — M7542 Impingement syndrome of left shoulder: Secondary | ICD-10-CM

## 2018-07-26 DIAGNOSIS — M7541 Impingement syndrome of right shoulder: Secondary | ICD-10-CM

## 2018-07-26 DIAGNOSIS — L649 Androgenic alopecia, unspecified: Secondary | ICD-10-CM | POA: Diagnosis not present

## 2018-07-26 DIAGNOSIS — Z20822 Contact with and (suspected) exposure to covid-19: Secondary | ICD-10-CM

## 2018-07-26 MED ORDER — MINOXIDIL 5 % EX FOAM: 1.0000 "application " | Freq: Two times a day (BID) | CUTANEOUS | 11 refills | Status: AC

## 2018-07-26 MED ORDER — AMBULATORY NON FORMULARY MEDICATION: 5 refills | Status: DC

## 2018-07-26 MED ORDER — FINASTERIDE 1 MG PO TABS: 1.0000 mg | ORAL_TABLET | Freq: Every day | ORAL | 3 refills | Status: DC

## 2018-07-26 NOTE — Progress Notes (Signed)
Subjective:    CC: Left shoulder pain  HPI: This is a pleasant 48 year old male, we injected his left shoulder about 1 month ago, he was doing extremely well until trying to do a bicep curl, felt a pop over the deltoid, and had immediate swelling, and weakness.  Symptoms were severe but improving considerably.  At this point his motion is much better than when he called yesterday.  In addition he is noted some thinning of his hair, wondering if we have anything to slow that.  I reviewed the past medical history, family history, social history, surgical history, and allergies today and no changes were needed.  Please see the problem list section below in epic for further details.  Past Medical History: Past Medical History:  Diagnosis Date  . Acute bilateral knee pain 05/06/2016  . Back injury   . GERD (gastroesophageal reflux disease)   . Hip fracture, right, sequela 05/14/2016  . History of hiatal hernia    Past Surgical History: Past Surgical History:  Procedure Laterality Date  . CHONDROPLASTY Left 11/07/2014   Procedure: CHONDROPLASTY;  Surgeon: Jodi Geralds, MD;  Location: [facility];  Service: Orthopedics;  Laterality: Left;  . KNEE ARTHROSCOPY WITH LATERAL MENISECTOMY Left 11/07/2014   Procedure: Knee arthroscopy with lateral menisectomy, with lateral plica;  Surgeon: Jodi Geralds, MD;  Location: [facility];  Service: Orthopedics;  Laterality: Left;  . KNEE SURGERY     Social History: Social History   Socioeconomic History  . Marital status: Single    Spouse name: Not on file  . Number of children: Not on file  . Years of education: Not on file  . Highest education level: Not on file  Occupational History  . Not on file  Social Needs  . Financial resource strain: Not on file  . Food insecurity:    Worry: Not on file    Inability: Not on file  . Transportation needs:    Medical: Not on file    Non-medical: Not on file  Tobacco Use  .  Smoking status: Never Smoker  . Smokeless tobacco: Current User    Types: Chew  Substance and Sexual Activity  . Alcohol use: No    Comment: Quit 7 weeks ago  . Drug use: No  . Sexual activity: Yes    Partners: Female  Lifestyle  . Physical activity:    Days per week: Not on file    Minutes per session: Not on file  . Stress: Not on file  Relationships  . Social connections:    Talks on phone: Not on file    Gets together: Not on file    Attends religious service: Not on file    Active member of club or organization: Not on file    Attends meetings of clubs or organizations: Not on file    Relationship status: Not on file  Other Topics Concern  . Not on file  Social History Narrative  . Not on file   Family History: No family history on file. Allergies: No Known Allergies Medications: See med rec.  Review of Systems: No fevers, chills, night sweats, weight loss, chest pain, or shortness of breath.   Objective:    General: Well Developed, well nourished, and in no acute distress.  Neuro: Alert and oriented x3, extra-ocular muscles intact, sensation grossly intact.  HEENT: Normocephalic, atraumatic, pupils equal round reactive to light, neck supple, no masses, no lymphadenopathy, thyroid nonpalpable.  Skin: Warm and dry,  no rashes. Cardiac: Regular rate and rhythm, no murmurs rubs or gallops, no lower extremity edema.  Respiratory: Clear to auscultation bilaterally. Not using accessory muscles, speaking in full sentences. Left shoulder: Inspection reveals no abnormalities, atrophy or asymmetry. Palpation is normal with no tenderness over AC joint or bicipital groove. ROM is full in all planes. Weak to external rotation No signs of impingement with negative Neer and Hawkin's tests, empty can. Speeds and Yergason's tests normal. No labral pathology noted with negative Obrien's, negative crank, negative clunk, and good stability. Normal scapular function observed. No  painful arc and no drop arm sign. No apprehension sign  Impression and Recommendations:    Impingement syndrome of both shoulders Right shoulder continues to do well. We injected the left shoulder about a month ago, while lifting weights he felt a pop, he now has some weakness to external rotation concerning for a tear of the infraspinatus. It is improving so we will give this another 2 weeks before aggressively intervening.  Male pattern baldness Finasteride and Rogaine.   ___________________________________________ Ihor Austinhomas J. Benjamin Stainhekkekandam, M.D., ABFM., CAQSM. Primary Care and Sports Medicine [facility]  Adjunct Professor of Family Medicine  [employer]

## 2018-07-26 NOTE — Assessment & Plan Note (Signed)
Finasteride and Rogaine.

## 2018-07-26 NOTE — Assessment & Plan Note (Signed)
Right shoulder continues to do well. We injected the left shoulder about a month ago, while lifting weights he felt a pop, he now has some weakness to external rotation concerning for a tear of the infraspinatus. It is improving so we will give this another 2 weeks before aggressively intervening.

## 2018-07-27 LAB — SAR COV2 SEROLOGY (COVID19)AB(IGG),IA: SARS CoV2 AB IGG: NEGATIVE

## 2018-07-29 ENCOUNTER — Other Ambulatory Visit: Payer: Self-pay | Admitting: Sports Medicine

## 2018-08-07 ENCOUNTER — Encounter: Payer: Self-pay | Admitting: Sports Medicine

## 2018-08-09 ENCOUNTER — Ambulatory Visit (INDEPENDENT_AMBULATORY_CARE_PROVIDER_SITE_OTHER): Payer: No Typology Code available for payment source | Admitting: Sports Medicine

## 2018-08-09 DIAGNOSIS — M7542 Impingement syndrome of left shoulder: Secondary | ICD-10-CM

## 2018-08-09 DIAGNOSIS — W57XXXA Bitten or stung by nonvenomous insect and other nonvenomous arthropods, initial encounter: Secondary | ICD-10-CM | POA: Diagnosis not present

## 2018-08-09 DIAGNOSIS — M7541 Impingement syndrome of right shoulder: Secondary | ICD-10-CM

## 2018-08-09 DIAGNOSIS — S40262A Insect bite (nonvenomous) of left shoulder, initial encounter: Secondary | ICD-10-CM | POA: Diagnosis not present

## 2018-08-09 MED ORDER — DOXYCYCLINE HYCLATE 100 MG PO TABS: 100.0000 mg | ORAL_TABLET | Freq: Two times a day (BID) | ORAL | 0 refills | Status: AC

## 2018-08-09 NOTE — Assessment & Plan Note (Signed)
Suspect wasp sting. There is some surrounding induration, erythema, for this reason we are going to add a bit of doxycycline. Return as needed for this.

## 2018-08-09 NOTE — Progress Notes (Signed)
Subjective:    CC: Left shoulder pain  HPI: Jerry Shannon is a pleasant 48 year old male, we have been treating him for left shoulder pain, he had a weak external rotation suspicious for infraspinatus injury.  Symptoms are improving considerably but he is really interested in a subacromial injection today.  In addition he was bitten by what he thinks is a wasp on the right posterior shoulder, it is painful, red, inflamed.  I reviewed the past medical history, family history, social history, surgical history, and allergies today and no changes were needed.  Please see the problem list section below in epic for further details.  Past Medical History: Past Medical History:  Diagnosis Date  . Acute bilateral knee pain 05/06/2016  . Back injury   . GERD (gastroesophageal reflux disease)   . Hip fracture, right, sequela 05/14/2016  . History of hiatal hernia    Past Surgical History: Past Surgical History:  Procedure Laterality Date  . CHONDROPLASTY Left 11/07/2014   Procedure: CHONDROPLASTY;  Surgeon: Jodi GeraldsJohn Graves, MD;  Location: [facility];  Service: Orthopedics;  Laterality: Left;  . KNEE ARTHROSCOPY WITH LATERAL MENISECTOMY Left 11/07/2014   Procedure: Knee arthroscopy with lateral menisectomy, with lateral plica;  Surgeon: Jodi GeraldsJohn Graves, MD;  Location: [facility];  Service: Orthopedics;  Laterality: Left;  . KNEE SURGERY     Social History: Social History   Socioeconomic History  . Marital status: Single    Spouse name: Not on file  . Number of children: Not on file  . Years of education: Not on file  . Highest education level: Not on file  Occupational History  . Not on file  Social Needs  . Financial resource strain: Not on file  . Food insecurity:    Worry: Not on file    Inability: Not on file  . Transportation needs:    Medical: Not on file    Non-medical: Not on file  Tobacco Use  . Smoking status: Never Smoker  . Smokeless tobacco: Current  User    Types: Chew  Substance and Sexual Activity  . Alcohol use: No    Comment: Quit 7 weeks ago  . Drug use: No  . Sexual activity: Yes    Partners: Female  Lifestyle  . Physical activity:    Days per week: Not on file    Minutes per session: Not on file  . Stress: Not on file  Relationships  . Social connections:    Talks on phone: Not on file    Gets together: Not on file    Attends religious service: Not on file    Active member of club or organization: Not on file    Attends meetings of clubs or organizations: Not on file    Relationship status: Not on file  Other Topics Concern  . Not on file  Social History Narrative  . Not on file   Family History: No family history on file. Allergies: No Known Allergies Medications: See med rec.  Review of Systems: No fevers, chills, night sweats, weight loss, chest pain, or shortness of breath.   Objective:    General: Well Developed, well nourished, and in no acute distress.  Neuro: Alert and oriented x3, extra-ocular muscles intact, sensation grossly intact.  HEENT: Normocephalic, atraumatic, pupils equal round reactive to light, neck supple, no masses, no lymphadenopathy, thyroid nonpalpable.  Skin: Warm and dry, no rashes.  There is a 1 to 2 cm papule over the right posterior shoulder  with surrounding erythema, induration. Cardiac: Regular rate and rhythm, no murmurs rubs or gallops, no lower extremity edema.  Respiratory: Clear to auscultation bilaterally. Not using accessory muscles, speaking in full sentences. Left shoulder: Inspection reveals no abnormalities, atrophy or asymmetry. Palpation is normal with no tenderness over AC joint or bicipital groove. ROM is full in all planes. Rotator cuff strength slightly weak to external rotation but much better than before No signs of impingement with negative Neer and Hawkin's tests, empty can. Speeds and Yergason's tests normal. No labral pathology noted with negative  Obrien's, negative crank, negative clunk, and good stability. Normal scapular function observed. No painful arc and no drop arm sign. No apprehension sign  Procedure: Real-time Ultrasound Guided injection of the left subacromial bursa Device: GE Logiq E  Verbal informed consent obtained.  Time-out conducted.  Noted no overlying erythema, induration, or other signs of local infection.  Skin prepped in a sterile fashion.  Local anesthesia: Topical Ethyl chloride.  With sterile technique and under real time ultrasound guidance:  1 cc Kenalog 40, 1 cc lidocaine, 1 cc bupivacaine injected easily Completed without difficulty  Pain immediately resolved suggesting accurate placement of the medication.  Advised to call if fevers/chills, erythema, induration, drainage, or persistent bleeding.  Images permanently stored and available for review in the ultrasound unit.  Impression: Technically successful ultrasound guided injection.  Impression and Recommendations:    Impingement syndrome of both shoulders Right shoulder continues to do well. I did perform a ultrasound-guided subacromial left-sided injection today. He will avoid working out for 5 days. Return to see me in a month for this. Infraspinatus strength was a lot better today. If insufficient relief we will proceed with MRI of the left shoulder.  Insect bite Suspect wasp sting. There is some surrounding induration, erythema, for this reason we are going to add a bit of doxycycline. Return as needed for this.   ___________________________________________ Jerry Shannon. Jerry Shannon, M.D., ABFM., CAQSM. Primary Care and Sports Medicine [facility]  Adjunct Professor of Family Medicine  [employer]

## 2018-08-09 NOTE — Assessment & Plan Note (Signed)
Right shoulder continues to do well. I did perform a ultrasound-guided subacromial left-sided injection today. He will avoid working out for 5 days. Return to see me in a month for this. Infraspinatus strength was a lot better today. If insufficient relief we will proceed with MRI of the left shoulder.

## 2018-08-10 ENCOUNTER — Other Ambulatory Visit: Payer: Self-pay | Admitting: Sports Medicine

## 2018-08-10 DIAGNOSIS — M5416 Radiculopathy, lumbar region: Secondary | ICD-10-CM

## 2018-08-11 ENCOUNTER — Other Ambulatory Visit: Payer: Self-pay | Admitting: Sports Medicine

## 2018-08-11 DIAGNOSIS — M5416 Radiculopathy, lumbar region: Secondary | ICD-10-CM

## 2018-08-23 ENCOUNTER — Telehealth: Payer: Self-pay | Admitting: *Deleted

## 2018-08-23 DIAGNOSIS — G8929 Other chronic pain: Secondary | ICD-10-CM

## 2018-08-23 DIAGNOSIS — M5412 Radiculopathy, cervical region: Secondary | ICD-10-CM

## 2018-08-23 DIAGNOSIS — M7542 Impingement syndrome of left shoulder: Secondary | ICD-10-CM

## 2018-08-23 DIAGNOSIS — M7541 Impingement syndrome of right shoulder: Secondary | ICD-10-CM

## 2018-08-23 DIAGNOSIS — M25512 Pain in left shoulder: Secondary | ICD-10-CM

## 2018-08-23 NOTE — Telephone Encounter (Signed)
Recurrence of left-sided cervical radiculitis, MRI from 2013 did show C5-C7 DDD. Persistent discomfort. He has done years of physician directed conservative measures. Adding another x-ray, as well as ordering MRI. He needs to get the x-ray done first and then they can approve the MRI.

## 2018-08-23 NOTE — Telephone Encounter (Signed)
Pt left vm wanting you to order an MRI for him.  His symptoms are the shoulder pain, left pinky numbness, and left "bicep inflammation".  He stated that some years back he had bulging disks in his neck and he thinks that's what is happening again.

## 2018-08-23 NOTE — Assessment & Plan Note (Signed)
Recurrence of left-sided cervical radiculitis, MRI from 2013 did show C5-C7 DDD. Persistent discomfort. He has done years of physician directed conservative measures. Adding another x-ray, as well as ordering MRI. He needs to get the x-ray done first and then they can approve the MRI. 

## 2018-08-24 NOTE — Telephone Encounter (Signed)
Patient advised. We will fax records for MRI approval after he does the xray.

## 2018-08-26 ENCOUNTER — Other Ambulatory Visit: Payer: Self-pay | Admitting: Sports Medicine

## 2018-08-29 NOTE — Telephone Encounter (Signed)
Called pt concerning MRI. States he is hesitant abut getting the neck MRI just because of the cost. States he can probably only afford to have one MRI done at a time and will have to wait on the other, and currently he is more worried about his shoulder.   Patient will need shoulder x-ray done for me to get MRI approved, this has been pended.   Patient wanting to know since he is going to to proceed with his shoulder MRI, can he has an epidural done in upper back?

## 2018-08-29 NOTE — Telephone Encounter (Signed)
I have ordered his shoulder MRI, I do still think his symptoms are coming from the neck, it would be better if he can get both done on the same day.

## 2018-08-29 NOTE — Telephone Encounter (Signed)
Pt left vm stating that he doesn't want to do the neck mri, he only wants to do his shoulder.

## 2018-08-30 NOTE — Telephone Encounter (Signed)
Left pt msg advising order in for x-ray, also advised of the need for cervical MRI in order to do epidural

## 2018-08-30 NOTE — Telephone Encounter (Signed)
Lort have mercy, this guy :-P, the point of the cervical spine MRI was for [place] to plan an epidural, we cannot do a cervical epidural without a cervical MRI.    I have signed off on the shoulder x-ray.

## 2018-08-31 ENCOUNTER — Other Ambulatory Visit: Payer: Self-pay

## 2018-08-31 ENCOUNTER — Ambulatory Visit (INDEPENDENT_AMBULATORY_CARE_PROVIDER_SITE_OTHER): Payer: No Typology Code available for payment source

## 2018-08-31 DIAGNOSIS — M5412 Radiculopathy, cervical region: Secondary | ICD-10-CM | POA: Diagnosis not present

## 2018-08-31 DIAGNOSIS — M7541 Impingement syndrome of right shoulder: Secondary | ICD-10-CM | POA: Diagnosis not present

## 2018-08-31 DIAGNOSIS — M7542 Impingement syndrome of left shoulder: Secondary | ICD-10-CM | POA: Diagnosis not present

## 2018-09-01 ENCOUNTER — Telehealth: Payer: Self-pay | Admitting: Sports Medicine

## 2018-09-01 NOTE — Telephone Encounter (Signed)
Called insurance to get prior auth for MR left shoulder w/o contrast. Per insurance no prior auth required. Given to [employer] to enter in computer. KG LPN

## 2018-09-02 ENCOUNTER — Encounter: Payer: Self-pay | Admitting: Sports Medicine

## 2018-09-02 ENCOUNTER — Ambulatory Visit (INDEPENDENT_AMBULATORY_CARE_PROVIDER_SITE_OTHER): Payer: No Typology Code available for payment source | Admitting: Sports Medicine

## 2018-09-02 DIAGNOSIS — M7541 Impingement syndrome of right shoulder: Secondary | ICD-10-CM

## 2018-09-02 DIAGNOSIS — M7542 Impingement syndrome of left shoulder: Secondary | ICD-10-CM

## 2018-09-02 DIAGNOSIS — M8580 Other specified disorders of bone density and structure, unspecified site: Secondary | ICD-10-CM | POA: Insufficient documentation

## 2018-09-02 NOTE — Assessment & Plan Note (Signed)
Confirmation with a bone density test.

## 2018-09-02 NOTE — Progress Notes (Signed)
Virtual Visit via WebEx/MyChart   I connected with  Jerry Shannon  on 09/02/18 via WebEx/MyChart/Doximity Video and verified that I am speaking with the correct person using two identifiers.   I discussed the limitations, risks, security and privacy concerns of performing an evaluation and management service by WebEx/MyChart/Doximity Video, including the higher likelihood of inaccurate diagnosis and treatment, and the availability of in person appointments.  We also discussed the likely need of an additional face to face encounter for complete and high quality delivery of care.  I also discussed with the patient that there may be a patient responsible charge related to this service. The patient expressed understanding and wishes to proceed.  Provider location is either at home or medical facility. Patient location is at their home, different from provider location. People involved in care of the patient during this telehealth encounter were myself, my nurse/medical assistant, and my front office/scheduling team member.  Subjective:    CC: Follow-up  HPI: Jerry Shannon has his MRI coming up.  He just wanted to go over some of his x-rays.  I reviewed the past medical history, family history, social history, surgical history, and allergies today and no changes were needed.  Please see the problem list section below in epic for further details.  Past Medical History: Past Medical History:  Diagnosis Date  . Acute bilateral knee pain 05/06/2016  . Back injury   . GERD (gastroesophageal reflux disease)   . Hip fracture, right, sequela 05/14/2016  . History of hiatal hernia    Past Surgical History: Past Surgical History:  Procedure Laterality Date  . CHONDROPLASTY Left 11/07/2014   Procedure: CHONDROPLASTY;  Surgeon: Dorna Leitz, MD;  Location: [place] [facility];  Service: Orthopedics;  Laterality: Left;  . KNEE ARTHROSCOPY WITH LATERAL MENISECTOMY Left 11/07/2014   Procedure: Knee  arthroscopy with lateral menisectomy, with lateral plica;  Surgeon: Dorna Leitz, MD;  Location: [facility];  Service: Orthopedics;  Laterality: Left;  . KNEE SURGERY     Social History: Social History   Socioeconomic History  . Marital status: Single    Spouse name: Not on file  . Number of children: Not on file  . Years of education: Not on file  . Highest education level: Not on file  Occupational History  . Not on file  Social Needs  . Financial resource strain: Not on file  . Food insecurity    Worry: Not on file    Inability: Not on file  . Transportation needs    Medical: Not on file    Non-medical: Not on file  Tobacco Use  . Smoking status: Never Smoker  . Smokeless tobacco: Current User    Types: Chew  Substance and Sexual Activity  . Alcohol use: No    Comment: Quit 7 weeks ago  . Drug use: No  . Sexual activity: Yes    Partners: Female  Lifestyle  . Physical activity    Days per week: Not on file    Minutes per session: Not on file  . Stress: Not on file  Relationships  . Social Herbalist on phone: Not on file    Gets together: Not on file    Attends religious service: Not on file    Active member of club or organization: Not on file    Attends meetings of clubs or organizations: Not on file    Relationship status: Not on file  Other Topics Concern  .  Not on file  Social History Narrative  . Not on file   Family History: No family history on file. Allergies: No Known Allergies Medications: See med rec.  Review of Systems: No fevers, chills, night sweats, weight loss, chest pain, or shortness of breath.   Objective:    General: Speaking full sentences, no audible heavy breathing.  Sounds alert and appropriately interactive.  Appears well.  Face symmetric.  Extraocular movements intact.  Pupils equal and round.  No nasal flaring or accessory muscle use visualized.  No other physical exam performed due to the non-physical  nature of this visit.  Impression and Recommendations:    Impingement syndrome of both shoulders Right shoulder continues to do well. Left-sided continued to hurt. MRI is scheduled this weekend.   We will probably set him up with Dr. Everardo PacificVarkey afterwards. He does understand that some of this is coming from his neck.  Osteopenia determined by x-ray Confirmation with a bone density test.  I discussed the above assessment and treatment plan with the patient. The patient was provided an opportunity to ask questions and all were answered. The patient agreed with the plan and demonstrated an understanding of the instructions.   The patient was advised to call back or seek an in-person evaluation if the symptoms worsen or if the condition fails to improve as anticipated.   I provided 25 minutes of non-face-to-face time during this encounter, 15 minutes of additional time was needed to gather information, review chart, records, communicate/coordinate with staff remotely, troubleshooting the multiple errors that we get every time when trying to do video calls through the electronic medical record, WebEx, and Doximity, restart the encounter multiple times due to instability of the software, as well as complete documentation.   ___________________________________________ Ihor Austinhomas J. Benjamin Stainhekkekandam, M.D., ABFM., CAQSM. Primary Care and Sports Medicine  [facility]  Adjunct Professor of Family Medicine  [employer]

## 2018-09-02 NOTE — Assessment & Plan Note (Signed)
Right shoulder continues to do well. Left-sided continued to hurt. MRI is scheduled this weekend.   We will probably set him up with Dr. Griffin Basil afterwards. He does understand that some of this is coming from his neck.

## 2018-09-04 ENCOUNTER — Ambulatory Visit (INDEPENDENT_AMBULATORY_CARE_PROVIDER_SITE_OTHER): Payer: No Typology Code available for payment source

## 2018-09-04 ENCOUNTER — Other Ambulatory Visit: Payer: Self-pay

## 2018-09-04 DIAGNOSIS — M25512 Pain in left shoulder: Secondary | ICD-10-CM | POA: Diagnosis not present

## 2018-09-04 DIAGNOSIS — M7541 Impingement syndrome of right shoulder: Secondary | ICD-10-CM

## 2018-09-04 DIAGNOSIS — G8929 Other chronic pain: Secondary | ICD-10-CM | POA: Diagnosis not present

## 2018-09-04 DIAGNOSIS — M7542 Impingement syndrome of left shoulder: Secondary | ICD-10-CM | POA: Diagnosis not present

## 2018-09-05 ENCOUNTER — Encounter: Payer: Self-pay | Admitting: Sports Medicine

## 2018-09-06 ENCOUNTER — Ambulatory Visit: Payer: No Typology Code available for payment source | Admitting: Sports Medicine

## 2018-09-20 ENCOUNTER — Telehealth: Payer: Self-pay

## 2018-09-20 NOTE — Telephone Encounter (Signed)
I called patient to let him know that his cervical MRI is approved from 6/17 until 9/16. Patient is still very adamant about not wanting this since he has to pay $500 out of pocket for MRI.  Patient wanted me to send Dr T a note and ask him if he he will order him an epidural without the neck MRI....Marland Kitchen please advise...ok to send him an answer via MyChart.... thanks

## 2018-09-20 NOTE — Telephone Encounter (Signed)
Pt advised.

## 2018-09-20 NOTE — Telephone Encounter (Signed)
Naw dawg.

## 2018-09-21 ENCOUNTER — Other Ambulatory Visit: Payer: Self-pay

## 2018-09-21 ENCOUNTER — Ambulatory Visit (INDEPENDENT_AMBULATORY_CARE_PROVIDER_SITE_OTHER): Payer: No Typology Code available for payment source

## 2018-09-21 ENCOUNTER — Encounter: Payer: Self-pay | Admitting: Sports Medicine

## 2018-09-21 DIAGNOSIS — M8580 Other specified disorders of bone density and structure, unspecified site: Secondary | ICD-10-CM | POA: Diagnosis not present

## 2018-09-30 ENCOUNTER — Other Ambulatory Visit: Payer: Self-pay | Admitting: Sports Medicine

## 2018-10-13 ENCOUNTER — Encounter: Payer: Self-pay | Admitting: Sports Medicine

## 2018-10-13 DIAGNOSIS — M7542 Impingement syndrome of left shoulder: Secondary | ICD-10-CM

## 2018-10-13 DIAGNOSIS — M7541 Impingement syndrome of right shoulder: Secondary | ICD-10-CM

## 2018-10-14 ENCOUNTER — Other Ambulatory Visit: Payer: Self-pay | Admitting: Sports Medicine

## 2018-10-18 ENCOUNTER — Ambulatory Visit (INDEPENDENT_AMBULATORY_CARE_PROVIDER_SITE_OTHER): Payer: No Typology Code available for payment source | Admitting: Rehabilitative and Restorative Service Providers"

## 2018-10-18 ENCOUNTER — Other Ambulatory Visit: Payer: Self-pay

## 2018-10-18 ENCOUNTER — Encounter: Payer: Self-pay | Admitting: Rehabilitative and Restorative Service Providers"

## 2018-10-18 DIAGNOSIS — M6281 Muscle weakness (generalized): Secondary | ICD-10-CM

## 2018-10-18 DIAGNOSIS — R29898 Other symptoms and signs involving the musculoskeletal system: Secondary | ICD-10-CM

## 2018-10-18 DIAGNOSIS — M25512 Pain in left shoulder: Secondary | ICD-10-CM

## 2018-10-18 DIAGNOSIS — G8929 Other chronic pain: Secondary | ICD-10-CM

## 2018-10-18 DIAGNOSIS — M25612 Stiffness of left shoulder, not elsewhere classified: Secondary | ICD-10-CM

## 2018-10-18 NOTE — Therapy (Addendum)
[facility] [address] [place], [place], [address] Phone: [phone]   Fax:  [phone]  Physical Therapy Evaluation  Patient Details  Name: Jerry FenderLarry Cannedy MRN: [id number] Date of Birth: 07-03-1970 Referring Provider (PT): Dr Benjamin Stainhekkekandam    Encounter Date: 10/18/2018    Past Medical History:  Diagnosis Date  . Acute bilateral knee pain 05/06/2016  . Back injury   . GERD (gastroesophageal reflux disease)   . Hip fracture, right, sequela 05/14/2016  . History of hiatal hernia     Past Surgical History:  Procedure Laterality Date  . CHONDROPLASTY Left 11/07/2014   Procedure: CHONDROPLASTY;  Surgeon: Jodi GeraldsJohn Graves, MD;  Location: [facility];  Service: Orthopedics;  Laterality: Left;  . KNEE ARTHROSCOPY WITH LATERAL MENISECTOMY Left 11/07/2014   Procedure: Knee arthroscopy with lateral menisectomy, with lateral plica;  Surgeon: Jodi GeraldsJohn Graves, MD;  Location: [facility];  Service: Orthopedics;  Laterality: Left;  . KNEE SURGERY      There were no vitals filed for this visit.   Subjective Assessment - 10/18/18 0856    Subjective  Patient reports injury to Lt shoulder when lifting weights curling 50# dumb bells felt a pop about 6 months ago. He has had continued pain. He has rested arm and then he has been working his shoulder again - has done yoga. He is unable to sleep. Concerned about his performance in the gym and wants to get back to lifting. Has read about US and is here to try the US.    Pertinent History  THA on Rt plus revision; 3 Lt knee surgeries; Fx ankle, hand; nose; hernias    Currently in Pain?  Yes    Pain Score  0-No pain    Pain Location  Shoulder    Pain Orientation  Left    Pain Descriptors / Indicators  Dull;Sharp   sharp with lifting   Pain Type  Chronic pain    Aggravating Factors   lifting a gallon of milk; turning steering wheel' difficulty sleeping    Pain Relieving  Factors  nothing         [facility] PT Assessment - 10/18/18 0001      Assessment   Medical Diagnosis  Lt shoulder impingement     Referring Provider (PT)  Dr Benjamin Stainhekkekandam     Onset Date/Surgical Date  04/09/18    Hand Dominance  Right    Next MD Visit  PRN     Prior Therapy  here for hip/knee       Precautions   Precautions  None      Restrictions   Weight Bearing Restrictions  No      Prior Function   Level of Independence  Independent    Vocation  Full time employment    Theatre stage managerVocation Requirements  sales; travel; Astronomeraccounts manager     Leisure  lifting; working out in the gym 7 days a week       Observation/Other Assessments   Focus on Therapeutic Outcomes (FOTO)   50% limitation       Sensation   Additional Comments  WFL's per pt report       Posture/Postural Control   Posture Comments  head forward; shoulders rounded and elevated       AROM   Right Shoulder Extension  45 Degrees    Right Shoulder Flexion  140 Degrees    Right Shoulder ABduction  144 Degrees    Right Shoulder Internal  Rotation  28 Degrees    Right Shoulder External Rotation  95 Degrees    Left Shoulder Extension  52 Degrees    Left Shoulder Flexion  137 Degrees   pain   Left Shoulder ABduction  129 Degrees    Left Shoulder Internal Rotation  29 Degrees    Left Shoulder External Rotation  73 Degrees      Strength   Left Shoulder Flexion  4+/5   painful   Left Shoulder ABduction  4+/5   painful    Left Shoulder External Rotation  4+/5   painful      Palpation   Palpation comment  muscular tightness anterior Lt shoulder         Treatment:  Instruction in 3 way doorway stretch 30 sec x 2 reps each position  US 1.5 w/cm2; 1 mHz; 100%; 10 min anterior Lt shoulder        Objective measurements completed on examination: See above findings.              PT Education - 10/18/18 0929    Education Details  HEP    Person(s) Educated  Patient    Methods   Explanation;Demonstration;Tactile cues;Verbal cues;Handout    Comprehension  Verbalized understanding;Returned demonstration;Verbal cues required;Tactile cues required          PT Long Term Goals - 10/18/18 1046      PT LONG TERM GOAL #1   Title  Decrease shoulder pain from 5/10 to 3-4/10 for functional tasks involved in ADL's    Time  6    Period  Weeks    Status  New    Target Date  11/29/18      PT LONG TERM GOAL #2   Title  increase Lt shoulder strength =/> 5-/5  to 5/5 with minimal pain with resistive testing    Time  6    Period  Weeks    Status  New    Target Date  11/29/18      PT LONG TERM GOAL #3   Title  Increase AROM Lt shoulder to equal AROM Rt shoulder    Time  6    Period  Weeks    Status  New    Target Date  11/29/18      PT LONG TERM GOAL #4   Title  Independent in HEP    Time  6    Period  Weeks    Status  New    Target Date  11/29/18      PT LONG TERM GOAL #5   Title  Improve FOTO to </= 31% limitation    Time  6    Period  Weeks    Status  New    Target Date  11/29/18             Plan - 10/18/18 0929    Clinical Impression Statement  Patient presents with chronic Lt shoulder pain and limited function/activity level. He states that he is here for US - having researched the use of US for shoulder problems and wants to try the US. Patient has poor posture and alignment; limited shoudler ROM Lt > Rt; muscular weakness and pain with resistive testing Lt shoulder ER/abd/flexion. Patient will benefit from PT to address shoulder dysfunction.    Clinical Decision Making  Low    Rehab Potential  Good    PT Frequency  2x / week    PT Duration  6 weeks  PT Treatment/Interventions  Patient/family education;ADLs/Self Care Home Management;Cryotherapy;Electrical Stimulation;Iontophoresis 4mg /ml Dexamethasone;Moist Heat;Ultrasound;Manual techniques;Dry needling;Therapeutic activities;Therapeutic exercise;Neuromuscular re-education    PT Next Visit Plan   as patient directs - [place]; possibly DN; manual work anterior shoulder? - stretching - patient states that he is doing his stretching at home for 1 hour a day and wants to focus on [place]    PT Home Exercise Plan  [email or web address]    Consulted and Agree with Plan of Care  Patient       Patient will benefit from skilled therapeutic intervention in order to improve the following deficits and impairments:  Increased fascial restricitons, Increased muscle spasms, Pain, Impaired UE functional use, Improper body mechanics, Postural dysfunction, Decreased activity tolerance  Visit Diagnosis: 1. Chronic left shoulder pain   2. Muscle weakness (generalized)   3. Other symptoms and signs involving the musculoskeletal system   4. Stiffness of joint, shoulder region, left        Problem List Patient Active Problem List   Diagnosis Date Noted  . Osteopenia determined by x-ray 09/02/2018  . Insect bite 08/09/2018  . Male pattern baldness 07/26/2018  . Abrasion of skin 07/18/2018  . Seborrheic keratosis 04/18/2018  . Erythrotelangiectatic rosacea 04/18/2018  . Medial epicondylitis of both elbows 03/15/2018  . Excessive thirst 11/10/2017  . Annual physical exam 12/25/2016  . Impingement syndrome of both shoulders 10/27/2016  . Rectal bleeding 10/15/2016  . Arthropathy of hip 07/02/2016  . Seborrheic dermatitis of scalp 04/03/2016  . Alcohol abuse 03/13/2016  . Mood disorder ([place]) 11/06/2015  . History of arthroplasty of right hip 12/07/2014  . Excessive daytime sleepiness 11/06/2014  . Primary osteoarthritis of both knees 09/21/2014  . Insomnia 09/21/2014  . Erectile dysfunction 09/21/2014  . Hyperlipidemia with target low density lipoprotein (LDL) cholesterol less than 100 mg/dL 04/29/2012  . Left lumbar radiculopathy 04/29/2012  . Hypogonadism male 04/29/2012  . Radiculitis of left cervical region 04/01/2012    Chrysten Woulfe Nilda Simmer PT, MPH  10/18/2018, 10:54 AM  [facility] [place], [place], [address] Phone: [phone]   Fax:  [phone]  Name: Solon Alban MRN: [id number] Date of Birth: March 04, 1971

## 2018-10-18 NOTE — Patient Instructions (Signed)
Access Code: [email or web address]  URL: [email or web address]  Date: 10/18/2018  Prepared by: Gillermo Murdoch   Exercises  Doorway Pec Stretch at 60 Degrees Abduction - 3 reps - 1 sets - 3x daily - 7x weekly  Doorway Pec Stretch at 90 Degrees Abduction - 3 reps - 1 sets - 30 seconds hold - 3x daily - 7x weekly  Doorway Pec Stretch at 120 Degrees Abduction - 3 reps - 1 sets - 30 second hold hold - 3x daily - 7x weekly  Patient Education  Trigger Point Dry Needling

## 2018-10-22 ENCOUNTER — Encounter: Payer: Self-pay | Admitting: Sports Medicine

## 2018-10-25 ENCOUNTER — Ambulatory Visit (INDEPENDENT_AMBULATORY_CARE_PROVIDER_SITE_OTHER): Payer: No Typology Code available for payment source | Admitting: Rehabilitative and Restorative Service Providers"

## 2018-10-25 ENCOUNTER — Other Ambulatory Visit: Payer: Self-pay

## 2018-10-25 ENCOUNTER — Encounter: Payer: Self-pay | Admitting: Rehabilitative and Restorative Service Providers"

## 2018-10-25 DIAGNOSIS — G8929 Other chronic pain: Secondary | ICD-10-CM

## 2018-10-25 DIAGNOSIS — R29898 Other symptoms and signs involving the musculoskeletal system: Secondary | ICD-10-CM

## 2018-10-25 DIAGNOSIS — M25512 Pain in left shoulder: Secondary | ICD-10-CM | POA: Diagnosis not present

## 2018-10-25 NOTE — Therapy (Signed)
[facility] [address] [place], [place], [address] Phone: [phone]   Fax:  [phone]  Physical Therapy Treatment  Patient Details  Name: Jerry FenderLarry Shannon MRN: [id number] Date of Birth: [birth year] Referring Provider (PT): Dr Benjamin Stainhekkekandam    Encounter Date: 10/25/2018  PT End of Session - 10/25/18 1049    Visit Number  2    Number of Visits  6    Date for PT Re-Evaluation  11/29/18    PT Start Time  1015    PT Stop Time  1041    PT Time Calculation (min)  26 min    Activity Tolerance  Patient tolerated treatment well       Past Medical History:  Diagnosis Date  . Acute bilateral knee pain 05/06/2016  . Back injury   . GERD (gastroesophageal reflux disease)   . Hip fracture, right, sequela 05/14/2016  . History of hiatal hernia     Past Surgical History:  Procedure Laterality Date  . CHONDROPLASTY Left 11/07/2014   Procedure: CHONDROPLASTY;  Surgeon: Jodi GeraldsJohn Graves, MD;  Location: [facility];  Service: Orthopedics;  Laterality: Left;  . KNEE ARTHROSCOPY WITH LATERAL MENISECTOMY Left 11/07/2014   Procedure: Knee arthroscopy with lateral menisectomy, with lateral plica;  Surgeon: Jodi GeraldsJohn Graves, MD;  Location: [facility];  Service: Orthopedics;  Laterality: Left;  . KNEE SURGERY      There were no vitals filed for this visit.  Subjective Assessment - 10/25/18 1018    Subjective  Patient reports that he feels the US helped a little bit - wants to continue with US. He has a trainer at home to help with stretching and work out    Currently in Pain?  Yes    Pain Score  2     Pain Location  Shoulder    Pain Orientation  Left    Pain Descriptors / Indicators  Dull                       [facility] Adult PT Treatment/Exercise - 10/25/18 0001      Shoulder Exercises: Stretch   Other Shoulder Stretches  biceps stretch 20 sec x 2 reps standing edge of table shd extended elbow extended  stepping forward       Ultrasound   Ultrasound Location  anterior Lt shoulder - pecs/biceps tendon area, anterior deltoid     Ultrasound Parameters  1.5 w/cm2; 100%; 1 mHz; 10 min     Ultrasound Goals  Pain;Other (Comment)   tightness      Iontophoresis   Type of Iontophoresis  Dexamethasone    Location  anterior Lt shoulder     Dose  1 mAmp/1 cc     Time  10-12 hours              PT Education - 10/25/18 1035    Education Details  ionto; HEP    Person(s) Educated  Patient    Methods  Explanation;Demonstration    Comprehension  Verbalized understanding;Returned demonstration          PT Long Term Goals - 10/18/18 1046      PT LONG TERM GOAL #1   Title  Decrease shoulder pain from 5/10 to 3-4/10 for functional tasks involved in ADL's    Time  6    Period  Weeks    Status  New    Target Date  11/29/18      PT LONG  TERM GOAL #2   Title  increase Lt shoulder strength =/> 5-/5  to 5/5 with minimal pain with resistive testing    Time  6    Period  Weeks    Status  New    Target Date  11/29/18      PT LONG TERM GOAL #3   Title  Increase AROM Lt shoulder to equal AROM Rt shoulder    Time  6    Period  Weeks    Status  New    Target Date  11/29/18      PT LONG TERM GOAL #4   Title  Independent in HEP    Time  6    Period  Weeks    Status  New    Target Date  11/29/18      PT LONG TERM GOAL #5   Title  Improve FOTO to </= 31% limitation    Time  6    Period  Weeks    Status  New    Target Date  11/29/18            Plan - 10/25/18 1049    Clinical Impression Statement  Patient reports positive response to US at initial visit. Patient continues to work with his personal trainer on stretching and strengthening. Continued with US added trial of ionto. Added biceps stretch.    Rehab Potential  Good    PT Frequency  2x / week    PT Duration  6 weeks    PT Treatment/Interventions  Patient/family education;ADLs/Self Care Home  Management;Cryotherapy;Electrical Stimulation;Iontophoresis 4mg /ml Dexamethasone;Moist Heat;Ultrasound;Manual techniques;Dry needling;Therapeutic activities;Therapeutic exercise;Neuromuscular re-education    PT Next Visit Plan  as patient directs - US; possibly DN; manual work anterior shoulder? - stretching - patient states that he is doing his stretching at home for 1 hour a day and wants to focus on US; assess response to ionto    PT Home Exercise Plan  [id number]    Consulted and Agree with Plan of Care  Patient       Patient will benefit from skilled therapeutic intervention in order to improve the following deficits and impairments:  Increased fascial restricitons, Increased muscle spasms, Pain, Impaired UE functional use, Improper body mechanics, Postural dysfunction, Decreased activity tolerance  Visit Diagnosis: 1. Other symptoms and signs involving the musculoskeletal system   2. Chronic left shoulder pain        Problem List Patient Active Problem List   Diagnosis Date Noted  . Osteopenia determined by x-ray 09/02/2018  . Insect bite 08/09/2018  . Male pattern baldness 07/26/2018  . Abrasion of skin 07/18/2018  . Seborrheic keratosis 04/18/2018  . Erythrotelangiectatic rosacea 04/18/2018  . Medial epicondylitis of both elbows 03/15/2018  . Excessive thirst 11/10/2017  . Annual physical exam 12/25/2016  . Impingement syndrome of both shoulders 10/27/2016  . Rectal bleeding 10/15/2016  . Arthropathy of hip 07/02/2016  . Seborrheic dermatitis of scalp 04/03/2016  . Alcohol abuse 03/13/2016  . Mood disorder (HCC) 11/06/2015  . History of arthroplasty of right hip 12/07/2014  . Excessive daytime sleepiness 11/06/2014  . Primary osteoarthritis of both knees 09/21/2014  . Insomnia 09/21/2014  . Erectile dysfunction 09/21/2014  . Hyperlipidemia with target low density lipoprotein (LDL) cholesterol less than 100 mg/dL 91/47/829502/21/2014  . Left lumbar radiculopathy 04/29/2012  .  Hypogonadism male 04/29/2012  . Radiculitis of left cervical region 04/01/2012    Eduard Penkala Rober MinionP Emmett Arntz PT, MPH  10/25/2018, 10:51 AM  [facility] [place], [place], [address] Phone: [phone]   Fax:  [phone]  Name: Jerry Shannon MRN: [id number] Date of Birth: 22-May-1970

## 2018-10-25 NOTE — Patient Instructions (Signed)

## 2018-10-28 ENCOUNTER — Ambulatory Visit (INDEPENDENT_AMBULATORY_CARE_PROVIDER_SITE_OTHER): Payer: No Typology Code available for payment source | Admitting: Physical Therapy

## 2018-10-28 ENCOUNTER — Other Ambulatory Visit: Payer: Self-pay

## 2018-10-28 DIAGNOSIS — G8929 Other chronic pain: Secondary | ICD-10-CM | POA: Diagnosis not present

## 2018-10-28 DIAGNOSIS — M6281 Muscle weakness (generalized): Secondary | ICD-10-CM | POA: Diagnosis not present

## 2018-10-28 DIAGNOSIS — M25512 Pain in left shoulder: Secondary | ICD-10-CM | POA: Diagnosis not present

## 2018-10-28 DIAGNOSIS — R29898 Other symptoms and signs involving the musculoskeletal system: Secondary | ICD-10-CM | POA: Diagnosis not present

## 2018-10-28 NOTE — Therapy (Signed)
[facility] [address] [place], [place], [address] Phone: [phone]   Fax:  [phone]  Physical Therapy Treatment  Patient Details  Name: Jerry Shannon MRN: [id number] Date of Birth: 1970/06/26 Referring Provider (PT): Dr Benjamin Stainhekkekandam    Encounter Date: 10/28/2018  PT End of Session - 10/28/18 0807    Visit Number  3    Number of Visits  6    Date for PT Re-Evaluation  11/29/18    PT Start Time  0804    PT Stop Time  0827    PT Time Calculation (min)  23 min    Activity Tolerance  Patient tolerated treatment well    Behavior During Therapy  [facility] for tasks assessed/performed       Past Medical History:  Diagnosis Date  . Acute bilateral knee pain 05/06/2016  . Back injury   . GERD (gastroesophageal reflux disease)   . Hip fracture, right, sequela 05/14/2016  . History of hiatal hernia     Past Surgical History:  Procedure Laterality Date  . CHONDROPLASTY Left 11/07/2014   Procedure: CHONDROPLASTY;  Surgeon: Jodi GeraldsJohn Graves, MD;  Location: [facility];  Service: Orthopedics;  Laterality: Left;  . KNEE ARTHROSCOPY WITH LATERAL MENISECTOMY Left 11/07/2014   Procedure: Knee arthroscopy with lateral menisectomy, with lateral plica;  Surgeon: Jodi GeraldsJohn Graves, MD;  Location:  S[facility]ERY [facility];  Service: Orthopedics;  Laterality: Left;  . KNEE SURGERY      There were no vitals filed for this visit.  Subjective Assessment - 10/28/18 0808    Subjective  Pt reports when he gets up in the morning, he can barely move his shoulder.  It continues to be a nagging pain.  He felt some relief with last ionto patch; he worked chest the same day.    Currently in Pain?  Yes    Pain Score  2     Pain Location  Shoulder    Pain Orientation  Left;Anterior    Pain Descriptors / Indicators  Aching;Nagging;Throbbing    Aggravating Factors   lifting gallon of milk    Pain Relieving Factors  ?         [facility]  Assessment - 10/28/18 0001      Assessment   Medical Diagnosis  Lt shoulder impingement     Referring Provider (PT)  Dr Benjamin Stainhekkekandam     Onset Date/Surgical Date  04/09/18    Hand Dominance  Right    Next MD Visit  PRN     Prior Therapy  here for hip/knee       Strength   Left Shoulder External Rotation  4/5   painful      [facility] Adult PT Treatment/Exercise - 10/28/18 0001      Exercises   Exercises  --   verbally reviewed recommended shldr stretches, ER strength     Ultrasound   Ultrasound Location  anterior Lt shoulder - pecs/biceps tendon area, anterior deltoid     Ultrasound Parameters  1.4 w/cm2, 9 min, 50%, 1 mHz     Ultrasound Goals  Pain      Iontophoresis   Type of Iontophoresis  Dexamethasone    Location  anterior Lt shoulder     Dose  1.0 cc     Time  12 hr patch, 120 mA       Manual Therapy   Manual Therapy  Soft tissue mobilization    Manual therapy comments  Pt seated with  Lt arm supported     Soft tissue mobilization  Edge tool for IASTM to Lt bicep, pec, ant/later deltoid, upper trap and levator - to decrease fascial restrictions and improve ROM.                   PT Long Term Goals - 10/18/18 1046      PT LONG TERM GOAL #1   Title  Decrease shoulder pain from 5/10 to 3-4/10 for functional tasks involved in ADL's    Time  6    Period  Weeks    Status  New    Target Date  11/29/18      PT LONG TERM GOAL #2   Title  increase Lt shoulder strength =/> 5-/5  to 5/5 with minimal pain with resistive testing    Time  6    Period  Weeks    Status  New    Target Date  11/29/18      PT LONG TERM GOAL #3   Title  Increase AROM Lt shoulder to equal AROM Rt shoulder    Time  6    Period  Weeks    Status  New    Target Date  11/29/18      PT LONG TERM GOAL #4   Title  Independent in HEP    Time  6    Period  Weeks    Status  New    Target Date  11/29/18      PT LONG TERM GOAL #5   Title  Improve FOTO to </= 31% limitation    Time  6     Period  Weeks    Status  New    Target Date  11/29/18            Plan - 10/28/18 0842    Clinical Impression Statement  Pt reported positive response to [place] and ionto last visit.  Pt continues to decline working on stretches/strengthening exercises in clinic.  Attempted to educate pt on importance of avoiding aggrivating activities to shoulder, encouraging stretches for shoulder that assist with centration of humerus in glenoid fossa. Pt continues to report that he understands and has a trainer to focus on these things.  Added trial of IASTM to treatment today.    Rehab Potential  Good    PT Frequency  2x / week    PT Duration  6 weeks    PT Treatment/Interventions  Patient/family education;ADLs/Self Care Home Management;Cryotherapy;Electrical Stimulation;Iontophoresis 4mg /ml Dexamethasone;Moist Heat;Ultrasound;Manual techniques;Dry needling;Therapeutic activities;Therapeutic exercise;Neuromuscular re-education    PT Next Visit Plan  assess response to treatment, including IASTM.    PT Home Exercise Plan  [email or web address]    Consulted and Agree with Plan of Care  Patient       Patient will benefit from skilled therapeutic intervention in order to improve the following deficits and impairments:  Increased fascial restricitons, Increased muscle spasms, Pain, Impaired UE functional use, Improper body mechanics, Postural dysfunction, Decreased activity tolerance  Visit Diagnosis: Other symptoms and signs involving the musculoskeletal system  Chronic left shoulder pain  Muscle weakness (generalized)     Problem List Patient Active Problem List   Diagnosis Date Noted  . Osteopenia determined by x-ray 09/02/2018  . Insect bite 08/09/2018  . Male pattern baldness 07/26/2018  . Abrasion of skin 07/18/2018  . Seborrheic keratosis 04/18/2018  . Erythrotelangiectatic rosacea 04/18/2018  . Medial epicondylitis of both elbows 03/15/2018  . Excessive thirst 11/10/2017  . Annual physical  exam  12/25/2016  . Impingement syndrome of both shoulders 10/27/2016  . Rectal bleeding 10/15/2016  . Arthropathy of hip 07/02/2016  . Seborrheic dermatitis of scalp 04/03/2016  . Alcohol abuse 03/13/2016  . Mood disorder (HCC) 11/06/2015  . History of arthroplasty of right hip 12/07/2014  . Excessive daytime sleepiness 11/06/2014  . Primary osteoarthritis of both knees 09/21/2014  . Insomnia 09/21/2014  . Erectile dysfunction 09/21/2014  . Hyperlipidemia with target low density lipoprotein (LDL) cholesterol less than 100 mg/dL 16/10/960402/21/2014  . Left lumbar radiculopathy 04/29/2012  . Hypogonadism male 04/29/2012  . Radiculitis of left cervical region 04/01/2012   Mayer CamelJennifer Carlson-Long, PTA 10/28/18 9:29 AM  [facility] [address] [place], [place], [address] Phone: [phone]   Fax:  [phone]  Name: Jerry Shannon MRN: [id number] Date of Birth: 05-17-1970

## 2018-10-30 ENCOUNTER — Other Ambulatory Visit: Payer: Self-pay | Admitting: Sports Medicine

## 2018-11-02 ENCOUNTER — Encounter: Payer: Self-pay | Admitting: Rehabilitative and Restorative Service Providers"

## 2018-11-02 ENCOUNTER — Other Ambulatory Visit: Payer: Self-pay

## 2018-11-02 ENCOUNTER — Ambulatory Visit (INDEPENDENT_AMBULATORY_CARE_PROVIDER_SITE_OTHER): Payer: No Typology Code available for payment source | Admitting: Rehabilitative and Restorative Service Providers"

## 2018-11-02 ENCOUNTER — Encounter: Payer: Self-pay | Admitting: Sports Medicine

## 2018-11-02 DIAGNOSIS — G8929 Other chronic pain: Secondary | ICD-10-CM | POA: Diagnosis not present

## 2018-11-02 DIAGNOSIS — M25512 Pain in left shoulder: Secondary | ICD-10-CM

## 2018-11-02 DIAGNOSIS — R29898 Other symptoms and signs involving the musculoskeletal system: Secondary | ICD-10-CM | POA: Diagnosis not present

## 2018-11-02 DIAGNOSIS — M7541 Impingement syndrome of right shoulder: Secondary | ICD-10-CM

## 2018-11-02 NOTE — Therapy (Addendum)
[address] [place], [place], [address] Phone: [phone]   Fax:  [phone]  Physical Therapy Treatment  Patient Details  Name: Jerry Shannon MRN: [id number] Date of Birth: September 19, 1970 Referring Provider (PT): Dr Dianah Field    Encounter Date: 11/02/2018  PT End of Session - 11/02/18 1000    Visit Number  4    Number of Visits  6    Date for PT Re-Evaluation  11/29/18    PT Start Time  1000    PT Stop Time  1024    PT Time Calculation (min)  24 min    Activity Tolerance  Patient tolerated treatment well       Past Medical History:  Diagnosis Date  . Acute bilateral knee pain 05/06/2016  . Back injury   . GERD (gastroesophageal reflux disease)   . Hip fracture, right, sequela 05/14/2016  . History of hiatal hernia     Past Surgical History:  Procedure Laterality Date  . CHONDROPLASTY Left 11/07/2014   Procedure: CHONDROPLASTY;  Surgeon: Dorna Leitz, MD;  Location: [facility];  Service: Orthopedics;  Laterality: Left;  . KNEE ARTHROSCOPY WITH LATERAL MENISECTOMY Left 11/07/2014   Procedure: Knee arthroscopy with lateral menisectomy, with lateral plica;  Surgeon: Dorna Leitz, MD;  Location: [facility];  Service: Orthopedics;  Laterality: Left;  . KNEE SURGERY      There were no vitals filed for this visit.  Subjective Assessment - 11/02/18 1002    Subjective  Making some progress - still can't lift weights like he wants - pain with brushing teet; taking shirt on and off; lifting a gallon of milk    Currently in Pain?  No/denies    Pain Score  0-No pain    Pain Location  Shoulder                       [facility] Adult PT Treatment/Exercise - 11/02/18 0001      Exercises   Exercises  --   HEP prolonged snow angel/ passively tight pecs/biceps      Ultrasound   Ultrasound Location  anterior Lt shoulder     Ultrasound Parameters  1.5 w/cm2; 10 min; 100%; i  mHz       Iontophoresis   Type of Iontophoresis  Dexamethasone    Location  anterior Lt shoulder     Dose  120 mAmp; 1.0 cc     Time  12 hr              PT Education - 11/02/18 1035    Education Details  HEP    Person(s) Educated  Patient    Methods  Explanation;Demonstration    Comprehension  Verbalized understanding;Returned demonstration          PT Long Term Goals - 10/18/18 1046      PT LONG TERM GOAL #1   Title  Decrease shoulder pain from 5/10 to 3-4/10 for functional tasks involved in ADL's    Time  6    Period  Weeks    Status  New    Target Date  11/29/18      PT LONG TERM GOAL #2   Title  increase Lt shoulder strength =/> 5-/5  to 5/5 with minimal pain with resistive testing    Time  6    Period  Weeks    Status  New    Target Date  11/29/18  PT LONG TERM GOAL #3   Title  Increase AROM Lt shoulder to equal AROM Rt shoulder    Time  6    Period  Weeks    Status  New    Target Date  11/29/18      PT LONG TERM GOAL #4   Title  Independent in HEP    Time  6    Period  Weeks    Status  New    Target Date  11/29/18      PT LONG TERM GOAL #5   Title  Improve FOTO to </= 31% limitation    Time  6    Period  Weeks    Status  New    Target Date  11/29/18            Plan - 11/02/18 1000    Clinical Impression Statement  Patient reports improvement in the past 3 weeks - but continues to have limitations in functional activities and weight training.    Rehab Potential  Good    PT Frequency  2x / week    PT Duration  6 weeks    PT Treatment/Interventions  Patient/family education;ADLs/Self Care Home Management;Cryotherapy;Electrical Stimulation;Iontophoresis '4mg'$ /ml Dexamethasone;Moist Heat;Ultrasound;Manual techniques;Dry needling;Therapeutic activities;Therapeutic exercise;Neuromuscular re-education    PT Next Visit Plan  continue with US/ionto    PT Home Exercise Plan  [id number]    Consulted and Agree with Plan of Care  Patient        Patient will benefit from skilled therapeutic intervention in order to improve the following deficits and impairments:  Increased fascial restricitons, Increased muscle spasms, Pain, Impaired UE functional use, Improper body mechanics, Postural dysfunction, Decreased activity tolerance  Visit Diagnosis: Other symptoms and signs involving the musculoskeletal system  Chronic left shoulder pain     Problem List Patient Active Problem List   Diagnosis Date Noted  . Osteopenia determined by x-ray 09/02/2018  . Insect bite 08/09/2018  . Male pattern baldness 07/26/2018  . Abrasion of skin 07/18/2018  . Seborrheic keratosis 04/18/2018  . Erythrotelangiectatic rosacea 04/18/2018  . Medial epicondylitis of both elbows 03/15/2018  . Excessive thirst 11/10/2017  . Annual physical exam 12/25/2016  . Impingement syndrome of both shoulders 10/27/2016  . Rectal bleeding 10/15/2016  . Arthropathy of hip 07/02/2016  . Seborrheic dermatitis of scalp 04/03/2016  . Alcohol abuse 03/13/2016  . Mood disorder ([place]) 11/06/2015  . History of arthroplasty of right hip 12/07/2014  . Excessive daytime sleepiness 11/06/2014  . Primary osteoarthritis of both knees 09/21/2014  . Insomnia 09/21/2014  . Erectile dysfunction 09/21/2014  . Hyperlipidemia with target low density lipoprotein (LDL) cholesterol less than 100 mg/dL 04/29/2012  . Left lumbar radiculopathy 04/29/2012  . Hypogonadism male 04/29/2012  . Radiculitis of left cervical region 04/01/2012     Nilda Simmer PT, MPH  11/02/2018, 10:42 AM  [facility] [address] [place], [place], [address] Phone: [phone]   Fax:  [phone]  Name: Jerry Shannon MRN: [id number] Date of Birth: 1971/02/11  PHYSICAL THERAPY DISCHARGE SUMMARY  Visits from Start of Care: 4  Current functional level related to goals / functional outcomes: Prather was seen doe 4 visits with treatment consisting  of Ultrasound at his request. He was resistant to any other suggestions for shoulder management or exercises.    Remaining deficits: Unknown    Education / Equipment: HEP  Plan: Patient agrees to discharge.  Patient goals were not met. Patient is being discharged  due to not returning since the last visit.  ?????     P. Helene Kelp PT, MPH 11/25/18 2:37 PM

## 2018-11-02 NOTE — Patient Instructions (Signed)
Prolonged snow angel UE's supported ~ 2 min

## 2018-11-07 ENCOUNTER — Other Ambulatory Visit: Payer: Self-pay

## 2018-11-07 ENCOUNTER — Ambulatory Visit (INDEPENDENT_AMBULATORY_CARE_PROVIDER_SITE_OTHER): Payer: No Typology Code available for payment source | Admitting: Sports Medicine

## 2018-11-07 ENCOUNTER — Encounter: Payer: Self-pay | Admitting: Sports Medicine

## 2018-11-07 DIAGNOSIS — M25512 Pain in left shoulder: Secondary | ICD-10-CM

## 2018-11-07 DIAGNOSIS — L709 Acne, unspecified: Secondary | ICD-10-CM

## 2018-11-07 MED ORDER — CLINDAMYCIN PHOS-BENZOYL PEROX 1.2-5 % EX GEL: 1.0000 "application " | Freq: Two times a day (BID) | CUTANEOUS | 11 refills | Status: DC

## 2018-11-07 NOTE — Assessment & Plan Note (Signed)
Patient tells me he is not using any testosterone supplementation. Adding topical BenzaClin to be used twice a day.

## 2018-11-07 NOTE — Progress Notes (Signed)
Subjective:    CC: Several issues  HPI: Left shoulder pain: We been treating this now for some time, we have injected it, he only had minimal relief.  MRI shows tendinosis without tear.  He desires repeat interventional treatment today, it has been almost 3 months since his last shot.  Acne: Localized over the chest, patient assures me that he is not using any exogenous testosterone.  I reviewed the past medical history, family history, social history, surgical history, and allergies today and no changes were needed.  Please see the problem list section below in epic for further details.  Past Medical History: Past Medical History:  Diagnosis Date  . Acute bilateral knee pain 05/06/2016  . Back injury   . GERD (gastroesophageal reflux disease)   . Hip fracture, right, sequela 05/14/2016  . History of hiatal hernia    Past Surgical History: Past Surgical History:  Procedure Laterality Date  . CHONDROPLASTY Left 11/07/2014   Procedure: CHONDROPLASTY;  Surgeon: Jodi GeraldsJohn Graves, MD;  Location: [facility];  Service: Orthopedics;  Laterality: Left;  . KNEE ARTHROSCOPY WITH LATERAL MENISECTOMY Left 11/07/2014   Procedure: Knee arthroscopy with lateral menisectomy, with lateral plica;  Surgeon: Jodi GeraldsJohn Graves, MD;  Location: [facility];  Service: Orthopedics;  Laterality: Left;  . KNEE SURGERY     Social History: Social History   Socioeconomic History  . Marital status: Single    Spouse name: Not on file  . Number of children: Not on file  . Years of education: Not on file  . Highest education level: Not on file  Occupational History  . Not on file  Social Needs  . Financial resource strain: Not on file  . Food insecurity    Worry: Not on file    Inability: Not on file  . Transportation needs    Medical: Not on file    Non-medical: Not on file  Tobacco Use  . Smoking status: Never Smoker  . Smokeless tobacco: Current User    Types: Chew  Substance  and Sexual Activity  . Alcohol use: No    Comment: Quit 7 weeks ago  . Drug use: No  . Sexual activity: Yes    Partners: Female  Lifestyle  . Physical activity    Days per week: Not on file    Minutes per session: Not on file  . Stress: Not on file  Relationships  . Social Musicianconnections    Talks on phone: Not on file    Gets together: Not on file    Attends religious service: Not on file    Active member of club or organization: Not on file    Attends meetings of clubs or organizations: Not on file    Relationship status: Not on file  Other Topics Concern  . Not on file  Social History Narrative  . Not on file   Family History: No family history on file. Allergies: No Known Allergies Medications: See med rec.  Review of Systems: No fevers, chills, night sweats, weight loss, chest pain, or shortness of breath.   Objective:    General: Well Developed, well nourished, and in no acute distress.  Neuro: Alert and oriented x3, extra-ocular muscles intact, sensation grossly intact.  HEENT: Normocephalic, atraumatic, pupils equal round reactive to light, neck supple, no masses, no lymphadenopathy, thyroid nonpalpable.  Skin: Warm and dry, multiple erythematous pustules on the chest. Cardiac: Regular rate and rhythm, no murmurs rubs or gallops, no lower extremity edema.  Respiratory: Clear to auscultation bilaterally. Not using accessory muscles, speaking in full sentences. Left shoulder: Inspection reveals no abnormalities, atrophy or asymmetry. Palpation is normal with no tenderness over AC joint or bicipital groove. ROM is full in all directions Rotator cuff strength weak throughout. Positive Neer and Hawkin's tests, empty can. Speeds and Yergason's tests normal. No labral pathology noted with negative Obrien's, negative crank, negative clunk, and good stability. Normal scapular function observed. No painful arc and no drop arm sign. No apprehension sign  Procedure:  Real-time Ultrasound Guided injection of the left subacromial bursa Device: GE Logiq E  Verbal informed consent obtained.  Time-out conducted.  Noted no overlying erythema, induration, or other signs of local infection.  Skin prepped in a sterile fashion.  Local anesthesia: Topical Ethyl chloride.  With sterile technique and under real time ultrasound guidance:  1 cc Kenalog 40, 1 cc lidocaine, 1 cc bupivacaine injected easily Completed without difficulty  Pain immediately resolved suggesting accurate placement of the medication.  Advised to call if fevers/chills, erythema, induration, drainage, or persistent bleeding.  Images permanently stored and available for review in the ultrasound unit.  Impression: Technically successful ultrasound guided injection.  Impression and Recommendations:    Adult acne Patient tells me he is not using any testosterone supplementation. Adding topical BenzaClin to be used twice a day.   ___________________________________________ Gwen Her. Dianah Field, M.D., ABFM., CAQSM. Primary Care and Sports Medicine [facility]  Adjunct Professor of [employer] [facility] [employer] [facility]

## 2018-11-18 ENCOUNTER — Encounter: Payer: Self-pay | Admitting: Sports Medicine

## 2018-11-25 ENCOUNTER — Encounter: Payer: Self-pay | Admitting: Sports Medicine

## 2018-11-27 ENCOUNTER — Other Ambulatory Visit: Payer: Self-pay | Admitting: Sports Medicine

## 2018-11-28 ENCOUNTER — Encounter: Payer: Self-pay | Admitting: Sports Medicine

## 2018-12-06 ENCOUNTER — Encounter: Payer: Self-pay | Admitting: Sports Medicine

## 2018-12-19 ENCOUNTER — Ambulatory Visit (INDEPENDENT_AMBULATORY_CARE_PROVIDER_SITE_OTHER): Payer: No Typology Code available for payment source | Admitting: Sports Medicine

## 2018-12-19 ENCOUNTER — Encounter: Payer: Self-pay | Admitting: Sports Medicine

## 2018-12-19 ENCOUNTER — Other Ambulatory Visit: Payer: Self-pay | Admitting: Sports Medicine

## 2018-12-19 ENCOUNTER — Other Ambulatory Visit: Payer: Self-pay

## 2018-12-19 DIAGNOSIS — M7541 Impingement syndrome of right shoulder: Secondary | ICD-10-CM | POA: Diagnosis not present

## 2018-12-19 DIAGNOSIS — M7542 Impingement syndrome of left shoulder: Secondary | ICD-10-CM

## 2018-12-19 DIAGNOSIS — E785 Hyperlipidemia, unspecified: Secondary | ICD-10-CM

## 2018-12-19 MED ORDER — PRAVASTATIN SODIUM 20 MG PO TABS: 20.0000 mg | ORAL_TABLET | Freq: Every day | ORAL | 3 refills | Status: DC

## 2018-12-19 NOTE — Patient Instructions (Signed)
 Platelet-rich plasma is used in musculoskeletal medicine to focus your own body's ability to heal. It has several well-done published randomized control trials (RCT) which demonstrate both its effectiveness and safety in many musculoskeletal conditions, including osteoarthritis, tendinopathies, and damaged vertebral discs. PRP has been in clinical use since the 1990's. Many people know that platelets form a clot if there is a cut in the skin. It turns out that platelets do not only form a clot, they also start the body's own repair process. When platelets activate to form a clot, they also release alpha granules which have hundreds of chemical messengers in them that initiate and organize repair to the damaged tissue. Precisely placing PRP into the site of injury will initiate the healing process by activating on the damaged cartilage or tendon. This is an inflammatory process, and inflammation is the vital first phase of Healing.  What to expect and how to prepare for PRP  . 2 weeks prior to the procedure: depending on the procedure, you may need to arrange for a driver to bring you home. IF you are having a lower extremity procedure, we can provide crutches as needed.  . 7 days prior to the procedure: Stop taking anti-inflammatory drugs like ibuprofen, Naprosyn, Celebrex, or Meloxicam. Let your doctor know if you have been taking prednisone or other corticosteroids in the last month.  . The day before the procedure: thoroughly shower and clean your skin.   . The day of the procedure: Wear loose-fitting clothing like sweatpants or shorts. If you are having an upper body procedure wear a top that can button or zip up.  PRP will initiate healing and a productive inflammation, and PRP therapy will make the body part treated sore for 4 days to two weeks. Anti-inflammatory drugs (i.e. ibuprofen, Naprosyn, Celebrex) and corticosteroids such as prednisone can blunt or stop this process, so  it is important to not take any anti-inflammatory drugs for 7 days before getting PRP therapy, or for at least three weeks after PRP therapy. Corticosteroid injections can blunt inflammation for 30 days, so let us know if you have had one recently. Depending on the body part injected, you may be in a sling or on crutches for several days. Just like wringing out a wet dishcloth, if you load or tense a tendon or ligament that has just been injected with PRP, some of the PRP injected will squish out. By keeping the body part treated relaxed by using a sling (for the shoulder or arm) or crutches (for hips and legs) for a few days, the PRP can bind in place and do its job.   You may need a driver to bring you home.  Tobacco?nicotine is a potent toxin and its use constricts small blood vessels which are needed for tissue repair.  Tobacco/nicotine use will limit the effectiveness of any treatment and stopping tobacco use is one of the single  greatest actions you can take to improve your health. Avoid toxins like alcohol, which inhibits and depresses the cells needed for tissue repair.  What happens during the PRP procedure?  Platelet rich plasma is made by taking some of your blood and performing a two-stage centrifuge process on it to concentrate the PRP. First, your blood is drawn into a syringe with a small amount of anti-coagulant in it (this is to keep the blood from clotting during this process). The amount of blood drawn is usually about 30-60 milliliters, depending on how much PRP is needed for   the treatment.  (There are 355 milliliters in a 12-ounce soda can for comparison).  Then the blood is transferred in a sterile fashion into a centrifuge tube. It is then centrifuged for the first cycle where the red blood cells are isolated and discarded. In the second centrifuge cycle, the platelet-rich fraction of the remaining plasma is concentrated and placed in a syringe. The skin at the  injection site is numbed with a small amount of topical cooling spray. Dr Alba Perillo will then precisely inject the PRP into the injury site using ultrasound guidance.  What to do after your procedure  I will give you specific medicine to control any discomfort you may have after the procedure. Avoid NSAIDs like ibuprofen. Acetaminophen can be used for mild pain.  Depending on the part of the body treated, usually you will be placed in a sling or on crutches for 1 to 3 days. Do your best not to tense or load the treated area during this time. After 3 days, unless otherwise instructed, the treated body part should be used and slowly moved through its full range of motion. It will be sore, but you will not be doing damage by moving it, in fact it needs to move to heal. If you were on crutches for a period of time, walking is ok once you are off the crutches. For now, avoid activities that specifically hurt you before being treated. Exercise is vital to good health and finding a way to cross train around your injury is important not only for your physical health, but for your mental health as well. Ask me about cross training options for your injury. Some brief (10 minutes or less) period of heat or ice therapy will not hurt the therapy, but it is not required. Usually, depending on the initial injury, physical therapy is started from two weeks to four weeks after injection. Improvements in pain and function should be expected from 8 weeks to 12 weeks after injection and some injuries may require more than one treatment.    ___________________________________________ Jobanny Mavis J. Shyenne Maggard, M.D., ABFM., CAQSM. Primary Care and Sports Medicine [facility]  Adjunct Professor of Family Medicine  [employer]   

## 2018-12-19 NOTE — Assessment & Plan Note (Signed)
Switching to pravastatin per patient request, recheck lipids in 3 months.

## 2018-12-19 NOTE — Assessment & Plan Note (Signed)
Right shoulder continues to do well, left side continues to hurt, he does have supraspinatus and infraspinatus tendinopathy without tear on MRI. He has failed 2 steroid injections with ultrasound guidance. I would like him to touch base with Dr. Griffin Basil to discuss a surgical option, we could certainly try a PRP injection into the subacromial bursa, he will discuss this with Dr. Griffin Basil. If he does elect to proceed with PRP he will schedule a 30-minute slot.  He was informed of the lack of high-quality randomized controlled studies showing efficacy of PRP into the subacromial bursa.

## 2018-12-19 NOTE — Progress Notes (Signed)
Subjective:    CC: L shoulder pain  HPI: Jerry Shannon is a 48 yo male presenting for follow up on L shoulder impingement. He has supraspinatus and infraspinatus tendinopathy without tear on MRI. He has previously had 2 steroid injections in the shoulder which have only provided a maximum of 2 weeks of relief. He has not refrained from strenuous physical exercise for longer than 2 weeks. He reports no longer being able to perform overhead or lateral weighted lifts. He reports localized pain over the anterior shoulder joint and proximal humerus. Denies radiation to the hands.  I reviewed the past medical history, family history, social history, surgical history, and allergies today and no changes were needed.  Please see the problem list section below in epic for further details.  Past Medical History: Past Medical History:  Diagnosis Date  . Acute bilateral knee pain 05/06/2016  . Back injury   . GERD (gastroesophageal reflux disease)   . Hip fracture, right, sequela 05/14/2016  . History of hiatal hernia    Past Surgical History: Past Surgical History:  Procedure Laterality Date  . CHONDROPLASTY Left 11/07/2014   Procedure: CHONDROPLASTY;  Surgeon: Jodi Geralds, MD;  Location: [facility];  Service: Orthopedics;  Laterality: Left;  . KNEE ARTHROSCOPY WITH LATERAL MENISECTOMY Left 11/07/2014   Procedure: Knee arthroscopy with lateral menisectomy, with lateral plica;  Surgeon: Jodi Geralds, MD;  Location: [facility];  Service: Orthopedics;  Laterality: Left;  . KNEE SURGERY     Social History: Social History   Socioeconomic History  . Marital status: Single    Spouse name: Not on file  . Number of children: Not on file  . Years of education: Not on file  . Highest education level: Not on file  Occupational History  . Not on file  Social Needs  . Financial resource strain: Not on file  . Food insecurity    Worry: Not on file    Inability: Not on file  .  Transportation needs    Medical: Not on file    Non-medical: Not on file  Tobacco Use  . Smoking status: Never Smoker  . Smokeless tobacco: Current User    Types: Chew  Substance and Sexual Activity  . Alcohol use: No    Comment: Quit 7 weeks ago  . Drug use: No  . Sexual activity: Yes    Partners: Female  Lifestyle  . Physical activity    Days per week: Not on file    Minutes per session: Not on file  . Stress: Not on file  Relationships  . Social Musician on phone: Not on file    Gets together: Not on file    Attends religious service: Not on file    Active member of club or organization: Not on file    Attends meetings of clubs or organizations: Not on file    Relationship status: Not on file  Other Topics Concern  . Not on file  Social History Narrative  . Not on file   Family History: No family history on file. Allergies: No Known Allergies Medications: See med rec.  Review of Systems: No fevers, chills, night sweats, weight loss, chest pain, or shortness of breath.   Objective:    General: Well Developed, well nourished, and in no acute distress.  Neuro: Alert and oriented x3, extra-ocular muscles intact, sensation grossly intact.  HEENT: Normocephalic, atraumatic.  Skin: Warm and dry, no rashes. Cardiac: Regular rate  and rhythm, no lower extremity edema.  Respiratory: Not using accessory muscles, speaking in full sentences.  Shoulder: Inspection reveals no abnormalities, atrophy or asymmetry. Tenderness to palpation over bicipital groove. ROM is full in all planes. Rotator cuff strength limited on internal rotaiton. Signs of impingement with positive Neer and Hawkin's tests, empty can sign. Positive speeds test. No labral pathology noted with negative Obrien's, negative clunk and good stability. Unable to reach opposite scapula with L arm overhead. No drop arm sign.  A/P: Despite his lack of tear on MRI, due to the prolonged duration of  his symptoms and multiple failed injections of his L shoulder, Jerry Shannon is a candidate for referral to an orthopedic surgeon for evaluation. If determined to not be a good candidate for surgery the option of PRP was discussed to manage his symptoms.   Impression and Recommendations:    No problem-specific Assessment & Plan notes found for this encounter.   ___________________________________________ Gwen Her. Dianah Field, M.D., ABFM., CAQSM. Primary Care and Sports Medicine [facility]  Adjunct Professor of [employer]

## 2018-12-23 ENCOUNTER — Encounter: Payer: Self-pay | Admitting: Sports Medicine

## 2018-12-25 ENCOUNTER — Other Ambulatory Visit: Payer: Self-pay | Admitting: Sports Medicine

## 2018-12-25 DIAGNOSIS — M5416 Radiculopathy, lumbar region: Secondary | ICD-10-CM

## 2018-12-26 ENCOUNTER — Encounter: Payer: Self-pay | Admitting: Sports Medicine

## 2018-12-27 MED ORDER — GABAPENTIN 600 MG PO TABS: ORAL_TABLET | ORAL | 11 refills | Status: DC

## 2018-12-28 ENCOUNTER — Encounter: Payer: Self-pay | Admitting: Sports Medicine

## 2018-12-29 ENCOUNTER — Encounter: Payer: Self-pay | Admitting: Sports Medicine

## 2019-01-02 ENCOUNTER — Other Ambulatory Visit: Payer: Self-pay

## 2019-01-02 ENCOUNTER — Ambulatory Visit (INDEPENDENT_AMBULATORY_CARE_PROVIDER_SITE_OTHER): Payer: Self-pay | Admitting: Sports Medicine

## 2019-01-02 ENCOUNTER — Encounter: Payer: Self-pay | Admitting: Sports Medicine

## 2019-01-02 ENCOUNTER — Other Ambulatory Visit: Payer: Self-pay | Admitting: Sports Medicine

## 2019-01-02 DIAGNOSIS — M7541 Impingement syndrome of right shoulder: Secondary | ICD-10-CM

## 2019-01-02 DIAGNOSIS — M7542 Impingement syndrome of left shoulder: Secondary | ICD-10-CM

## 2019-01-02 MED ORDER — OXYCODONE-ACETAMINOPHEN 10-325 MG PO TABS: 1.0000 | ORAL_TABLET | Freq: Three times a day (TID) | ORAL | 0 refills | Status: DC | PRN

## 2019-01-02 NOTE — Assessment & Plan Note (Signed)
Persistent discomfort associated with a left supraspinatus and infraspinatus tendinopathy. After failure of months of rehabilitation, steroid injections, activity modification we referred him to Dr. Griffin Basil, there is not an immediate surgical option. Today we performed a PRP percutaneous tenotomy and subacromial injection into the left subacromial bursa and the left supraspinatus. He will wear a sling for a week, we will start gentle protected range of motion afterwards for 3 weeks with physical therapy followed by 3 weeks of active range of motion and then 3 weeks of strengthening. Percocet for postop pain.

## 2019-01-02 NOTE — Progress Notes (Signed)
   Procedure: Real-time Ultrasound Guided Platelet Rich Plasma (PRP) Injection of left subacromial bursa and rotator cuff Device: GE Logiq E  Verbal informed consent obtained.  Time-out conducted.  Noted no overlying erythema, induration, or other signs of local infection.  Obtained 30 cc of blood from peripheral vein, using the "PEAK" centrifuge, red blood cells were separated from the plasma. Subsequently red blood cells were drained leaving only plasma with the buffy coat layer between the desired lines. Platelet poor plasma was then centrifuged out, and remaining platelet rich plasma aspirated into a 5 cc syringe.  Skin prepped in a sterile fashion.  Local anesthesia: Topical Ethyl chloride.  With sterile technique and under real time ultrasound guidance the platelet rich plasma (PRP) obtained above: Using a 22-gauge needle I injected medication into the subcutaneous tissues, through the deltoid and subacromial bursa.  Syringe switched and I then peppered the supraspinatus as well as the superior infraspinatus while injecting PRP, we then placed additional PRP in the subacromial bursa. Completed without difficulty  Advised to call if fevers/chills, erythema, induration, drainage, or persistent bleeding.  Images permanently stored and available for review in the ultrasound unit.  Impression: Technically successful ultrasound guided Platelet Rich Plasma (PRP) injection.  ________________________________________________________________________________________  Impingement syndrome of both shoulders Persistent discomfort associated with a left supraspinatus and infraspinatus tendinopathy. After failure of months of rehabilitation, steroid injections, activity modification we referred him to Dr. Griffin Basil, there is not an immediate surgical option. Today we performed a PRP percutaneous tenotomy and subacromial injection into the left subacromial bursa and the left supraspinatus. He will wear a sling  for a week, we will start gentle protected range of motion afterwards for 3 weeks with physical therapy followed by 3 weeks of active range of motion and then 3 weeks of strengthening. Percocet for postop pain.   ___________________________________________ Jerry Shannon. Dianah Field, M.D., ABFM., CAQSM. Primary Care and Sports Medicine [facility]  Adjunct Professor of [employer]

## 2019-01-02 NOTE — Patient Instructions (Signed)
 Platelet-rich plasma is used in musculoskeletal medicine to focus your own body's ability to heal. It has several well-done published randomized control trials (RCT) which demonstrate both its effectiveness and safety in many musculoskeletal conditions, including osteoarthritis, tendinopathies, and damaged vertebral discs. PRP has been in clinical use since the 1990's. Many people know that platelets form a clot if there is a cut in the skin. It turns out that platelets do not only form a clot, they also start the body's own repair process. When platelets activate to form a clot, they also release alpha granules which have hundreds of chemical messengers in them that initiate and organize repair to the damaged tissue. Precisely placing PRP into the site of injury will initiate the healing process by activating on the damaged cartilage or tendon. This is an inflammatory process, and inflammation is the vital first phase of Healing.  What to expect and how to prepare for PRP  . 2 weeks prior to the procedure: depending on the procedure, you may need to arrange for a driver to bring you home. IF you are having a lower extremity procedure, we can provide crutches as needed.  . 7 days prior to the procedure: Stop taking anti-inflammatory drugs like ibuprofen, Naprosyn, Celebrex, or Meloxicam. Let your doctor know if you have been taking prednisone or other corticosteroids in the last month.  . The day before the procedure: thoroughly shower and clean your skin.   . The day of the procedure: Wear loose-fitting clothing like sweatpants or shorts. If you are having an upper body procedure wear a top that can button or zip up.  PRP will initiate healing and a productive inflammation, and PRP therapy will make the body part treated sore for 4 days to two weeks. Anti-inflammatory drugs (i.e. ibuprofen, Naprosyn, Celebrex) and corticosteroids such as prednisone can blunt or stop this process, so  it is important to not take any anti-inflammatory drugs for 7 days before getting PRP therapy, or for at least three weeks after PRP therapy. Corticosteroid injections can blunt inflammation for 30 days, so let us know if you have had one recently. Depending on the body part injected, you may be in a sling or on crutches for several days. Just like wringing out a wet dishcloth, if you load or tense a tendon or ligament that has just been injected with PRP, some of the PRP injected will squish out. By keeping the body part treated relaxed by using a sling (for the shoulder or arm) or crutches (for hips and legs) for a few days, the PRP can bind in place and do its job.   You may need a driver to bring you home.  Tobacco?nicotine is a potent toxin and its use constricts small blood vessels which are needed for tissue repair.  Tobacco/nicotine use will limit the effectiveness of any treatment and stopping tobacco use is one of the single  greatest actions you can take to improve your health. Avoid toxins like alcohol, which inhibits and depresses the cells needed for tissue repair.  What happens during the PRP procedure?  Platelet rich plasma is made by taking some of your blood and performing a two-stage centrifuge process on it to concentrate the PRP. First, your blood is drawn into a syringe with a small amount of anti-coagulant in it (this is to keep the blood from clotting during this process). The amount of blood drawn is usually about 30-60 milliliters, depending on how much PRP is needed for   the treatment.  (There are 355 milliliters in a 12-ounce soda can for comparison).  Then the blood is transferred in a sterile fashion into a centrifuge tube. It is then centrifuged for the first cycle where the red blood cells are isolated and discarded. In the second centrifuge cycle, the platelet-rich fraction of the remaining plasma is concentrated and placed in a syringe. The skin at the  injection site is numbed with a small amount of topical cooling spray. Dr Yaxiel Minnie will then precisely inject the PRP into the injury site using ultrasound guidance.  What to do after your procedure  I will give you specific medicine to control any discomfort you may have after the procedure. Avoid NSAIDs like ibuprofen. Acetaminophen can be used for mild pain.  Depending on the part of the body treated, usually you will be placed in a sling or on crutches for 1 to 3 days. Do your best not to tense or load the treated area during this time. After 3 days, unless otherwise instructed, the treated body part should be used and slowly moved through its full range of motion. It will be sore, but you will not be doing damage by moving it, in fact it needs to move to heal. If you were on crutches for a period of time, walking is ok once you are off the crutches. For now, avoid activities that specifically hurt you before being treated. Exercise is vital to good health and finding a way to cross train around your injury is important not only for your physical health, but for your mental health as well. Ask me about cross training options for your injury. Some brief (10 minutes or less) period of heat or ice therapy will not hurt the therapy, but it is not required. Usually, depending on the initial injury, physical therapy is started from two weeks to four weeks after injection. Improvements in pain and function should be expected from 8 weeks to 12 weeks after injection and some injuries may require more than one treatment.    ___________________________________________ Jermichael Belmares J. Linard Daft, M.D., ABFM., CAQSM. Primary Care and Sports Medicine [facility]  Adjunct Professor of Family Medicine  [employer]   

## 2019-01-04 ENCOUNTER — Encounter: Payer: Self-pay | Admitting: Sports Medicine

## 2019-01-06 ENCOUNTER — Encounter: Payer: Self-pay | Admitting: Sports Medicine

## 2019-01-06 DIAGNOSIS — M7542 Impingement syndrome of left shoulder: Secondary | ICD-10-CM

## 2019-01-06 DIAGNOSIS — M7541 Impingement syndrome of right shoulder: Secondary | ICD-10-CM

## 2019-01-06 MED ORDER — OXYCODONE-ACETAMINOPHEN 10-325 MG PO TABS: 1.0000 | ORAL_TABLET | Freq: Three times a day (TID) | ORAL | 0 refills | Status: DC | PRN

## 2019-01-08 ENCOUNTER — Encounter: Payer: Self-pay | Admitting: Sports Medicine

## 2019-01-18 ENCOUNTER — Encounter: Payer: Self-pay | Admitting: Sports Medicine

## 2019-01-19 ENCOUNTER — Encounter: Payer: Self-pay | Admitting: Sports Medicine

## 2019-01-30 ENCOUNTER — Ambulatory Visit: Payer: No Typology Code available for payment source | Admitting: Sports Medicine

## 2019-01-31 ENCOUNTER — Ambulatory Visit (INDEPENDENT_AMBULATORY_CARE_PROVIDER_SITE_OTHER): Payer: No Typology Code available for payment source | Admitting: Sports Medicine

## 2019-01-31 ENCOUNTER — Other Ambulatory Visit: Payer: Self-pay

## 2019-01-31 ENCOUNTER — Encounter: Payer: Self-pay | Admitting: Sports Medicine

## 2019-01-31 DIAGNOSIS — M7541 Impingement syndrome of right shoulder: Secondary | ICD-10-CM

## 2019-01-31 DIAGNOSIS — M7542 Impingement syndrome of left shoulder: Secondary | ICD-10-CM

## 2019-01-31 DIAGNOSIS — E291 Testicular hypofunction: Secondary | ICD-10-CM

## 2019-01-31 MED ORDER — ANASTROZOLE 1 MG PO TABS: 1.0000 mg | ORAL_TABLET | Freq: Every day | ORAL | 3 refills | Status: DC

## 2019-01-31 NOTE — Assessment & Plan Note (Signed)
Continue testosterone, he has a family member on testosterone and also taking an aromatase inhibitor, this is likely Arimidex but he will call to find out the dose and frequency and I am happy to call it in. This will help to increase the active testosterone.

## 2019-01-31 NOTE — Assessment & Plan Note (Signed)
Left supraspinatus and infraspinatus tendinopathy, after failure of months of rehabilitation, steroid injections and activity modification we proceeded with PRP percutaneous tenotomy and subacromial injection into the left subacromial bursa and left supraspinatus. 0 pain at rest, he does have significant discomfort with abduction past about 70 degrees. He continues to improve, he understands that 8 to 12 weeks of recovery is not uncommon after a procedure like this. He will continue with PT, try to avoid overhead activities, and if he continues to have discomfort at the 12-week point I would like Dr. Griffin Basil to consider subacromial decompression.

## 2019-01-31 NOTE — Progress Notes (Signed)
Subjective:    CC: Follow-up  HPI: Jerry Shannon returns, we did a PRP injection and percutaneous tenotomy into his supraspinatus and subacromial bursa 4 weeks ago, he is improving slowly.  I reviewed the past medical history, family history, social history, surgical history, and allergies today and no changes were needed.  Please see the problem list section below in epic for further details.  Past Medical History: Past Medical History:  Diagnosis Date  . Acute bilateral knee pain 05/06/2016  . Back injury   . GERD (gastroesophageal reflux disease)   . Hip fracture, right, sequela 05/14/2016  . History of hiatal hernia    Past Surgical History: Past Surgical History:  Procedure Laterality Date  . CHONDROPLASTY Left 11/07/2014   Procedure: CHONDROPLASTY;  Surgeon: Dorna Leitz, MD;  Location: [place];  Service: Orthopedics;  Laterality: Left;  . KNEE ARTHROSCOPY WITH LATERAL MENISECTOMY Left 11/07/2014   Procedure: Knee arthroscopy with lateral menisectomy, with lateral plica;  Surgeon: Dorna Leitz, MD;  Location: [place];  Service: Orthopedics;  Laterality: Left;  . KNEE SURGERY     Social History: Social History   Socioeconomic History  . Marital status: Single    Spouse name: Not on file  . Number of children: Not on file  . Years of education: Not on file  . Highest education level: Not on file  Occupational History  . Not on file  Social Needs  . Financial resource strain: Not on file  . Food insecurity    Worry: Not on file    Inability: Not on file  . Transportation needs    Medical: Not on file    Non-medical: Not on file  Tobacco Use  . Smoking status: Never Smoker  . Smokeless tobacco: Current User    Types: Chew  Substance and Sexual Activity  . Alcohol use: No    Comment: Quit 7 weeks ago  . Drug use: No  . Sexual activity: Yes    Partners: Female  Lifestyle  . Physical activity    Days per week: Not on file    Minutes  per session: Not on file  . Stress: Not on file  Relationships  . Social Herbalist on phone: Not on file    Gets together: Not on file    Attends religious service: Not on file    Active member of club or organization: Not on file    Attends meetings of clubs or organizations: Not on file    Relationship status: Not on file  Other Topics Concern  . Not on file  Social History Narrative  . Not on file   Family History: No family history on file. Allergies: No Known Allergies Medications: See med rec.  Review of Systems: No fevers, chills, night sweats, weight loss, chest pain, or shortness of breath.   Objective:    General: Well Developed, well nourished, and in no acute distress.  Neuro: Alert and oriented x3, extra-ocular muscles intact, sensation grossly intact.  HEENT: Normocephalic, atraumatic, pupils equal round reactive to light, neck supple, no masses, no lymphadenopathy, thyroid nonpalpable.  Skin: Warm and dry, no rashes. Cardiac: Regular rate and rhythm, no murmurs rubs or gallops, no lower extremity edema.  Respiratory: Clear to auscultation bilaterally. Not using accessory muscles, speaking in full sentences. Left shoulder: Somewhat weak to abduction, good motion and good strength to all other directions.  Impression and Recommendations:    Impingement syndrome of both shoulders Left  supraspinatus and infraspinatus tendinopathy, after failure of months of rehabilitation, steroid injections and activity modification we proceeded with PRP percutaneous tenotomy and subacromial injection into the left subacromial bursa and left supraspinatus. 0 pain at rest, he does have significant discomfort with abduction past about 70 degrees. He continues to improve, he understands that 8 to 12 weeks of recovery is not uncommon after a procedure like this. He will continue with PT, try to avoid overhead activities, and if he continues to have discomfort at the 12-week  point I would like Dr. Everardo Pacific to consider subacromial decompression.  Hypogonadism male Continue testosterone, he has a family member on testosterone and also taking an aromatase inhibitor, this is likely Arimidex but he will call to find out the dose and frequency and I am happy to call it in. This will help to increase the active testosterone.   ___________________________________________ Jerry Shannon. Jerry Shannon, M.D., ABFM., CAQSM. Primary Care and Sports Medicine [facility]  Adjunct Professor of Family Medicine  [employer]

## 2019-02-01 ENCOUNTER — Encounter: Payer: Self-pay | Admitting: Sports Medicine

## 2019-02-02 ENCOUNTER — Encounter: Payer: Self-pay | Admitting: Sports Medicine

## 2019-02-05 ENCOUNTER — Other Ambulatory Visit: Payer: Self-pay | Admitting: Sports Medicine

## 2019-02-16 ENCOUNTER — Other Ambulatory Visit: Payer: Self-pay | Admitting: Sports Medicine

## 2019-02-16 DIAGNOSIS — M5416 Radiculopathy, lumbar region: Secondary | ICD-10-CM

## 2019-03-06 ENCOUNTER — Other Ambulatory Visit: Payer: Self-pay | Admitting: Sports Medicine

## 2019-04-04 ENCOUNTER — Other Ambulatory Visit: Payer: Self-pay | Admitting: Sports Medicine

## 2019-04-04 ENCOUNTER — Other Ambulatory Visit: Payer: Self-pay

## 2019-04-04 ENCOUNTER — Ambulatory Visit (INDEPENDENT_AMBULATORY_CARE_PROVIDER_SITE_OTHER): Payer: No Typology Code available for payment source | Admitting: Sports Medicine

## 2019-04-04 DIAGNOSIS — M7541 Impingement syndrome of right shoulder: Secondary | ICD-10-CM

## 2019-04-04 DIAGNOSIS — L301 Dyshidrosis [pompholyx]: Secondary | ICD-10-CM | POA: Diagnosis not present

## 2019-04-04 DIAGNOSIS — M7542 Impingement syndrome of left shoulder: Secondary | ICD-10-CM

## 2019-04-04 DIAGNOSIS — L219 Seborrheic dermatitis, unspecified: Secondary | ICD-10-CM

## 2019-04-04 MED ORDER — CLOBETASOL PROPIONATE 0.05 % EX OINT: 1.0000 "application " | TOPICAL_OINTMENT | Freq: Two times a day (BID) | CUTANEOUS | 0 refills | Status: DC

## 2019-04-04 MED ORDER — CLOBETASOL PROPIONATE 0.05 % EX SOLN: 1.0000 "application " | Freq: Two times a day (BID) | CUTANEOUS | 1 refills | Status: DC

## 2019-04-04 NOTE — Assessment & Plan Note (Signed)
Jerry Shannon has a rash on his finger at the level of the PIP, appears to be dyshidrotic eczema. Clobetasol ointment, patient will occlude twice daily.

## 2019-04-04 NOTE — Assessment & Plan Note (Signed)
Resean has left supraspinatus and infraspinatus tendinopathy, he has had several steroid injections in the past, more recently PRP percutaneous tenotomy. He had months of physical therapy. Persistent discomfort, per patient request we did a repeat subacromial injection, I think he does need a new MRI for surgical planning. He has already seen Dr. Everardo Pacific.

## 2019-04-04 NOTE — Progress Notes (Signed)
    Procedures performed today:    Procedure: Real-time Ultrasound Guided injection of the left subacromial bursa Device: Samsung HS60  Verbal informed consent obtained.  Time-out conducted.  Noted no overlying erythema, induration, or other signs of local infection.  Skin prepped in a sterile fashion.  Local anesthesia: Topical Ethyl chloride.  With sterile technique and under real time ultrasound guidance: 1 cc Kenalog 40, 1 cc lidocaine, 1 cc bupivacaine injected easily Completed without difficulty  Pain immediately resolved suggesting accurate placement of the medication.  Advised to call if fevers/chills, erythema, induration, drainage, or persistent bleeding.  Images permanently stored and available for review in the ultrasound unit.  Impression: Technically successful ultrasound guided injection.  Independent interpretation of tests performed by another provider:   None.  Impression and Recommendations:    Seborrheic dermatitis of scalp Recurrence of seborrheic capitis. Ketoconazole shampoo has only had minimal efficacy at this point, switching to topical Temovate.  Impingement syndrome of both shoulders Emmette has left supraspinatus and infraspinatus tendinopathy, he has had several steroid injections in the past, more recently PRP percutaneous tenotomy. He had months of physical therapy. Persistent discomfort, per patient request we did a repeat subacromial injection, I think he does need a new MRI for surgical planning. He has already seen Dr. Everardo Pacific.  Dyshidrotic eczema Belen has a rash on his finger at the level of the PIP, appears to be dyshidrotic eczema. Clobetasol ointment, patient will occlude twice daily.    ___________________________________________ [name]. Benjamin Stain, M.D., ABFM., CAQSM. Primary Care and Sports Medicine [facility]  Adjunct Instructor of Family Medicine  [employer]

## 2019-04-04 NOTE — Assessment & Plan Note (Signed)
Recurrence of seborrheic capitis. Ketoconazole shampoo has only had minimal efficacy at this point, switching to topical Temovate.

## 2019-04-05 ENCOUNTER — Other Ambulatory Visit: Payer: Self-pay | Admitting: Sports Medicine

## 2019-04-05 DIAGNOSIS — E291 Testicular hypofunction: Secondary | ICD-10-CM

## 2019-04-05 DIAGNOSIS — E785 Hyperlipidemia, unspecified: Secondary | ICD-10-CM

## 2019-04-12 ENCOUNTER — Other Ambulatory Visit: Payer: Self-pay | Admitting: Sports Medicine

## 2019-04-12 DIAGNOSIS — M5416 Radiculopathy, lumbar region: Secondary | ICD-10-CM

## 2019-04-17 ENCOUNTER — Other Ambulatory Visit: Payer: No Typology Code available for payment source

## 2019-04-23 ENCOUNTER — Other Ambulatory Visit: Payer: Self-pay | Admitting: Sports Medicine

## 2019-05-20 ENCOUNTER — Other Ambulatory Visit: Payer: Self-pay | Admitting: Sports Medicine

## 2019-05-20 DIAGNOSIS — M5416 Radiculopathy, lumbar region: Secondary | ICD-10-CM

## 2019-06-19 ENCOUNTER — Other Ambulatory Visit: Payer: Self-pay | Admitting: Sports Medicine

## 2019-06-19 DIAGNOSIS — M5416 Radiculopathy, lumbar region: Secondary | ICD-10-CM

## 2019-07-10 ENCOUNTER — Other Ambulatory Visit: Payer: Self-pay | Admitting: Sports Medicine

## 2019-07-10 DIAGNOSIS — M5416 Radiculopathy, lumbar region: Secondary | ICD-10-CM

## 2019-08-04 ENCOUNTER — Telehealth: Payer: Self-pay

## 2019-08-04 DIAGNOSIS — M5416 Radiculopathy, lumbar region: Secondary | ICD-10-CM

## 2019-08-04 NOTE — Telephone Encounter (Signed)
Jerry Shannon called and left a message stating he needs pain medications for his pain. He stated he will need more tramadol.

## 2019-08-05 MED ORDER — TRAMADOL HCL 50 MG PO TABS: ORAL_TABLET | ORAL | 0 refills | Status: DC

## 2019-08-05 NOTE — Telephone Encounter (Signed)
I will give him a single additional refill, no more, already discussed shoulder MRI and surgical intervention with Dr. Everardo Pacific since I have nothing else to offer nonsurgical.  I do not think he got his shoulder MRI yet either.

## 2019-08-08 NOTE — Telephone Encounter (Signed)
Called and advised patient of recommendations. Patient states that he is using Tramadol for his back and "he cannot take me off this medications". I advised patient since he has not been send since January, it is best he makes appt to discuss this with Dr T. He still has not had his MRI. Patient scheduled for Monday

## 2019-08-14 ENCOUNTER — Ambulatory Visit (INDEPENDENT_AMBULATORY_CARE_PROVIDER_SITE_OTHER): Payer: Commercial Managed Care - PPO | Admitting: Sports Medicine

## 2019-08-14 DIAGNOSIS — M7542 Impingement syndrome of left shoulder: Secondary | ICD-10-CM | POA: Diagnosis not present

## 2019-08-14 DIAGNOSIS — M5416 Radiculopathy, lumbar region: Secondary | ICD-10-CM | POA: Diagnosis not present

## 2019-08-14 DIAGNOSIS — M7541 Impingement syndrome of right shoulder: Secondary | ICD-10-CM

## 2019-08-14 MED ORDER — TRAMADOL HCL 50 MG PO TABS: ORAL_TABLET | ORAL | 3 refills | Status: DC

## 2019-08-14 NOTE — Assessment & Plan Note (Addendum)
Ry returns, he has known lumbar DDD and facet arthrosis, he has not responded to several injections, however tramadol at 2 to 4 pills daily and gabapentin keeps his pain under control, I think is acceptable to continue this long-term. Because his pain is worsening this is considered worsening of a chronic pre-existing condition.

## 2019-08-14 NOTE — Assessment & Plan Note (Signed)
Jerry Shannon has left supraspinatus and infraspinatus tendinopathy, he has had several steroid injections, as well as a PRP percutaneous tenotomy, he did well, at the last visit in January we did a repeat subacromial injection, and he continues to do okay. He has seen Dr. Everardo Pacific, he has no interest in surgery so I am happy to give him a subacromial injection up to 3-4 times a year as needed.

## 2019-08-14 NOTE — Progress Notes (Signed)
    Procedures performed today:    None.  Independent interpretation of notes and tests performed by another provider:   None.  Brief History, Exam, Impression, and Recommendations:    Left lumbar radiculopathy Beauden returns, he has known lumbar DDD and facet arthrosis, he has not responded to several injections, however tramadol at 2 to 4 pills daily and gabapentin keeps his pain under control, I think is acceptable to continue this long-term. Because his pain is worsening this is considered worsening of a chronic pre-existing condition.  Impingement syndrome of both shoulders Harrol has left supraspinatus and infraspinatus tendinopathy, he has had several steroid injections, as well as a PRP percutaneous tenotomy, he did well, at the last visit in January we did a repeat subacromial injection, and he continues to do okay. He has seen Dr. Everardo Pacific, he has no interest in surgery so I am happy to give him a subacromial injection up to 3-4 times a year as needed.    ___________________________________________ [name]. Benjamin Stain, M.D., ABFM., CAQSM. Primary Care and Sports Medicine [facility]  Adjunct Instructor of Family Medicine  [employer]

## 2019-08-24 ENCOUNTER — Other Ambulatory Visit: Payer: Self-pay | Admitting: Sports Medicine

## 2019-08-30 ENCOUNTER — Ambulatory Visit: Payer: Commercial Managed Care - PPO | Admitting: Sports Medicine

## 2019-09-05 ENCOUNTER — Ambulatory Visit (INDEPENDENT_AMBULATORY_CARE_PROVIDER_SITE_OTHER): Payer: Commercial Managed Care - PPO | Admitting: Sports Medicine

## 2019-09-05 ENCOUNTER — Other Ambulatory Visit: Payer: Self-pay

## 2019-09-05 ENCOUNTER — Encounter: Payer: Self-pay | Admitting: Sports Medicine

## 2019-09-05 DIAGNOSIS — G4719 Other hypersomnia: Secondary | ICD-10-CM

## 2019-09-05 NOTE — Progress Notes (Signed)
    Procedures performed today:    None.  Independent interpretation of notes and tests performed by another provider:   None.  Brief History, Exam, Impression, and Recommendations:    Excessive daytime sleepiness Isael continues to have excessive daytime sleepiness, he endorses witnessed apneic episodes, he has a large neck circumference. He would like to proceed with sleep study and CPAP if needed.    ___________________________________________ [name]. Benjamin Stain, M.D., ABFM., CAQSM. Primary Care and Sports Medicine [facility]  Adjunct Instructor of Family Medicine  [employer]

## 2019-09-05 NOTE — Assessment & Plan Note (Signed)
Jerry Shannon continues to have excessive daytime sleepiness, he endorses witnessed apneic episodes, he has a large neck circumference. He would like to proceed with sleep study and CPAP if needed.

## 2019-09-13 NOTE — Telephone Encounter (Signed)
Arline Asp, any ideas?

## 2019-09-14 NOTE — Telephone Encounter (Signed)
Thank you very much Arline Asp!

## 2019-09-14 NOTE — Telephone Encounter (Signed)
I called our new Rep Barbara Cower with [facility] he stated they did not received the referral I sent it to them again and he is going to mark it stat and have them get it scheduled as soon as possible. - CF

## 2019-10-03 ENCOUNTER — Encounter: Payer: Self-pay | Admitting: Sports Medicine

## 2019-10-11 DIAGNOSIS — M5416 Radiculopathy, lumbar region: Secondary | ICD-10-CM

## 2019-10-12 NOTE — Telephone Encounter (Signed)
Physical therapy referral pended for review.

## 2019-10-18 ENCOUNTER — Telehealth: Payer: Self-pay | Admitting: Sports Medicine

## 2019-10-18 ENCOUNTER — Other Ambulatory Visit: Payer: Self-pay | Admitting: Sports Medicine

## 2019-10-18 DIAGNOSIS — G4733 Obstructive sleep apnea (adult) (pediatric): Secondary | ICD-10-CM

## 2019-10-18 NOTE — Assessment & Plan Note (Signed)
Sleep study positive, ordering CPAP.

## 2019-10-18 NOTE — Telephone Encounter (Signed)
Please let Jerry Shannon know that his sleep study was positive for mild sleep apnea and that I am ordering his CPAP.

## 2019-10-19 NOTE — Telephone Encounter (Signed)
Pt has been updated regarding sleep study results and order or CPAP. Pt verbalized understanding. No other inquiries during the call.

## 2019-10-25 ENCOUNTER — Other Ambulatory Visit: Payer: Self-pay | Admitting: Sports Medicine

## 2019-12-22 ENCOUNTER — Other Ambulatory Visit: Payer: Self-pay | Admitting: Sports Medicine

## 2019-12-23 ENCOUNTER — Other Ambulatory Visit: Payer: Self-pay | Admitting: Sports Medicine

## 2019-12-23 DIAGNOSIS — E291 Testicular hypofunction: Secondary | ICD-10-CM

## 2020-01-16 ENCOUNTER — Other Ambulatory Visit: Payer: Self-pay | Admitting: Sports Medicine

## 2020-01-16 DIAGNOSIS — M5416 Radiculopathy, lumbar region: Secondary | ICD-10-CM

## 2020-01-30 ENCOUNTER — Other Ambulatory Visit: Payer: Self-pay | Admitting: Sports Medicine

## 2020-01-30 DIAGNOSIS — L301 Dyshidrosis [pompholyx]: Secondary | ICD-10-CM

## 2020-01-31 MED ORDER — TAZAROTENE 0.1 % EX CREA
TOPICAL_CREAM | Freq: Every day | CUTANEOUS | 11 refills | Status: DC
Start: 2020-01-31 — End: 2021-09-01

## 2020-02-26 ENCOUNTER — Other Ambulatory Visit: Payer: Self-pay | Admitting: Sports Medicine

## 2020-04-29 ENCOUNTER — Other Ambulatory Visit: Payer: Self-pay | Admitting: Sports Medicine

## 2020-04-29 DIAGNOSIS — L301 Dyshidrosis [pompholyx]: Secondary | ICD-10-CM

## 2020-04-29 DIAGNOSIS — L219 Seborrheic dermatitis, unspecified: Secondary | ICD-10-CM

## 2020-04-29 MED ORDER — CLOBETASOL PROPIONATE 0.05 % EX OINT: TOPICAL_OINTMENT | CUTANEOUS | 0 refills | Status: DC

## 2020-04-29 MED ORDER — DICLOFENAC SODIUM 75 MG PO TBEC: 75.0000 mg | DELAYED_RELEASE_TABLET | Freq: Two times a day (BID) | ORAL | 3 refills | Status: DC

## 2020-05-03 ENCOUNTER — Other Ambulatory Visit: Payer: Self-pay | Admitting: Sports Medicine

## 2020-05-20 ENCOUNTER — Other Ambulatory Visit: Payer: Self-pay | Admitting: Sports Medicine

## 2020-05-20 DIAGNOSIS — M5416 Radiculopathy, lumbar region: Secondary | ICD-10-CM

## 2020-07-05 ENCOUNTER — Other Ambulatory Visit: Payer: Self-pay | Admitting: Sports Medicine

## 2020-07-25 ENCOUNTER — Telehealth: Payer: Self-pay

## 2020-07-25 DIAGNOSIS — G4733 Obstructive sleep apnea (adult) (pediatric): Secondary | ICD-10-CM

## 2020-07-25 NOTE — Telephone Encounter (Signed)
Pt called and said that he needs a new cpap and is having a hard time getting [facility] to respond to him. Pt asked for a new prescription to be sent to [facility].

## 2020-07-26 NOTE — Assessment & Plan Note (Signed)
Well with CPAP, telling us he needs a new CPAP machine, new order sent to [facility].

## 2020-08-30 NOTE — Telephone Encounter (Signed)
Patient called to report that he just heard from [facility] after 12-13 weeks of waiting. They have given him 4 days to get in to their location up here which is proving difficult as he works in [place]. Patient is very upset with the company and stated that he does not want to use this company anymore. He stated that he is going to find a company in [place] and will let us know who to send the order to.

## 2020-09-07 DIAGNOSIS — Z20822 Contact with and (suspected) exposure to covid-19: Secondary | ICD-10-CM | POA: Diagnosis not present

## 2020-09-09 ENCOUNTER — Other Ambulatory Visit: Payer: Self-pay | Admitting: Sports Medicine

## 2020-09-10 DIAGNOSIS — Z20822 Contact with and (suspected) exposure to covid-19: Secondary | ICD-10-CM | POA: Diagnosis not present

## 2020-09-11 ENCOUNTER — Other Ambulatory Visit: Payer: Self-pay | Admitting: Sports Medicine

## 2020-09-18 ENCOUNTER — Other Ambulatory Visit: Payer: Self-pay | Admitting: Sports Medicine

## 2020-09-18 DIAGNOSIS — M5416 Radiculopathy, lumbar region: Secondary | ICD-10-CM

## 2020-10-04 ENCOUNTER — Ambulatory Visit (INDEPENDENT_AMBULATORY_CARE_PROVIDER_SITE_OTHER): Payer: BC Managed Care – PPO | Admitting: Sports Medicine

## 2020-10-04 ENCOUNTER — Other Ambulatory Visit: Payer: Self-pay

## 2020-10-04 DIAGNOSIS — Z0000 Encounter for general adult medical examination without abnormal findings: Secondary | ICD-10-CM | POA: Diagnosis not present

## 2020-10-04 DIAGNOSIS — M722 Plantar fascial fibromatosis: Secondary | ICD-10-CM

## 2020-10-04 DIAGNOSIS — Z1211 Encounter for screening for malignant neoplasm of colon: Secondary | ICD-10-CM | POA: Diagnosis not present

## 2020-10-04 DIAGNOSIS — Z79890 Hormone replacement therapy (postmenopausal): Secondary | ICD-10-CM

## 2020-10-04 DIAGNOSIS — E785 Hyperlipidemia, unspecified: Secondary | ICD-10-CM

## 2020-10-04 NOTE — Assessment & Plan Note (Signed)
Classic plantar fasciitis, he will avoid barefoot walking, adding an air heel brace, rehab exercises, return to see me in 4 to 6 weeks, MRI if no better.

## 2020-10-04 NOTE — Assessment & Plan Note (Signed)
Jerry Shannon is using both testosterone cypionate and topical testosterone. He is getting cypionate from a provider in [place]. I did advise him that we would not condone treatment with both, and I am not going to get him supratherapeutic so he would need to continue his testosterone treatment with his provider in [place]. I want to check his labs today, he is aware of the risks of supratherapeutic testosterone dosing/anabolic steroid use. I will do my best to keep him screened and safe from potential complications.

## 2020-10-04 NOTE — Assessment & Plan Note (Signed)
Checking routine labs 

## 2020-10-04 NOTE — Progress Notes (Addendum)
    Procedures performed today:    None.  Independent interpretation of notes and tests performed by another provider:   None.  Brief History, Exam, Impression, and Recommendations:    Hyperlipidemia with target low density lipoprotein (LDL) cholesterol less than 100 mg/dL Checking routine labs.  Long-term current use of testosterone cypionate Jerry Shannon is using both testosterone cypionate and topical testosterone. He is getting cypionate from a provider in [place]. I did advise him that we would not condone treatment with both, and I am not going to get him supratherapeutic so he would need to continue his testosterone treatment with his provider in [place]. I want to check his labs today, he is aware of the risks of supratherapeutic testosterone dosing/anabolic steroid use. I will do my best to keep him screened and safe from potential complications.  Plantar fasciitis, left Classic plantar fasciitis, he will avoid barefoot walking, adding an air heel brace, rehab exercises, return to see me in 4 to 6 weeks, MRI if no better.    ___________________________________________ [name]. Benjamin Stain, M.D., ABFM., CAQSM. Primary Care and Sports Medicine [place] [facility]  Adjunct Instructor of Family Medicine  [employer]

## 2020-10-07 LAB — COMPREHENSIVE METABOLIC PANEL
AG Ratio: 2.1 (calc) (ref 1.0–2.5)
ALT: 56 U/L — ABNORMAL HIGH (ref 9–46)
AST: 42 U/L — ABNORMAL HIGH (ref 10–40)
Albumin: 4.9 g/dL (ref 3.6–5.1)
Alkaline phosphatase (APISO): 55 U/L (ref 36–130)
BUN: 16 mg/dL (ref 7–25)
CO2: 28 mmol/L (ref 20–32)
Calcium: 10.2 mg/dL (ref 8.6–10.3)
Chloride: 101 mmol/L (ref 98–110)
Creat: 1.23 mg/dL (ref 0.60–1.29)
Globulin: 2.3 g/dL (calc) (ref 1.9–3.7)
Glucose, Bld: 80 mg/dL (ref 65–99)
Potassium: 4.4 mmol/L (ref 3.5–5.3)
Sodium: 140 mmol/L (ref 135–146)
Total Bilirubin: 1 mg/dL (ref 0.2–1.2)
Total Protein: 7.2 g/dL (ref 6.1–8.1)

## 2020-10-07 LAB — LIPID PANEL
Cholesterol: 300 mg/dL — ABNORMAL HIGH (ref <200)
HDL: 31 mg/dL — ABNORMAL LOW (ref >=40)
LDL Cholesterol (Calc): 224 mg/dL (calc) — ABNORMAL HIGH
Non-HDL Cholesterol (Calc): 269 mg/dL (calc) — ABNORMAL HIGH (ref <130)
Total CHOL/HDL Ratio: 9.7 (calc) — ABNORMAL HIGH (ref <5.0)
Triglycerides: 245 mg/dL — ABNORMAL HIGH (ref <150)

## 2020-10-07 LAB — TSH: TSH: 2.37 mIU/L (ref 0.40–4.50)

## 2020-10-07 LAB — CBC
HCT: 50.8 % — ABNORMAL HIGH (ref 38.5–50.0)
Hemoglobin: 17.1 g/dL (ref 13.2–17.1)
MCH: 31.1 pg (ref 27.0–33.0)
MCHC: 33.7 g/dL (ref 32.0–36.0)
MCV: 92.4 fL (ref 80.0–100.0)
MPV: 8.9 fL (ref 7.5–12.5)
Platelets: 313 10*3/uL (ref 140–400)
RBC: 5.5 10*6/uL (ref 4.20–5.80)
RDW: 12.8 % (ref 11.0–15.0)
WBC: 8.1 10*3/uL (ref 3.8–10.8)

## 2020-10-07 LAB — PSA, TOTAL AND FREE
PSA, % Free: 10 % (calc) — ABNORMAL LOW (ref >25)
PSA, Free: 0.2 ng/mL
PSA, Total: 2 ng/mL (ref <=4.0)

## 2020-10-07 LAB — TESTOSTERONE, FREE & TOTAL
Free Testosterone: 485.3 pg/mL — ABNORMAL HIGH (ref 35.0–155.0)
Testosterone, Total, LC-MS-MS: 1650 ng/dL — ABNORMAL HIGH (ref 250–1100)

## 2020-10-07 LAB — HEPATITIS C ANTIBODY
Hepatitis C Ab: NONREACTIVE
SIGNAL TO CUT-OFF: 0.01 (ref <1.00)

## 2020-10-19 ENCOUNTER — Other Ambulatory Visit: Payer: Self-pay | Admitting: Family Medicine

## 2020-10-19 DIAGNOSIS — M5416 Radiculopathy, lumbar region: Secondary | ICD-10-CM

## 2020-10-23 NOTE — Telephone Encounter (Signed)
He is asking about 3 medical problems requiring physical exams and procedures and a MyChart message.  He will just have to wait until an appointment opens up or see one of my partners or another sports medicine doctor.  Next step for his foot if he does not respond to 4 to 6 weeks of treatment effective 10/04/2020 is MRI and potential injection.

## 2020-10-24 ENCOUNTER — Other Ambulatory Visit: Payer: Self-pay

## 2020-10-24 DIAGNOSIS — M5416 Radiculopathy, lumbar region: Secondary | ICD-10-CM

## 2020-10-24 MED ORDER — TRAMADOL HCL 50 MG PO TABS: 100.0000 mg | ORAL_TABLET | Freq: Every day | ORAL | 0 refills | Status: DC | PRN

## 2020-10-24 NOTE — Telephone Encounter (Signed)
I called patient to get him scheduled. The times will not work for him. I will keep an eye out for a better appointment time.

## 2020-11-08 ENCOUNTER — Ambulatory Visit: Payer: BC Managed Care – PPO | Admitting: Sports Medicine

## 2020-11-14 DIAGNOSIS — M545 Low back pain, unspecified: Secondary | ICD-10-CM | POA: Diagnosis not present

## 2020-11-14 DIAGNOSIS — M791 Myalgia, unspecified site: Secondary | ICD-10-CM | POA: Diagnosis not present

## 2020-11-14 DIAGNOSIS — Z79891 Long term (current) use of opiate analgesic: Secondary | ICD-10-CM | POA: Diagnosis not present

## 2020-11-14 DIAGNOSIS — M5417 Radiculopathy, lumbosacral region: Secondary | ICD-10-CM | POA: Diagnosis not present

## 2020-11-14 DIAGNOSIS — M5126 Other intervertebral disc displacement, lumbar region: Secondary | ICD-10-CM | POA: Diagnosis not present

## 2020-11-22 ENCOUNTER — Other Ambulatory Visit: Payer: Self-pay

## 2020-11-22 ENCOUNTER — Other Ambulatory Visit: Payer: Self-pay | Admitting: Sports Medicine

## 2020-11-22 ENCOUNTER — Ambulatory Visit: Payer: BC Managed Care – PPO | Admitting: Sports Medicine

## 2020-11-22 DIAGNOSIS — M5416 Radiculopathy, lumbar region: Secondary | ICD-10-CM

## 2020-11-22 DIAGNOSIS — E291 Testicular hypofunction: Secondary | ICD-10-CM

## 2020-11-22 MED ORDER — ANASTROZOLE 1 MG PO TABS: 1.0000 mg | ORAL_TABLET | Freq: Every day | ORAL | 1 refills | Status: DC

## 2020-11-25 MED ORDER — TRAMADOL HCL 50 MG PO TABS: 100.0000 mg | ORAL_TABLET | Freq: Every day | ORAL | 0 refills | Status: DC | PRN

## 2020-11-25 NOTE — Telephone Encounter (Signed)
Done

## 2020-12-15 ENCOUNTER — Other Ambulatory Visit: Payer: Self-pay | Admitting: Sports Medicine

## 2020-12-15 DIAGNOSIS — E291 Testicular hypofunction: Secondary | ICD-10-CM

## 2020-12-26 DIAGNOSIS — M545 Low back pain, unspecified: Secondary | ICD-10-CM | POA: Diagnosis not present

## 2020-12-26 DIAGNOSIS — M5459 Other low back pain: Secondary | ICD-10-CM | POA: Diagnosis not present

## 2020-12-26 DIAGNOSIS — Z96641 Presence of right artificial hip joint: Secondary | ICD-10-CM | POA: Diagnosis not present

## 2020-12-26 DIAGNOSIS — M47816 Spondylosis without myelopathy or radiculopathy, lumbar region: Secondary | ICD-10-CM | POA: Diagnosis not present

## 2020-12-27 DIAGNOSIS — M722 Plantar fascial fibromatosis: Secondary | ICD-10-CM | POA: Diagnosis not present

## 2020-12-27 DIAGNOSIS — M545 Low back pain, unspecified: Secondary | ICD-10-CM | POA: Diagnosis not present

## 2020-12-27 DIAGNOSIS — M791 Myalgia, unspecified site: Secondary | ICD-10-CM | POA: Diagnosis not present

## 2020-12-27 DIAGNOSIS — M7071 Other bursitis of hip, right hip: Secondary | ICD-10-CM | POA: Diagnosis not present

## 2021-01-18 ENCOUNTER — Other Ambulatory Visit: Payer: Self-pay | Admitting: Sports Medicine

## 2021-01-18 DIAGNOSIS — M5416 Radiculopathy, lumbar region: Secondary | ICD-10-CM

## 2021-01-22 ENCOUNTER — Telehealth (INDEPENDENT_AMBULATORY_CARE_PROVIDER_SITE_OTHER): Payer: BC Managed Care – PPO | Admitting: Sports Medicine

## 2021-01-22 ENCOUNTER — Encounter: Payer: Self-pay | Admitting: Sports Medicine

## 2021-01-22 DIAGNOSIS — G4733 Obstructive sleep apnea (adult) (pediatric): Secondary | ICD-10-CM

## 2021-01-22 DIAGNOSIS — M722 Plantar fascial fibromatosis: Secondary | ICD-10-CM | POA: Diagnosis not present

## 2021-01-22 DIAGNOSIS — M545 Low back pain, unspecified: Secondary | ICD-10-CM | POA: Diagnosis not present

## 2021-01-22 MED ORDER — AMBULATORY NON FORMULARY MEDICATION: 0 refills | Status: AC

## 2021-01-22 NOTE — Assessment & Plan Note (Signed)
I am seeing Jerry Shannon again, he needs a new CPAP machine, we have tried to work with [facility] but they have not come through, he had a sleep study on 10/03/2019 with an apnea-hypopnea index of 7.6/h, CPAP was recommended, sleep study results available in the media tab. Placing an additional referral for CPAP with a different company.

## 2021-01-22 NOTE — Progress Notes (Signed)
   Virtual Visit via Telephone   I connected with  Jerry Shannon  on 01/22/21 by telephone/telehealth and verified that I am speaking with the correct person using two identifiers.   I discussed the limitations, risks, security and privacy concerns of performing an evaluation and management service by telephone, including the higher likelihood of inaccurate diagnosis and treatment, and the availability of in person appointments.  We also discussed the likely need of an additional face to face encounter for complete and high quality delivery of care.  I also discussed with the patient that there may be a patient responsible charge related to this service. The patient expressed understanding and wishes to proceed.  Provider location is in medical facility. Patient location is at their home, different from provider location. People involved in care of the patient during this telehealth encounter were myself, my nurse/medical assistant, and my front office/scheduling team member.  Review of Systems: No fevers, chills, night sweats, weight loss, chest pain, or shortness of breath.   Objective Findings:    General: Speaking full sentences, no audible heavy breathing.  Sounds alert and appropriately interactive.    Independent interpretation of tests performed by another provider:   None.  Brief History, Exam, Impression, and Recommendations:    Obstructive sleep apnea I am seeing Jerry Shannon again, he needs a new CPAP machine, we have tried to work with [facility] but they have not come through, he had a sleep study on 10/03/2019 with an apnea-hypopnea index of 7.6/h, CPAP was recommended, sleep study results available in the media tab. Placing an additional referral for CPAP with a different company.  I discussed the above assessment and treatment plan with the patient. The patient was provided an opportunity to ask questions and all were answered. The patient agreed with the plan and demonstrated an  understanding of the instructions.   The patient was advised to call back or seek an in-person evaluation if the symptoms worsen or if the condition fails to improve as anticipated.   I provided 30 minutes of verbal and non-verbal time during this encounter date, time was needed to gather information, review chart, records, communicate/coordinate with staff remotely, as well as complete documentation.   ___________________________________________ Jerry Shannon. Benjamin Stain, M.D., ABFM., CAQSM. Primary Care and Sports Medicine [place] [facility]  Adjunct Professor of Family Medicine  [employer]

## 2021-01-22 NOTE — Addendum Note (Signed)
Addended by: Monica Becton on: 01/22/2021 03:28 PM   Modules accepted: Orders

## 2021-01-22 NOTE — Progress Notes (Signed)
CPAP; advise on how to handle. No meds from [facility] only using Tramadol from [place].

## 2021-02-18 ENCOUNTER — Other Ambulatory Visit: Payer: Self-pay | Admitting: Sports Medicine

## 2021-02-18 DIAGNOSIS — M5416 Radiculopathy, lumbar region: Secondary | ICD-10-CM

## 2021-02-19 NOTE — Telephone Encounter (Signed)
Video visit 01/22/21 Last fill 01/20/21 #120

## 2021-02-19 NOTE — Telephone Encounter (Signed)
Patient aware prescription was refilled.  

## 2021-03-21 ENCOUNTER — Other Ambulatory Visit: Payer: Self-pay | Admitting: Sports Medicine

## 2021-03-21 DIAGNOSIS — M5416 Radiculopathy, lumbar region: Secondary | ICD-10-CM

## 2021-04-17 ENCOUNTER — Other Ambulatory Visit: Payer: Self-pay | Admitting: Sports Medicine

## 2021-04-17 DIAGNOSIS — M5416 Radiculopathy, lumbar region: Secondary | ICD-10-CM

## 2021-04-17 NOTE — Telephone Encounter (Signed)
Last fill: 03/21/2021

## 2021-05-16 ENCOUNTER — Other Ambulatory Visit: Payer: Self-pay | Admitting: Medical-Surgical

## 2021-05-16 DIAGNOSIS — M5416 Radiculopathy, lumbar region: Secondary | ICD-10-CM

## 2021-05-21 ENCOUNTER — Other Ambulatory Visit: Payer: Self-pay

## 2021-05-21 DIAGNOSIS — M5416 Radiculopathy, lumbar region: Secondary | ICD-10-CM

## 2021-05-21 MED ORDER — TRAMADOL HCL 50 MG PO TABS: ORAL_TABLET | ORAL | 0 refills | Status: DC

## 2021-06-22 ENCOUNTER — Other Ambulatory Visit: Payer: Self-pay | Admitting: Sports Medicine

## 2021-06-22 DIAGNOSIS — M5416 Radiculopathy, lumbar region: Secondary | ICD-10-CM

## 2021-07-22 ENCOUNTER — Other Ambulatory Visit: Payer: Self-pay | Admitting: Sports Medicine

## 2021-07-22 DIAGNOSIS — M5416 Radiculopathy, lumbar region: Secondary | ICD-10-CM

## 2021-08-19 ENCOUNTER — Other Ambulatory Visit: Payer: Self-pay | Admitting: Sports Medicine

## 2021-08-19 DIAGNOSIS — M5416 Radiculopathy, lumbar region: Secondary | ICD-10-CM

## 2021-09-01 ENCOUNTER — Ambulatory Visit: Payer: BC Managed Care – PPO | Admitting: Sports Medicine

## 2021-09-01 ENCOUNTER — Ambulatory Visit (INDEPENDENT_AMBULATORY_CARE_PROVIDER_SITE_OTHER): Payer: BC Managed Care – PPO

## 2021-09-01 DIAGNOSIS — R739 Hyperglycemia, unspecified: Secondary | ICD-10-CM

## 2021-09-01 DIAGNOSIS — Z79890 Hormone replacement therapy (postmenopausal): Secondary | ICD-10-CM

## 2021-09-01 DIAGNOSIS — M5416 Radiculopathy, lumbar region: Secondary | ICD-10-CM

## 2021-09-01 DIAGNOSIS — Z96641 Presence of right artificial hip joint: Secondary | ICD-10-CM | POA: Diagnosis not present

## 2021-09-01 DIAGNOSIS — M109 Gout, unspecified: Secondary | ICD-10-CM | POA: Diagnosis not present

## 2021-09-01 DIAGNOSIS — Z9889 Other specified postprocedural states: Secondary | ICD-10-CM

## 2021-09-01 DIAGNOSIS — M545 Low back pain, unspecified: Secondary | ICD-10-CM | POA: Diagnosis not present

## 2021-09-01 DIAGNOSIS — Z471 Aftercare following joint replacement surgery: Secondary | ICD-10-CM | POA: Diagnosis not present

## 2021-09-01 DIAGNOSIS — E785 Hyperlipidemia, unspecified: Secondary | ICD-10-CM

## 2021-09-01 DIAGNOSIS — M1611 Unilateral primary osteoarthritis, right hip: Secondary | ICD-10-CM | POA: Diagnosis not present

## 2021-09-01 DIAGNOSIS — M79675 Pain in left toe(s): Secondary | ICD-10-CM

## 2021-09-01 NOTE — Progress Notes (Signed)
    Procedures performed today:    Procedure:  Injection of left-sided mid lumbar erector spinae trigger point injection Consent obtained and verified. Time-out conducted. Noted no overlying erythema, induration, or other signs of local infection. Skin prepped in a sterile fashion. Topical analgesic spray: Ethyl chloride. Completed without difficulty. Meds: 1 cc lidocaine , 1 cc kenalog  40 spread out into the trigger point. Advised to call if fevers/chills, erythema, induration, drainage, or persistent bleeding.  Independent interpretation of notes and tests performed by another provider:   None.  Brief History, Exam, Impression, and Recommendations:    History of arthroplasty of right hip Right hip x-rays as desired by patient.  Left lumbar radiculopathy Returns with left-sided low back pain, known very mild lumbar DDD with facet arthrosis, he has had a couple of lumbar epidurals L5-S1 in the past. Today he requested trigger point injections to this was done left erector spinae. Updated x-rays today. Home conditioning given, return to see me 6 weeks as needed.  Great toe pain, left There is concern that he has gout, not in a flare now so we will check uric acid levels, osteoarthritis is in the differential here as well, we will investigate but not treat for now.    ___________________________________________ Debby PARAS. Curtis, M.D., ABFM., CAQSM. Primary Care and Sports Medicine [place] [facility]  Adjunct Instructor of Family Medicine  [employer]

## 2021-09-01 NOTE — Assessment & Plan Note (Signed)
There is concern that he has gout, not in a flare now so we will check uric acid levels, osteoarthritis is in the differential here as well, we will investigate but not treat for now.

## 2021-09-01 NOTE — Assessment & Plan Note (Signed)
 Right hip x-rays as desired by patient.

## 2021-09-01 NOTE — Assessment & Plan Note (Signed)
Returns with left-sided low back pain, known very mild lumbar DDD with facet arthrosis, he has had a couple of lumbar epidurals L5-S1 in the past. Today he requested trigger point injections to this was done left erector spinae. Updated x-rays today. Home conditioning given, return to see me 6 weeks as needed.

## 2021-09-02 ENCOUNTER — Encounter (INDEPENDENT_AMBULATORY_CARE_PROVIDER_SITE_OTHER): Payer: BC Managed Care – PPO | Admitting: Sports Medicine

## 2021-09-02 DIAGNOSIS — E291 Testicular hypofunction: Secondary | ICD-10-CM | POA: Diagnosis not present

## 2021-09-05 ENCOUNTER — Ambulatory Visit: Payer: BC Managed Care – PPO | Admitting: Sports Medicine

## 2021-09-05 LAB — CBC
HCT: 51.5 % — ABNORMAL HIGH (ref 38.5–50.0)
Hemoglobin: 17 g/dL (ref 13.2–17.1)
MCH: 31.4 pg (ref 27.0–33.0)
MCHC: 33 g/dL (ref 32.0–36.0)
MCV: 95.2 fL (ref 80.0–100.0)
MPV: 8.9 fL (ref 7.5–12.5)
Platelets: 268 10*3/uL (ref 140–400)
RBC: 5.41 10*6/uL (ref 4.20–5.80)
RDW: 12.8 % (ref 11.0–15.0)
WBC: 5.7 10*3/uL (ref 3.8–10.8)

## 2021-09-05 LAB — COMPLETE METABOLIC PANEL WITH GFR
AG Ratio: 1.9 (calc) (ref 1.0–2.5)
ALT: 54 U/L — ABNORMAL HIGH (ref 9–46)
AST: 39 U/L (ref 10–40)
Albumin: 4.5 g/dL (ref 3.6–5.1)
Alkaline phosphatase (APISO): 53 U/L (ref 36–130)
BUN: 19 mg/dL (ref 7–25)
CO2: 27 mmol/L (ref 20–32)
Calcium: 9.8 mg/dL (ref 8.6–10.3)
Chloride: 108 mmol/L (ref 98–110)
Creat: 1.12 mg/dL (ref 0.60–1.29)
Globulin: 2.4 g/dL (calc) (ref 1.9–3.7)
Glucose, Bld: 102 mg/dL — ABNORMAL HIGH (ref 65–99)
Potassium: 4.4 mmol/L (ref 3.5–5.3)
Sodium: 142 mmol/L (ref 135–146)
Total Bilirubin: 0.7 mg/dL (ref 0.2–1.2)
Total Protein: 6.9 g/dL (ref 6.1–8.1)
eGFR: 81 mL/min/{1.73_m2} (ref >=60)

## 2021-09-05 LAB — LIPID PANEL
Cholesterol: 281 mg/dL — ABNORMAL HIGH (ref <200)
HDL: 33 mg/dL — ABNORMAL LOW (ref >=40)
LDL Cholesterol (Calc): 203 mg/dL (calc) — ABNORMAL HIGH
Non-HDL Cholesterol (Calc): 248 mg/dL (calc) — ABNORMAL HIGH (ref <130)
Total CHOL/HDL Ratio: 8.5 (calc) — ABNORMAL HIGH (ref <5.0)
Triglycerides: 251 mg/dL — ABNORMAL HIGH (ref <150)

## 2021-09-05 LAB — TESTOSTERONE, FREE & TOTAL
Free Testosterone: 144.2 pg/mL (ref 35.0–155.0)
Testosterone, Total, LC-MS-MS: 535 ng/dL (ref 250–1100)

## 2021-09-05 LAB — URIC ACID: Uric Acid, Serum: 4.6 mg/dL (ref 4.0–8.0)

## 2021-09-05 LAB — HEMOGLOBIN A1C
Hgb A1c MFr Bld: 5 % of total Hgb (ref <5.7)
Mean Plasma Glucose: 97 mg/dL
eAG (mmol/L): 5.4 mmol/L

## 2021-09-05 LAB — PSA, TOTAL AND FREE
PSA, % Free: 11 % (calc) — ABNORMAL LOW (ref >25)
PSA, Free: 0.4 ng/mL
PSA, Total: 3.6 ng/mL (ref <=4.0)

## 2021-09-05 LAB — TSH: TSH: 1.35 mIU/L (ref 0.40–4.50)

## 2021-09-09 MED ORDER — TESTOSTERONE CYPIONATE 200 MG/ML IM SOLN: 300.0000 mg | INTRAMUSCULAR | 0 refills | Status: DC

## 2021-09-09 NOTE — Assessment & Plan Note (Signed)
Taking over testosterone supplementation, we will do testosterone cypionate 300 mg every 2 weeks with a repeat testosterone, CBC, PSA level 1 week after the third injection. Patient understands that we will no longer be using testosterone undecanoate

## 2021-09-15 MED ORDER — MUPIROCIN 2 % EX OINT: TOPICAL_OINTMENT | CUTANEOUS | 3 refills | Status: DC

## 2021-09-15 NOTE — Telephone Encounter (Signed)
I spent 5 total minutes of online digital evaluation and management services in this patient-initiated request for online care. 

## 2021-09-15 NOTE — Addendum Note (Signed)
Addended by: Monica Becton on: 09/15/2021 09:18 AM   Modules accepted: Orders

## 2021-09-16 ENCOUNTER — Other Ambulatory Visit: Payer: Self-pay | Admitting: Sports Medicine

## 2021-09-16 DIAGNOSIS — M5416 Radiculopathy, lumbar region: Secondary | ICD-10-CM

## 2021-10-16 ENCOUNTER — Other Ambulatory Visit: Payer: Self-pay | Admitting: Sports Medicine

## 2021-10-16 DIAGNOSIS — M5416 Radiculopathy, lumbar region: Secondary | ICD-10-CM

## 2021-10-17 ENCOUNTER — Telehealth: Payer: Self-pay

## 2021-10-17 NOTE — Telephone Encounter (Addendum)
Initiated Prior authorization [id number] Cypionate 200MG /ML intramuscular solution Via: Covermymeds Case/Key: [id number] Status: approved  as of 10/17/21 Reason:Effective from 10/17/2021 through 10/16/2022.  Notified Pt via: Mychart

## 2021-11-15 ENCOUNTER — Other Ambulatory Visit: Payer: Self-pay | Admitting: Sports Medicine

## 2021-11-15 DIAGNOSIS — M5416 Radiculopathy, lumbar region: Secondary | ICD-10-CM

## 2021-11-29 ENCOUNTER — Other Ambulatory Visit: Payer: Self-pay | Admitting: Sports Medicine

## 2021-11-29 DIAGNOSIS — E291 Testicular hypofunction: Secondary | ICD-10-CM

## 2021-12-15 ENCOUNTER — Other Ambulatory Visit: Payer: Self-pay | Admitting: Sports Medicine

## 2021-12-15 DIAGNOSIS — M5416 Radiculopathy, lumbar region: Secondary | ICD-10-CM

## 2022-01-04 ENCOUNTER — Encounter: Payer: Self-pay | Admitting: Sports Medicine

## 2022-01-04 DIAGNOSIS — N5201 Erectile dysfunction due to arterial insufficiency: Secondary | ICD-10-CM

## 2022-01-05 MED ORDER — TADALAFIL 10 MG PO TABS: 10.0000 mg | ORAL_TABLET | Freq: Every day | ORAL | 3 refills | Status: DC | PRN

## 2022-01-12 ENCOUNTER — Encounter: Payer: Self-pay | Admitting: Sports Medicine

## 2022-01-12 ENCOUNTER — Other Ambulatory Visit: Payer: Self-pay | Admitting: Sports Medicine

## 2022-01-12 DIAGNOSIS — M5416 Radiculopathy, lumbar region: Secondary | ICD-10-CM

## 2022-01-13 MED ORDER — TRAMADOL HCL 50 MG PO TABS: ORAL_TABLET | ORAL | 0 refills | Status: DC

## 2022-01-15 MED ORDER — TRAMADOL HCL 50 MG PO TABS: ORAL_TABLET | ORAL | 0 refills | Status: DC

## 2022-01-15 NOTE — Addendum Note (Signed)
Addended by: Monica Becton on: 01/15/2022 02:57 PM   Modules accepted: Orders

## 2022-02-12 ENCOUNTER — Other Ambulatory Visit: Payer: Self-pay | Admitting: Sports Medicine

## 2022-02-12 DIAGNOSIS — M5416 Radiculopathy, lumbar region: Secondary | ICD-10-CM

## 2022-02-13 ENCOUNTER — Encounter: Payer: Self-pay | Admitting: Sports Medicine

## 2022-02-13 DIAGNOSIS — M5416 Radiculopathy, lumbar region: Secondary | ICD-10-CM

## 2022-02-13 MED ORDER — TRAMADOL HCL 50 MG PO TABS: ORAL_TABLET | ORAL | 0 refills | Status: DC

## 2022-02-28 ENCOUNTER — Other Ambulatory Visit: Payer: Self-pay | Admitting: Sports Medicine

## 2022-02-28 DIAGNOSIS — E291 Testicular hypofunction: Secondary | ICD-10-CM

## 2022-03-15 ENCOUNTER — Other Ambulatory Visit: Payer: Self-pay | Admitting: Sports Medicine

## 2022-03-15 DIAGNOSIS — M5416 Radiculopathy, lumbar region: Secondary | ICD-10-CM

## 2022-04-12 ENCOUNTER — Other Ambulatory Visit: Payer: Self-pay | Admitting: Sports Medicine

## 2022-04-12 DIAGNOSIS — M5416 Radiculopathy, lumbar region: Secondary | ICD-10-CM

## 2022-05-10 ENCOUNTER — Other Ambulatory Visit: Payer: Self-pay | Admitting: Sports Medicine

## 2022-05-10 DIAGNOSIS — M5416 Radiculopathy, lumbar region: Secondary | ICD-10-CM

## 2022-05-20 ENCOUNTER — Encounter: Payer: Self-pay | Admitting: Sports Medicine

## 2022-05-20 DIAGNOSIS — E291 Testicular hypofunction: Secondary | ICD-10-CM

## 2022-05-20 DIAGNOSIS — E785 Hyperlipidemia, unspecified: Secondary | ICD-10-CM

## 2022-05-22 NOTE — Telephone Encounter (Signed)
Thank you, I will sign off, since he has not done the routine stuff I will also order the routine labs and he just needs to get them all done fasting

## 2022-05-29 DIAGNOSIS — E785 Hyperlipidemia, unspecified: Secondary | ICD-10-CM | POA: Diagnosis not present

## 2022-05-29 DIAGNOSIS — E291 Testicular hypofunction: Secondary | ICD-10-CM | POA: Diagnosis not present

## 2022-05-31 ENCOUNTER — Encounter (INDEPENDENT_AMBULATORY_CARE_PROVIDER_SITE_OTHER): Payer: BC Managed Care – PPO | Admitting: Sports Medicine

## 2022-05-31 ENCOUNTER — Other Ambulatory Visit: Payer: Self-pay | Admitting: Sports Medicine

## 2022-05-31 DIAGNOSIS — E291 Testicular hypofunction: Secondary | ICD-10-CM | POA: Diagnosis not present

## 2022-05-31 LAB — CBC WITH DIFFERENTIAL/PLATELET
Basophils Absolute: 0 10*3/uL (ref 0.0–0.2)
Basos: 1 %
EOS (ABSOLUTE): 0.2 10*3/uL (ref 0.0–0.4)
Eos: 4 %
Hematocrit: 46.7 % (ref 37.5–51.0)
Hemoglobin: 15.9 g/dL (ref 13.0–17.7)
Immature Grans (Abs): 0 10*3/uL (ref 0.0–0.1)
Immature Granulocytes: 1 %
Lymphocytes Absolute: 1.1 10*3/uL (ref 0.7–3.1)
Lymphs: 18 %
MCH: 31.7 pg (ref 26.6–33.0)
MCHC: 34 g/dL (ref 31.5–35.7)
MCV: 93 fL (ref 79–97)
Monocytes Absolute: 0.6 10*3/uL (ref 0.1–0.9)
Monocytes: 10 %
Neutrophils Absolute: 4.3 10*3/uL (ref 1.4–7.0)
Neutrophils: 66 %
Platelets: 439 10*3/uL (ref 150–450)
RBC: 5.02 x10E6/uL (ref 4.14–5.80)
RDW: 12.1 % (ref 11.6–15.4)
WBC: 6.3 10*3/uL (ref 3.4–10.8)

## 2022-05-31 LAB — COMPREHENSIVE METABOLIC PANEL
ALT: 47 IU/L — ABNORMAL HIGH (ref 0–44)
AST: 40 IU/L (ref 0–40)
Albumin/Globulin Ratio: 1.8 (ref 1.2–2.2)
Albumin: 4.3 g/dL (ref 4.1–5.1)
Alkaline Phosphatase: 59 IU/L (ref 44–121)
BUN/Creatinine Ratio: 15 (ref 9–20)
BUN: 16 mg/dL (ref 6–24)
Bilirubin Total: 0.7 mg/dL (ref 0.0–1.2)
CO2: 21 mmol/L (ref 20–29)
Calcium: 9.7 mg/dL (ref 8.7–10.2)
Chloride: 101 mmol/L (ref 96–106)
Creatinine, Ser: 1.07 mg/dL (ref 0.76–1.27)
Globulin, Total: 2.4 g/dL (ref 1.5–4.5)
Glucose: 78 mg/dL (ref 70–99)
Potassium: 4.6 mmol/L (ref 3.5–5.2)
Sodium: 138 mmol/L (ref 134–144)
Total Protein: 6.7 g/dL (ref 6.0–8.5)
eGFR: 85 mL/min/{1.73_m2} (ref >59)

## 2022-05-31 LAB — LIPID PANEL
Chol/HDL Ratio: 14.3 ratio — ABNORMAL HIGH (ref 0.0–5.0)
Cholesterol, Total: 272 mg/dL — ABNORMAL HIGH (ref 100–199)
HDL: 19 mg/dL — ABNORMAL LOW (ref >39)
LDL Chol Calc (NIH): 235 mg/dL — ABNORMAL HIGH (ref 0–99)
Triglycerides: 98 mg/dL (ref 0–149)
VLDL Cholesterol Cal: 18 mg/dL (ref 5–40)

## 2022-05-31 LAB — TESTOSTERONE,FREE AND TOTAL
Testosterone, Free: 35.2 pg/mL — ABNORMAL HIGH (ref 7.2–24.0)
Testosterone: >1500 ng/dL — ABNORMAL HIGH (ref 264–916)

## 2022-05-31 LAB — PSA, TOTAL AND FREE
PSA, Free Pct: 9.2 %
PSA, Free: 0.35 ng/mL
Prostate Specific Ag, Serum: 3.8 ng/mL (ref 0.0–4.0)

## 2022-05-31 LAB — TSH: TSH: 2.32 u[IU]/mL (ref 0.450–4.500)

## 2022-05-31 LAB — HEMOGLOBIN A1C
Est. average glucose Bld gHb Est-mCnc: 91 mg/dL
Hgb A1c MFr Bld: 4.8 % (ref 4.8–5.6)

## 2022-05-31 LAB — URIC ACID: Uric Acid: 3.3 mg/dL — ABNORMAL LOW (ref 3.8–8.4)

## 2022-06-01 NOTE — Telephone Encounter (Signed)
I spent 5 total minutes of online digital evaluation and management services in this patient-initiated request for online care. 

## 2022-06-09 ENCOUNTER — Other Ambulatory Visit: Payer: Self-pay | Admitting: Sports Medicine

## 2022-06-09 ENCOUNTER — Encounter: Payer: Self-pay | Admitting: Sports Medicine

## 2022-06-09 DIAGNOSIS — E291 Testicular hypofunction: Secondary | ICD-10-CM

## 2022-06-09 DIAGNOSIS — M5416 Radiculopathy, lumbar region: Secondary | ICD-10-CM

## 2022-06-10 NOTE — Assessment & Plan Note (Signed)
Testosterone levels continue to be too elevated, we have canceled a few of the most recent requests for refills, patient is going to pursue outside venues for testosterone replacement so we will no longer do it here at [facility] health.

## 2022-06-12 ENCOUNTER — Ambulatory Visit (INDEPENDENT_AMBULATORY_CARE_PROVIDER_SITE_OTHER): Payer: BC Managed Care – PPO

## 2022-06-12 ENCOUNTER — Encounter: Payer: Self-pay | Admitting: Sports Medicine

## 2022-06-12 ENCOUNTER — Ambulatory Visit: Payer: BC Managed Care – PPO | Admitting: Sports Medicine

## 2022-06-12 VITALS — BP 150/66 | HR 93 | Wt 184.0 lb

## 2022-06-12 DIAGNOSIS — R030 Elevated blood-pressure reading, without diagnosis of hypertension: Secondary | ICD-10-CM

## 2022-06-12 DIAGNOSIS — M5137 Other intervertebral disc degeneration, lumbosacral region: Secondary | ICD-10-CM | POA: Diagnosis not present

## 2022-06-12 DIAGNOSIS — E7849 Other hyperlipidemia: Secondary | ICD-10-CM | POA: Diagnosis not present

## 2022-06-12 DIAGNOSIS — Z0000 Encounter for general adult medical examination without abnormal findings: Secondary | ICD-10-CM

## 2022-06-12 DIAGNOSIS — Z96641 Presence of right artificial hip joint: Secondary | ICD-10-CM

## 2022-06-12 DIAGNOSIS — M545 Low back pain, unspecified: Secondary | ICD-10-CM | POA: Diagnosis not present

## 2022-06-12 DIAGNOSIS — E291 Testicular hypofunction: Secondary | ICD-10-CM

## 2022-06-12 DIAGNOSIS — M5416 Radiculopathy, lumbar region: Secondary | ICD-10-CM

## 2022-06-12 DIAGNOSIS — Z471 Aftercare following joint replacement surgery: Secondary | ICD-10-CM | POA: Diagnosis not present

## 2022-06-12 DIAGNOSIS — M1611 Unilateral primary osteoarthritis, right hip: Secondary | ICD-10-CM | POA: Diagnosis not present

## 2022-06-12 NOTE — Assessment & Plan Note (Signed)
Getting baseline x-rays for monitoring

## 2022-06-12 NOTE — Assessment & Plan Note (Signed)
Jerry Shannon does have an elevated blood pressure today, it typically runs normal at home, we will simply watch this for now.

## 2022-06-12 NOTE — Addendum Note (Signed)
Addended by: Monica Becton on: 06/12/2022 11:55 AM   Modules accepted: Orders

## 2022-06-12 NOTE — Assessment & Plan Note (Signed)
Kemper does continue to have very elevated cholesterol, he has had LDLs in the 300s prior, it is in the 200s now, some of this may be from his testosterone supplementation. He did not tolerate statins in the past so I think Repatha would be a good option for him, at this point he would like 3 months to do dietary changes and then we can discuss Repatha again. I do not think he will be able to get it down with dietary changes, risks of chronic high cholesterol explained, patient understands.

## 2022-06-12 NOTE — Assessment & Plan Note (Signed)
Known lumbar DDD with facet arthrosis, epidurals have worked in the past, L5-S1. We also did some trigger point injections back in the summertime of 2023 left erector spinae that worked well. He would like updated x-rays, happy to do it for now, he will continue conservative treatment for now per his request.

## 2022-06-12 NOTE — Progress Notes (Addendum)
    Procedures performed today:    None.  Independent interpretation of notes and tests performed by another provider:   None.  Brief History, Exam, Impression, and Recommendations:    Annual physical exam Declines colon cancer screening, risks, benefits explained.  History of arthroplasty of right hip Getting baseline x-rays for monitoring  Familial hyperlipidemia, high LDL Deantonio does continue to have very elevated cholesterol, he has had LDLs in the 300s prior, it is in the 200s now, some of this may be from his testosterone supplementation. He did not tolerate statins in the past so I think Repatha would be a good option for him, at this point he would like 3 months to do dietary changes and then we can discuss Repatha again. I do not think he will be able to get it down with dietary changes, risks of chronic high cholesterol explained, patient understands.  Male hypogonadism Testosterone levels continue to be dramatically elevated, he does understand the risks of supratherapeutic testosterone use, he understands that we will no longer do testosterone supplementation but he is going to pursue a venue outside of [facility] health. As he understands the risks I am still happy to monitor him for complications such as prostate disease and elevated hemoglobin.  Left lumbar radiculopathy Known lumbar DDD with facet arthrosis, epidurals have worked in the past, L5-S1. We also did some trigger point injections back in the summertime of 2023 left erector spinae that worked well. He would like updated x-rays, happy to do it for now, he will continue conservative treatment for now per his request.  Elevated blood pressure reading Aza does have an elevated blood pressure today, it typically runs normal at home, we will simply watch this for now.    ____________________________________________ [name]. Benjamin Stain, M.D., ABFM., CAQSM., AME. Primary Care and Sports Medicine [place]  [facility]  Adjunct Professor of Family Medicine  [place] [employer] [facility] [employer]  Restaurant manager, fast food

## 2022-06-12 NOTE — Assessment & Plan Note (Signed)
Declines colon cancer screening, risks, benefits explained.

## 2022-06-12 NOTE — Assessment & Plan Note (Signed)
Testosterone levels continue to be dramatically elevated, he does understand the risks of supratherapeutic testosterone use, he understands that we will no longer do testosterone supplementation but he is going to pursue a venue outside of [place]. As he understands the risks I am still happy to monitor him for complications such as prostate disease and elevated hemoglobin.

## 2022-07-03 ENCOUNTER — Encounter: Payer: Self-pay | Admitting: Sports Medicine

## 2022-07-06 ENCOUNTER — Other Ambulatory Visit: Payer: Self-pay | Admitting: Sports Medicine

## 2022-07-06 DIAGNOSIS — M5416 Radiculopathy, lumbar region: Secondary | ICD-10-CM

## 2022-07-06 DIAGNOSIS — J3489 Other specified disorders of nose and nasal sinuses: Secondary | ICD-10-CM | POA: Diagnosis not present

## 2022-07-22 ENCOUNTER — Encounter: Payer: Self-pay | Admitting: Sports Medicine

## 2022-07-22 DIAGNOSIS — N5201 Erectile dysfunction due to arterial insufficiency: Secondary | ICD-10-CM

## 2022-07-27 MED ORDER — TADALAFIL 10 MG PO TABS
10.0000 mg | ORAL_TABLET | Freq: Every day | ORAL | 3 refills | Status: DC | PRN
Start: 2022-07-27 — End: 2023-10-25

## 2022-08-05 ENCOUNTER — Other Ambulatory Visit: Payer: Self-pay | Admitting: Sports Medicine

## 2022-08-05 DIAGNOSIS — M5416 Radiculopathy, lumbar region: Secondary | ICD-10-CM

## 2022-09-01 ENCOUNTER — Other Ambulatory Visit: Payer: Self-pay | Admitting: Sports Medicine

## 2022-09-01 DIAGNOSIS — M5416 Radiculopathy, lumbar region: Secondary | ICD-10-CM

## 2022-10-01 ENCOUNTER — Other Ambulatory Visit: Payer: Self-pay | Admitting: Sports Medicine

## 2022-10-01 DIAGNOSIS — M5416 Radiculopathy, lumbar region: Secondary | ICD-10-CM

## 2022-10-16 ENCOUNTER — Ambulatory Visit: Payer: BC Managed Care – PPO | Admitting: Sports Medicine

## 2022-10-16 ENCOUNTER — Ambulatory Visit (INDEPENDENT_AMBULATORY_CARE_PROVIDER_SITE_OTHER): Payer: BC Managed Care – PPO

## 2022-10-16 DIAGNOSIS — M5416 Radiculopathy, lumbar region: Secondary | ICD-10-CM

## 2022-10-16 DIAGNOSIS — M545 Low back pain, unspecified: Secondary | ICD-10-CM | POA: Diagnosis not present

## 2022-10-16 DIAGNOSIS — Z96641 Presence of right artificial hip joint: Secondary | ICD-10-CM | POA: Diagnosis not present

## 2022-10-16 DIAGNOSIS — M47816 Spondylosis without myelopathy or radiculopathy, lumbar region: Secondary | ICD-10-CM | POA: Diagnosis not present

## 2022-10-16 DIAGNOSIS — M5136 Other intervertebral disc degeneration, lumbar region: Secondary | ICD-10-CM | POA: Diagnosis not present

## 2022-10-16 MED ORDER — METHYLPREDNISOLONE SODIUM SUCC 125 MG IJ SOLR
125.0000 mg | Freq: Once | INTRAMUSCULAR | Status: AC
  Administered 2022-10-16: 125 mg via INTRAMUSCULAR

## 2022-10-16 NOTE — Addendum Note (Signed)
Addended by: Carren Rang A on: 10/16/2022 04:34 PM   Modules accepted: Orders

## 2022-10-16 NOTE — Assessment & Plan Note (Signed)
Pleasant 52 year old male, known lumbar DDD from L4-S1, more recently he bent over and felt a pop and pain in his low back, some radiation down the legs. Pain has improved over time, still worse with sitting, flexion, Valsalva. He desires more conservative treatment, and is requesting shot of Solu-Medrol, I think this is entirely appropriate. I would like him to do some advanced herniated disc conditioning exercises and I would like an updated x-ray, we will see him back 6 weeks as needed.

## 2022-10-16 NOTE — Progress Notes (Signed)
    Procedures performed today:    None.  Independent interpretation of notes and tests performed by another provider:   None.  Brief History, Exam, Impression, and Recommendations:    Left lumbar radiculopathy Pleasant 52 year old male, known lumbar DDD from L4-S1, more recently he bent over and felt a pop and pain in his low back, some radiation down the legs. Pain has improved over time, still worse with sitting, flexion, Valsalva. He desires more conservative treatment, and is requesting shot of Solu-Medrol, I think this is entirely appropriate. I would like him to do some advanced herniated disc conditioning exercises and I would like an updated x-ray, we will see him back 6 weeks as needed.    ____________________________________________ [name]. Benjamin Stain, M.D., ABFM., CAQSM., AME. Primary Care and Sports Medicine [place] [facility]  Adjunct Professor of Family Medicine  [place] [employer] [facility] [employer]  Restaurant manager, fast food

## 2022-10-29 ENCOUNTER — Other Ambulatory Visit: Payer: Self-pay | Admitting: Sports Medicine

## 2022-10-29 DIAGNOSIS — M5416 Radiculopathy, lumbar region: Secondary | ICD-10-CM

## 2022-10-31 ENCOUNTER — Encounter: Payer: Self-pay | Admitting: Sports Medicine

## 2022-11-02 NOTE — Telephone Encounter (Signed)
Last fill 10/01/22 last visit 10/16/22

## 2022-11-30 ENCOUNTER — Other Ambulatory Visit: Payer: Self-pay | Admitting: Sports Medicine

## 2022-11-30 DIAGNOSIS — M5416 Radiculopathy, lumbar region: Secondary | ICD-10-CM

## 2022-12-15 ENCOUNTER — Encounter: Payer: Self-pay | Admitting: Sports Medicine

## 2022-12-30 ENCOUNTER — Other Ambulatory Visit: Payer: Self-pay | Admitting: Sports Medicine

## 2022-12-30 DIAGNOSIS — M5416 Radiculopathy, lumbar region: Secondary | ICD-10-CM

## 2023-01-28 ENCOUNTER — Other Ambulatory Visit: Payer: Self-pay | Admitting: Sports Medicine

## 2023-01-28 DIAGNOSIS — M5416 Radiculopathy, lumbar region: Secondary | ICD-10-CM

## 2023-02-11 ENCOUNTER — Ambulatory Visit: Payer: BC Managed Care – PPO | Admitting: Sports Medicine

## 2023-02-11 ENCOUNTER — Encounter: Payer: Self-pay | Admitting: Sports Medicine

## 2023-02-11 ENCOUNTER — Ambulatory Visit: Payer: BC Managed Care – PPO

## 2023-02-11 ENCOUNTER — Other Ambulatory Visit (INDEPENDENT_AMBULATORY_CARE_PROVIDER_SITE_OTHER): Payer: BC Managed Care – PPO

## 2023-02-11 DIAGNOSIS — M1612 Unilateral primary osteoarthritis, left hip: Secondary | ICD-10-CM

## 2023-02-11 DIAGNOSIS — M25552 Pain in left hip: Secondary | ICD-10-CM | POA: Diagnosis not present

## 2023-02-11 DIAGNOSIS — Z96641 Presence of right artificial hip joint: Secondary | ICD-10-CM | POA: Diagnosis not present

## 2023-02-11 NOTE — Assessment & Plan Note (Signed)
History of right hip arthroplasty, increasing pain left hip and groin, loss of internal rotation, x-rays previously did show osteoarthritis, we will do a hip joint injection today as oral NSAIDs and tramadol have failed. Formal physical therapy for both hips, return in 4 to 6 weeks for this.

## 2023-02-11 NOTE — Progress Notes (Signed)
    Procedures performed today:    Procedure: Real-time Ultrasound Guided injection of the left hip joint Device: Samsung HS60  Verbal informed consent obtained.  Time-out conducted.  Noted no overlying erythema, induration, or other signs of local infection.  Skin prepped in a sterile fashion.  Local anesthesia: Topical Ethyl chloride.  With sterile technique and under real time ultrasound guidance: Arthritic joint noted, 1 cc Kenalog 40, 2 cc lidocaine, 2 cc bupivacaine injected easily Completed without difficulty  Advised to call if fevers/chills, erythema, induration, drainage, or persistent bleeding.  Images permanently stored and available for review in PACS.  Impression: Technically successful ultrasound guided injection.  Independent interpretation of notes and tests performed by another provider:   None.  Brief History, Exam, Impression, and Recommendations:    Primary osteoarthritis of left hip History of right hip arthroplasty, increasing pain left hip and groin, loss of internal rotation, x-rays previously did show osteoarthritis, we will do a hip joint injection today as oral NSAIDs and tramadol have failed. Formal physical therapy for both hips, return in 4 to 6 weeks for this.    ____________________________________________ [name]. Benjamin Stain, M.D., ABFM., CAQSM., AME. Primary Care and Sports Medicine [place] [facility]  Adjunct Professor of Family Medicine  [place] [employer] [facility] [employer]  Restaurant manager, fast food

## 2023-02-12 DIAGNOSIS — M1612 Unilateral primary osteoarthritis, left hip: Secondary | ICD-10-CM | POA: Diagnosis not present

## 2023-02-12 MED ORDER — TRIAMCINOLONE ACETONIDE 40 MG/ML IJ SUSP
40.0000 mg | Freq: Once | INTRAMUSCULAR | Status: AC
  Administered 2023-02-12: 40 mg via INTRAMUSCULAR

## 2023-02-12 NOTE — Addendum Note (Signed)
Addended by: Carren Rang A on: 02/12/2023 01:31 PM   Modules accepted: Orders

## 2023-02-23 ENCOUNTER — Encounter: Payer: Self-pay | Admitting: Sports Medicine

## 2023-02-26 ENCOUNTER — Other Ambulatory Visit: Payer: Self-pay | Admitting: Sports Medicine

## 2023-02-26 DIAGNOSIS — M5416 Radiculopathy, lumbar region: Secondary | ICD-10-CM

## 2023-02-27 ENCOUNTER — Encounter: Payer: Self-pay | Admitting: Sports Medicine

## 2023-02-27 ENCOUNTER — Other Ambulatory Visit: Payer: Self-pay | Admitting: Sports Medicine

## 2023-02-27 DIAGNOSIS — M5416 Radiculopathy, lumbar region: Secondary | ICD-10-CM

## 2023-03-01 ENCOUNTER — Other Ambulatory Visit: Payer: Self-pay | Admitting: Sports Medicine

## 2023-03-01 DIAGNOSIS — M5416 Radiculopathy, lumbar region: Secondary | ICD-10-CM

## 2023-03-01 MED ORDER — TRAMADOL HCL 50 MG PO TABS
ORAL_TABLET | ORAL | 0 refills | Status: DC
Start: 2023-03-01 — End: 2023-03-25

## 2023-03-25 ENCOUNTER — Other Ambulatory Visit: Payer: Self-pay | Admitting: Sports Medicine

## 2023-03-25 ENCOUNTER — Ambulatory Visit: Payer: BC Managed Care – PPO | Admitting: Sports Medicine

## 2023-03-25 ENCOUNTER — Encounter: Payer: Self-pay | Admitting: Sports Medicine

## 2023-03-25 DIAGNOSIS — M1612 Unilateral primary osteoarthritis, left hip: Secondary | ICD-10-CM | POA: Diagnosis not present

## 2023-03-25 DIAGNOSIS — B001 Herpesviral vesicular dermatitis: Secondary | ICD-10-CM

## 2023-03-25 DIAGNOSIS — M5416 Radiculopathy, lumbar region: Secondary | ICD-10-CM

## 2023-03-25 MED ORDER — ACYCLOVIR 5 % EX OINT: 1.0000 | TOPICAL_OINTMENT | CUTANEOUS | 3 refills | Status: DC

## 2023-03-25 MED ORDER — TRAMADOL HCL 50 MG PO TABS: 100.0000 mg | ORAL_TABLET | Freq: Three times a day (TID) | ORAL | 3 refills | Status: DC | PRN

## 2023-03-25 NOTE — Progress Notes (Signed)
    Procedures performed today:    None.  Independent interpretation of notes and tests performed by another provider:   None.  Brief History, Exam, Impression, and Recommendations:    Fever blister Left upper lip herpes labialis, adding acyclovir ointment.  Primary osteoarthritis of left hip History of right hip arthroplasty, at the last visit he was having increasing pain left hip and groin with loss of internal rotation, x-rays did previously show arthritis, we did a joint injection, he did little bit better but at this point he does need to bump up the tramadol to the maximum dose, we will do this, 6 pills/day, if this is ineffective he will need to proceed with hip arthroplasty on the left.    ____________________________________________ [name]. Benjamin Stain, M.D., ABFM., CAQSM., AME. Primary Care and Sports Medicine [place] [facility]  Adjunct Professor of Family Medicine  [place] [employer]  Restaurant manager, fast food

## 2023-03-25 NOTE — Assessment & Plan Note (Signed)
Left upper lip herpes labialis, adding acyclovir ointment.

## 2023-03-25 NOTE — Assessment & Plan Note (Signed)
History of right hip arthroplasty, at the last visit he was having increasing pain left hip and groin with loss of internal rotation, x-rays did previously show arthritis, we did a joint injection, he did little bit better but at this point he does need to bump up the tramadol to the maximum dose, we will do this, 6 pills/day, if this is ineffective he will need to proceed with hip arthroplasty on the left.

## 2023-03-26 ENCOUNTER — Other Ambulatory Visit: Payer: Self-pay | Admitting: Sports Medicine

## 2023-03-26 DIAGNOSIS — M25552 Pain in left hip: Secondary | ICD-10-CM | POA: Diagnosis not present

## 2023-03-26 DIAGNOSIS — M5416 Radiculopathy, lumbar region: Secondary | ICD-10-CM | POA: Diagnosis not present

## 2023-03-26 DIAGNOSIS — B001 Herpesviral vesicular dermatitis: Secondary | ICD-10-CM

## 2023-03-26 DIAGNOSIS — M5459 Other low back pain: Secondary | ICD-10-CM | POA: Diagnosis not present

## 2023-03-26 MED ORDER — ACYCLOVIR 5 % EX OINT: 1.0000 | TOPICAL_OINTMENT | CUTANEOUS | 3 refills | Status: DC

## 2023-03-26 NOTE — Addendum Note (Signed)
Addended by: Monica Becton on: 03/26/2023 12:45 PM   Modules accepted: Orders

## 2023-03-29 ENCOUNTER — Encounter: Payer: Self-pay | Admitting: Sports Medicine

## 2023-03-29 DIAGNOSIS — M1612 Unilateral primary osteoarthritis, left hip: Secondary | ICD-10-CM

## 2023-04-19 ENCOUNTER — Encounter: Payer: Self-pay | Admitting: Sports Medicine

## 2023-04-19 DIAGNOSIS — M1612 Unilateral primary osteoarthritis, left hip: Secondary | ICD-10-CM | POA: Diagnosis not present

## 2023-04-19 DIAGNOSIS — M25552 Pain in left hip: Secondary | ICD-10-CM | POA: Diagnosis not present

## 2023-04-19 DIAGNOSIS — F17210 Nicotine dependence, cigarettes, uncomplicated: Secondary | ICD-10-CM | POA: Diagnosis not present

## 2023-04-20 NOTE — Telephone Encounter (Signed)
This is a request for medical records.

## 2023-04-30 ENCOUNTER — Encounter: Payer: Self-pay | Admitting: Sports Medicine

## 2023-04-30 DIAGNOSIS — M25552 Pain in left hip: Secondary | ICD-10-CM | POA: Diagnosis not present

## 2023-04-30 DIAGNOSIS — M1612 Unilateral primary osteoarthritis, left hip: Secondary | ICD-10-CM | POA: Diagnosis not present

## 2023-05-07 ENCOUNTER — Ambulatory Visit: Payer: BC Managed Care – PPO | Admitting: Sports Medicine

## 2023-05-07 ENCOUNTER — Encounter: Payer: Self-pay | Admitting: Sports Medicine

## 2023-05-07 VITALS — BP 133/84 | HR 98 | Resp 20 | Ht 67.0 in

## 2023-05-07 DIAGNOSIS — Z01818 Encounter for other preprocedural examination: Secondary | ICD-10-CM | POA: Insufficient documentation

## 2023-05-07 NOTE — Assessment & Plan Note (Signed)
 This is a very pleasant 53 year old male, he is here for preoperative clearance for a left total hip arthroplasty anterior approach. He has not had any anesthesia complications in the past. He is able to exercise at a high level without chest pain or shortness of breath so he has much greater than 4 metabolic equivalents of cardiac capacity. Per request we will get a CBC and CMP today, he has no history of diabetes. Twelve-lead ECG was performed and interpreted by me today, he does have some left axis deviation likely related to hypertension in the past. His blood pressures at home typically run in the 120s over 80s, it is elevated today. He will do some ambulatory monitoring and let me know. Other than that I think he is optimized from medical and cardiac standpoint for intermediate risk noncardiac surgery such as a hip replacement.

## 2023-05-07 NOTE — Progress Notes (Signed)
    Procedures performed today:    Twelve-lead ECG performed and interpreted by me, he has a rate of 94 bpm, left axis deviation, normal PR interval, QRS complex and no ST changes.  Independent interpretation of notes and tests performed by another provider:   None.  Brief History, Exam, Impression, and Recommendations:    Pre-operative clearance This is a very pleasant 53 year old male, he is here for preoperative clearance for a left total hip arthroplasty anterior approach. He has not had any anesthesia complications in the past. He is able to exercise at a high level without chest pain or shortness of breath so he has much greater than 4 metabolic equivalents of cardiac capacity. Per request we will get a CBC and CMP today, he has no history of diabetes. Twelve-lead ECG was performed and interpreted by me today, he does have some left axis deviation likely related to hypertension in the past. His blood pressures at home typically run in the 120s over 80s, it is elevated today. He will do some ambulatory monitoring and let me know. Other than that I think he is optimized from medical and cardiac standpoint for intermediate risk noncardiac surgery such as a hip replacement.    ____________________________________________ [name]. Benjamin Stain, M.D., ABFM., CAQSM., AME. Primary Care and Sports Medicine [place] [facility]  Adjunct Professor of Family Medicine  [place] [employer] [facility] [employer]  Restaurant manager, fast food

## 2023-05-08 LAB — CBC
Hematocrit: 51.3 % — ABNORMAL HIGH (ref 37.5–51.0)
Hemoglobin: 17.4 g/dL (ref 13.0–17.7)
MCH: 31.4 pg (ref 26.6–33.0)
MCHC: 33.9 g/dL (ref 31.5–35.7)
MCV: 93 fL (ref 79–97)
Platelets: 318 10*3/uL (ref 150–450)
RBC: 5.54 x10E6/uL (ref 4.14–5.80)
RDW: 12.3 % (ref 11.6–15.4)
WBC: 6.1 10*3/uL (ref 3.4–10.8)

## 2023-05-08 LAB — COMPREHENSIVE METABOLIC PANEL
ALT: 57 IU/L — ABNORMAL HIGH (ref 0–44)
AST: 47 IU/L — ABNORMAL HIGH (ref 0–40)
Albumin: 4.6 g/dL (ref 3.8–4.9)
Alkaline Phosphatase: 57 IU/L (ref 44–121)
BUN/Creatinine Ratio: 16 (ref 9–20)
BUN: 18 mg/dL (ref 6–24)
Bilirubin Total: 0.8 mg/dL (ref 0.0–1.2)
CO2: 22 mmol/L (ref 20–29)
Calcium: 9.5 mg/dL (ref 8.7–10.2)
Chloride: 101 mmol/L (ref 96–106)
Creatinine, Ser: 1.12 mg/dL (ref 0.76–1.27)
Globulin, Total: 1.9 g/dL (ref 1.5–4.5)
Glucose: 81 mg/dL (ref 70–99)
Potassium: 4.7 mmol/L (ref 3.5–5.2)
Sodium: 139 mmol/L (ref 134–144)
Total Protein: 6.5 g/dL (ref 6.0–8.5)
eGFR: 80 mL/min/{1.73_m2} (ref >59)

## 2023-05-10 DIAGNOSIS — M1612 Unilateral primary osteoarthritis, left hip: Secondary | ICD-10-CM | POA: Diagnosis not present

## 2023-05-21 DIAGNOSIS — G4730 Sleep apnea, unspecified: Secondary | ICD-10-CM | POA: Diagnosis not present

## 2023-05-21 DIAGNOSIS — R262 Difficulty in walking, not elsewhere classified: Secondary | ICD-10-CM | POA: Diagnosis not present

## 2023-05-21 DIAGNOSIS — M1612 Unilateral primary osteoarthritis, left hip: Secondary | ICD-10-CM | POA: Diagnosis not present

## 2023-05-21 DIAGNOSIS — R32 Unspecified urinary incontinence: Secondary | ICD-10-CM | POA: Diagnosis not present

## 2023-05-21 DIAGNOSIS — M6281 Muscle weakness (generalized): Secondary | ICD-10-CM | POA: Diagnosis not present

## 2023-05-21 DIAGNOSIS — Z79899 Other long term (current) drug therapy: Secondary | ICD-10-CM | POA: Diagnosis not present

## 2023-05-21 DIAGNOSIS — R2689 Other abnormalities of gait and mobility: Secondary | ICD-10-CM | POA: Diagnosis not present

## 2023-05-21 DIAGNOSIS — E7800 Pure hypercholesterolemia, unspecified: Secondary | ICD-10-CM | POA: Diagnosis not present

## 2023-05-21 DIAGNOSIS — Z9181 History of falling: Secondary | ICD-10-CM | POA: Diagnosis not present

## 2023-05-21 DIAGNOSIS — M549 Dorsalgia, unspecified: Secondary | ICD-10-CM | POA: Diagnosis not present

## 2023-05-24 NOTE — Addendum Note (Signed)
 Addended by: Samule Dry on: 05/24/2023 04:12 PM   Modules accepted: Orders

## 2023-06-02 DIAGNOSIS — M6281 Muscle weakness (generalized): Secondary | ICD-10-CM | POA: Diagnosis not present

## 2023-06-02 DIAGNOSIS — E7800 Pure hypercholesterolemia, unspecified: Secondary | ICD-10-CM | POA: Diagnosis not present

## 2023-06-02 DIAGNOSIS — R262 Difficulty in walking, not elsewhere classified: Secondary | ICD-10-CM | POA: Diagnosis not present

## 2023-06-02 DIAGNOSIS — Z9181 History of falling: Secondary | ICD-10-CM | POA: Diagnosis not present

## 2023-06-02 DIAGNOSIS — Z79899 Other long term (current) drug therapy: Secondary | ICD-10-CM | POA: Diagnosis not present

## 2023-06-02 DIAGNOSIS — G4730 Sleep apnea, unspecified: Secondary | ICD-10-CM | POA: Diagnosis not present

## 2023-06-02 DIAGNOSIS — R2689 Other abnormalities of gait and mobility: Secondary | ICD-10-CM | POA: Diagnosis not present

## 2023-06-02 DIAGNOSIS — Z96643 Presence of artificial hip joint, bilateral: Secondary | ICD-10-CM | POA: Diagnosis not present

## 2023-06-02 DIAGNOSIS — R32 Unspecified urinary incontinence: Secondary | ICD-10-CM | POA: Diagnosis not present

## 2023-06-02 DIAGNOSIS — M1612 Unilateral primary osteoarthritis, left hip: Secondary | ICD-10-CM | POA: Diagnosis not present

## 2023-06-02 DIAGNOSIS — M549 Dorsalgia, unspecified: Secondary | ICD-10-CM | POA: Diagnosis not present

## 2023-06-03 DIAGNOSIS — M1612 Unilateral primary osteoarthritis, left hip: Secondary | ICD-10-CM | POA: Diagnosis not present

## 2023-06-03 DIAGNOSIS — R32 Unspecified urinary incontinence: Secondary | ICD-10-CM | POA: Diagnosis not present

## 2023-06-03 DIAGNOSIS — R2689 Other abnormalities of gait and mobility: Secondary | ICD-10-CM | POA: Diagnosis not present

## 2023-06-03 DIAGNOSIS — G4730 Sleep apnea, unspecified: Secondary | ICD-10-CM | POA: Diagnosis not present

## 2023-06-03 DIAGNOSIS — Z9181 History of falling: Secondary | ICD-10-CM | POA: Diagnosis not present

## 2023-06-03 DIAGNOSIS — Z79899 Other long term (current) drug therapy: Secondary | ICD-10-CM | POA: Diagnosis not present

## 2023-06-03 DIAGNOSIS — R262 Difficulty in walking, not elsewhere classified: Secondary | ICD-10-CM | POA: Diagnosis not present

## 2023-06-03 DIAGNOSIS — M549 Dorsalgia, unspecified: Secondary | ICD-10-CM | POA: Diagnosis not present

## 2023-06-03 DIAGNOSIS — M6281 Muscle weakness (generalized): Secondary | ICD-10-CM | POA: Diagnosis not present

## 2023-06-03 DIAGNOSIS — E7800 Pure hypercholesterolemia, unspecified: Secondary | ICD-10-CM | POA: Diagnosis not present

## 2023-06-04 DIAGNOSIS — Z96642 Presence of left artificial hip joint: Secondary | ICD-10-CM | POA: Diagnosis not present

## 2023-06-07 DIAGNOSIS — Z471 Aftercare following joint replacement surgery: Secondary | ICD-10-CM | POA: Diagnosis not present

## 2023-06-09 DIAGNOSIS — Z471 Aftercare following joint replacement surgery: Secondary | ICD-10-CM | POA: Diagnosis not present

## 2023-06-11 DIAGNOSIS — E7800 Pure hypercholesterolemia, unspecified: Secondary | ICD-10-CM | POA: Diagnosis not present

## 2023-06-11 DIAGNOSIS — G8929 Other chronic pain: Secondary | ICD-10-CM | POA: Diagnosis not present

## 2023-06-11 DIAGNOSIS — Z471 Aftercare following joint replacement surgery: Secondary | ICD-10-CM | POA: Diagnosis not present

## 2023-06-11 DIAGNOSIS — G4730 Sleep apnea, unspecified: Secondary | ICD-10-CM | POA: Diagnosis not present

## 2023-06-11 DIAGNOSIS — M47816 Spondylosis without myelopathy or radiculopathy, lumbar region: Secondary | ICD-10-CM | POA: Diagnosis not present

## 2023-06-11 DIAGNOSIS — Z96643 Presence of artificial hip joint, bilateral: Secondary | ICD-10-CM | POA: Diagnosis not present

## 2023-06-14 DIAGNOSIS — G8929 Other chronic pain: Secondary | ICD-10-CM | POA: Diagnosis not present

## 2023-06-14 DIAGNOSIS — G4730 Sleep apnea, unspecified: Secondary | ICD-10-CM | POA: Diagnosis not present

## 2023-06-14 DIAGNOSIS — M47816 Spondylosis without myelopathy or radiculopathy, lumbar region: Secondary | ICD-10-CM | POA: Diagnosis not present

## 2023-06-14 DIAGNOSIS — E7800 Pure hypercholesterolemia, unspecified: Secondary | ICD-10-CM | POA: Diagnosis not present

## 2023-06-14 DIAGNOSIS — Z96643 Presence of artificial hip joint, bilateral: Secondary | ICD-10-CM | POA: Diagnosis not present

## 2023-06-14 DIAGNOSIS — Z471 Aftercare following joint replacement surgery: Secondary | ICD-10-CM | POA: Diagnosis not present

## 2023-06-16 DIAGNOSIS — G4730 Sleep apnea, unspecified: Secondary | ICD-10-CM | POA: Diagnosis not present

## 2023-06-16 DIAGNOSIS — E7800 Pure hypercholesterolemia, unspecified: Secondary | ICD-10-CM | POA: Diagnosis not present

## 2023-06-16 DIAGNOSIS — Z96643 Presence of artificial hip joint, bilateral: Secondary | ICD-10-CM | POA: Diagnosis not present

## 2023-06-16 DIAGNOSIS — G8929 Other chronic pain: Secondary | ICD-10-CM | POA: Diagnosis not present

## 2023-06-16 DIAGNOSIS — M47816 Spondylosis without myelopathy or radiculopathy, lumbar region: Secondary | ICD-10-CM | POA: Diagnosis not present

## 2023-06-16 DIAGNOSIS — Z471 Aftercare following joint replacement surgery: Secondary | ICD-10-CM | POA: Diagnosis not present

## 2023-06-17 ENCOUNTER — Encounter: Payer: Self-pay | Admitting: Sports Medicine

## 2023-06-18 ENCOUNTER — Ambulatory Visit

## 2023-06-18 ENCOUNTER — Ambulatory Visit (INDEPENDENT_AMBULATORY_CARE_PROVIDER_SITE_OTHER): Admitting: Sports Medicine

## 2023-06-18 VITALS — BP 127/68 | HR 103 | Resp 20 | Ht 67.0 in

## 2023-06-18 DIAGNOSIS — I82452 Acute embolism and thrombosis of left peroneal vein: Secondary | ICD-10-CM | POA: Insufficient documentation

## 2023-06-18 DIAGNOSIS — Z96643 Presence of artificial hip joint, bilateral: Secondary | ICD-10-CM

## 2023-06-18 DIAGNOSIS — M7989 Other specified soft tissue disorders: Secondary | ICD-10-CM

## 2023-06-18 DIAGNOSIS — D62 Acute posthemorrhagic anemia: Secondary | ICD-10-CM | POA: Diagnosis not present

## 2023-06-18 DIAGNOSIS — M7732 Calcaneal spur, left foot: Secondary | ICD-10-CM | POA: Diagnosis not present

## 2023-06-18 DIAGNOSIS — G4733 Obstructive sleep apnea (adult) (pediatric): Secondary | ICD-10-CM

## 2023-06-18 DIAGNOSIS — M25572 Pain in left ankle and joints of left foot: Secondary | ICD-10-CM | POA: Diagnosis not present

## 2023-06-18 DIAGNOSIS — M79605 Pain in left leg: Secondary | ICD-10-CM | POA: Diagnosis not present

## 2023-06-18 MED ORDER — APIXABAN 5 MG PO TABS: ORAL_TABLET | ORAL | 3 refills | Status: DC

## 2023-06-18 NOTE — Progress Notes (Addendum)
    Procedures performed today:    None.  Independent interpretation of notes and tests performed by another provider:   None.  Brief History, Exam, Impression, and Recommendations:    History of arthroplasty of both hips Jerry Shannon is now recently status post left hip arthroplasty about 2 weeks ago. He has significant bruising down the leg which I explained him is normal, he also has some fullness and swelling left lower extremity Also severe pain medial ankle distance of the malleolus. He has a negative Homans' sign, he does have discrete tenderness between the navicular and the talus medially, otherwise good motion and good strength. Dorsalis pedis and posterior tibial pulses are intact. We will going get labs as his pulse rate is fairly high today and he is status post replacement, we are evaluating for blood loss anemia though we do expect some anemia status post joint replacement. I would also like a DVT ultrasound, x-rays of the ankle. Further follow-up of the hip replacement needs to be with his surgeon.  Obstructive sleep apnea Jerry Shannon does have obstructive sleep apnea, the last sleep study have for him was from 10/03/2019 with an apnea-hypopnea index of 7.6/h. He does need new CPAP machine, we will place a referral to adult and pediatric specialists.  Acute deep vein thrombosis (DVT) of left peroneal vein (HCC) DVT ultrasound did come back positive for a nonocclusive distal to the knee peroneal vein clot. This is a DVT and will need anticoagulation for 3 to 6 months, we will use Eliquis. I have also recommended lower extremity compression stocking.    ____________________________________________ [name]. Benjamin Stain, M.D., ABFM., CAQSM., AME. Primary Care and Sports Medicine [place] [facility]  Adjunct Professor of Family Medicine  [place] [employer] [facility] [employer]  Restaurant manager, fast food

## 2023-06-18 NOTE — Assessment & Plan Note (Signed)
 Jerry Shannon does have obstructive sleep apnea, the last sleep study have for him was from 10/03/2019 with an apnea-hypopnea index of 7.6/h. He does need new CPAP machine, we will place a referral to adult and pediatric specialists.

## 2023-06-18 NOTE — Addendum Note (Signed)
 Addended by: Monica Becton on: 06/18/2023 04:02 PM   Modules accepted: Orders

## 2023-06-18 NOTE — Assessment & Plan Note (Signed)
 Jerry Shannon is now recently status post left hip arthroplasty about 2 weeks ago. He has significant bruising down the leg which I explained him is normal, he also has some fullness and swelling left lower extremity Also severe pain medial ankle distance of the malleolus. He has a negative Homans' sign, he does have discrete tenderness between the navicular and the talus medially, otherwise good motion and good strength. Dorsalis pedis and posterior tibial pulses are intact. We will going get labs as his pulse rate is fairly high today and he is status post replacement, we are evaluating for blood loss anemia though we do expect some anemia status post joint replacement. I would also like a DVT ultrasound, x-rays of the ankle. Further follow-up of the hip replacement needs to be with his surgeon.

## 2023-06-18 NOTE — Assessment & Plan Note (Addendum)
 DVT ultrasound did come back positive for a nonocclusive distal to the knee peroneal vein clot. This is a DVT and will need anticoagulation for 3 to 6 months, we will use Eliquis. I have also recommended lower extremity compression stocking.

## 2023-06-19 ENCOUNTER — Encounter (INDEPENDENT_AMBULATORY_CARE_PROVIDER_SITE_OTHER): Payer: Self-pay | Admitting: Sports Medicine

## 2023-06-19 DIAGNOSIS — R7989 Other specified abnormal findings of blood chemistry: Secondary | ICD-10-CM | POA: Diagnosis not present

## 2023-06-19 LAB — IRON,TIBC AND FERRITIN PANEL
Ferritin: 163 ng/mL (ref 30–400)
Iron Saturation: 15 % (ref 15–55)
Iron: 57 ug/dL (ref 38–169)
Total Iron Binding Capacity: 392 ug/dL (ref 250–450)
UIBC: 335 ug/dL (ref 111–343)

## 2023-06-19 LAB — CBC
Hematocrit: 42 % (ref 37.5–51.0)
Hemoglobin: 14 g/dL (ref 13.0–17.7)
MCH: 31 pg (ref 26.6–33.0)
MCHC: 33.3 g/dL (ref 31.5–35.7)
MCV: 93 fL (ref 79–97)
Platelets: 430 10*3/uL (ref 150–450)
RBC: 4.52 x10E6/uL (ref 4.14–5.80)
RDW: 12.5 % (ref 11.6–15.4)
WBC: 6.9 10*3/uL (ref 3.4–10.8)

## 2023-06-19 LAB — B12 AND FOLATE PANEL
Folate: >20 ng/mL (ref >3.0)
Vitamin B-12: 350 pg/mL (ref 232–1245)

## 2023-06-21 ENCOUNTER — Ambulatory Visit: Payer: Self-pay | Admitting: *Deleted

## 2023-06-21 NOTE — Telephone Encounter (Signed)

## 2023-06-21 NOTE — Telephone Encounter (Signed)
  Chief Complaint: medication concerns: apixaban (ELIQUIS) 5 MG TABS tablet  Symptoms: Recent DVT diagnosis- patient would like PCP to review medications and make sure he can continue his current regimen. Please let him know if he needs to stop anything listed while taking apixaban   Disposition: [] ED /[] Urgent Care (no appt availability in office) / [] Appointment(In office/virtual)/ []  [facility]/ [] Home Care/ [] Refused Recommended Disposition /[]  Mobile Bus/ [x]  Follow-up with PCP Additional Notes: Patient is requesting PCP review medications and make recommendations. ( Patient is most concerned about his Ibuprofen- he does not want to stop if he does not have to)

## 2023-06-21 NOTE — Telephone Encounter (Signed)
 Copied From CRM [id number]. Reason for Triage: Patient was treated for a blood clot over the weekend and prescribed Eloquis. Patient is also on  Baclofen 5 MG TABS & ibuprofen (IBU) 600 MG tablet and was told to seek medical advice from his provider before taking all 3 medication. Patient would like a call back at [phone].  Reason for Disposition  [1] Caller has URGENT medicine question about med that PCP or specialist prescribed AND [2] triager unable to answer question  Answer Assessment - Initial Assessment Questions 1. NAME of MEDICINE: "What medicine(s) are you calling about?"   apixaban (ELIQUIS) 5 MG TABS tablet  - has not started.   2. QUESTION: "What is your question?" (e.g., double dose of medicine, side effect)     Patient takes Ibuprofen 600 3xday, ASA 81mg  2/day, Baclofen 5 mg, tramadol for back.  3. PRESCRIBER: "Who prescribed the medicine?" Reason: if prescribed by specialist, call should be referred to that group.     PCP 4. SYMPTOMS: "Do you have any symptoms?" If Yes, ask: "What symptoms are you having?"  "How bad are the symptoms (e.g., mild, moderate, severe)     Patient was diagnosed with blood clot- over the weekend- he is concerned about what medications he is to continue and stop. Patient really wants to continue his Ibuprofen if possible- otherwise it is difficult for him to walk.  Protocols used: Medication Question Call-A-AH

## 2023-06-21 NOTE — Telephone Encounter (Signed)
 Attempted to contact [facility]- Call can not completed as dialed message- unable to call - message sent

## 2023-06-24 ENCOUNTER — Encounter: Payer: Self-pay | Admitting: Sports Medicine

## 2023-06-24 DIAGNOSIS — Z471 Aftercare following joint replacement surgery: Secondary | ICD-10-CM | POA: Diagnosis not present

## 2023-06-29 ENCOUNTER — Encounter: Payer: Self-pay | Admitting: Sports Medicine

## 2023-06-29 ENCOUNTER — Telehealth (INDEPENDENT_AMBULATORY_CARE_PROVIDER_SITE_OTHER): Admitting: Sports Medicine

## 2023-06-29 DIAGNOSIS — I82452 Acute embolism and thrombosis of left peroneal vein: Secondary | ICD-10-CM | POA: Diagnosis not present

## 2023-06-29 NOTE — Assessment & Plan Note (Addendum)
 Jerry Shannon is a very pleasant 53 year old male, he had a hip arthroplasty in late March, he saw me back about a week and a half ago with increasing discomfort left leg, we obtained a DVT ultrasound that did reveal a left peroneal vein DVT distal to the knee. We did suggest compression stockings and anticoagulation for 3 to 6 months. He is doing the compression stockings, his leg feels significantly better, he understands he needs to continue the anticoagulation for 3 to 6 months, we will do a follow-up after about 4 months of anticoagulation, this will be mid August at which point we can get an updated ultrasound, if full clearance of the clot we can discontinue anticoagulation as this is a provoked clot.

## 2023-06-29 NOTE — Progress Notes (Signed)
   Virtual Visit via WebEx/MyChart   I connected with  Jerry Clyne  on 06/29/23 via WebEx/MyChart/Doximity Video and verified that I am speaking with the correct person using two identifiers.   I discussed the limitations, risks, security and privacy concerns of performing an evaluation and management service by WebEx/MyChart/Doximity Video, including the higher likelihood of inaccurate diagnosis and treatment, and the availability of in person appointments.  We also discussed the likely need of an additional face to face encounter for complete and high quality delivery of care.  I also discussed with the patient that there may be a patient responsible charge related to this service. The patient expressed understanding and wishes to proceed.  Provider location is in medical facility. Patient location is at their home, different from provider location. People involved in care of the patient during this telehealth encounter were myself, my nurse/medical assistant, and my front office/scheduling team member.  Review of Systems: No fevers, chills, night sweats, weight loss, chest pain, or shortness of breath.   Objective Findings:    General: Speaking full sentences, no audible heavy breathing.  Sounds alert and appropriately interactive.  Appears well.  Face symmetric.  Extraocular movements intact.  Pupils equal and round.  No nasal flaring or accessory muscle use visualized.  Independent interpretation of tests performed by another provider:   None.  Brief History, Exam, Impression, and Recommendations:    Acute deep vein thrombosis (DVT) of left peroneal vein (HCC) German is a very pleasant 53 year old male, he had a hip arthroplasty in late March, he saw me back about a week and a half ago with increasing discomfort left leg, we obtained a DVT ultrasound that did reveal a left peroneal vein DVT distal to the knee. We did suggest compression stockings and anticoagulation for 3 to 6 months. He  is doing the compression stockings, his leg feels significantly better, he understands he needs to continue the anticoagulation for 3 to 6 months, we will do a follow-up after about 4 months of anticoagulation, this will be mid August at which point we can get an updated ultrasound, if full clearance of the clot we can discontinue anticoagulation as this is a provoked clot.   I discussed the above assessment and treatment plan with the patient. The patient was provided an opportunity to ask questions and all were answered. The patient agreed with the plan and demonstrated an understanding of the instructions.   The patient was advised to call back or seek an in-person evaluation if the symptoms worsen or if the condition fails to improve as anticipated.   I provided 30 minutes of face to face and non-face-to-face time during this encounter date, time was needed to gather information, review chart, records, communicate/coordinate with staff remotely, as well as complete documentation.   ____________________________________________ Joselyn Nicely. Sandy Crumb, M.D., ABFM., CAQSM., AME. Primary Care and Sports Medicine [facility]  Adjunct Professor of [facility] Medicine  [employer]  Restaurant manager, fast food

## 2023-07-19 ENCOUNTER — Telehealth: Payer: Self-pay

## 2023-07-19 NOTE — Telephone Encounter (Signed)
 It should be auto pressure titration 5 to 20 cm H2O.

## 2023-07-19 NOTE — Telephone Encounter (Signed)
 Copied from CRM [id number]. Topic: Clinical - Medical Advice >> Jul 19, 2023  3:55 PM Kevelyn M wrote: Reason for CRM: Melanie with [facility] calling in because Dr. Elva Hamburger sent a message stating patient needs another machine, but when she spoke to the patient, patient stated he never received the first machine. She needs to know the pressures for the CPAP. Please advise. [phone]

## 2023-07-21 ENCOUNTER — Other Ambulatory Visit: Payer: Self-pay | Admitting: Sports Medicine

## 2023-07-21 DIAGNOSIS — M1612 Unilateral primary osteoarthritis, left hip: Secondary | ICD-10-CM

## 2023-07-21 DIAGNOSIS — M5416 Radiculopathy, lumbar region: Secondary | ICD-10-CM

## 2023-07-21 NOTE — Telephone Encounter (Signed)
 Spoke with Prentice Brochure, she had the wrong fax number. She will re-fax the form to be completed.

## 2023-07-30 DIAGNOSIS — G4733 Obstructive sleep apnea (adult) (pediatric): Secondary | ICD-10-CM | POA: Diagnosis not present

## 2023-08-12 DIAGNOSIS — G4733 Obstructive sleep apnea (adult) (pediatric): Secondary | ICD-10-CM | POA: Diagnosis not present

## 2023-08-12 DIAGNOSIS — I10 Essential (primary) hypertension: Secondary | ICD-10-CM | POA: Diagnosis not present

## 2023-08-12 DIAGNOSIS — R400 Somnolence: Secondary | ICD-10-CM | POA: Diagnosis not present

## 2023-08-30 DIAGNOSIS — G4733 Obstructive sleep apnea (adult) (pediatric): Secondary | ICD-10-CM | POA: Diagnosis not present

## 2023-09-29 DIAGNOSIS — G4733 Obstructive sleep apnea (adult) (pediatric): Secondary | ICD-10-CM | POA: Diagnosis not present

## 2023-10-15 ENCOUNTER — Ambulatory Visit

## 2023-10-15 ENCOUNTER — Ambulatory Visit (INDEPENDENT_AMBULATORY_CARE_PROVIDER_SITE_OTHER): Admitting: Sports Medicine

## 2023-10-15 ENCOUNTER — Ambulatory Visit: Payer: Self-pay | Admitting: Sports Medicine

## 2023-10-15 VITALS — BP 169/82 | HR 93 | Ht 67.0 in | Wt 179.0 lb

## 2023-10-15 DIAGNOSIS — R030 Elevated blood-pressure reading, without diagnosis of hypertension: Secondary | ICD-10-CM

## 2023-10-15 DIAGNOSIS — Z0389 Encounter for observation for other suspected diseases and conditions ruled out: Secondary | ICD-10-CM | POA: Diagnosis not present

## 2023-10-15 DIAGNOSIS — I82452 Acute embolism and thrombosis of left peroneal vein: Secondary | ICD-10-CM

## 2023-10-15 MED ORDER — BLOOD PRESSURE CUFF MISC: 0 refills | Status: AC

## 2023-10-15 MED ORDER — ASPIRIN 81 MG PO TBEC: DELAYED_RELEASE_TABLET | ORAL | 3 refills | Status: AC

## 2023-10-15 NOTE — Assessment & Plan Note (Signed)
 Pleasant 53 year old male, he had a provoked left peroneal vein DVT after hip arthroplasty. He did Eliquis  for approximately 3-1/2 months, he has now come off of it and started aspirin  twice a day Will get an updated DVT ultrasound, he is completely asymptomatic. Continue baby aspirin  twice a day for the next 3 months and then drop down to once daily.

## 2023-10-15 NOTE — Progress Notes (Addendum)
    Procedures performed today:    None.  Independent interpretation of notes and tests performed by another provider:   None.  Brief History, Exam, Impression, and Recommendations:    Acute deep vein thrombosis (DVT) of left peroneal vein (HCC) Pleasant 53 year old male, he had a provoked left peroneal vein DVT after hip arthroplasty. He did Eliquis  for approximately 3-1/2 months, he has now come off of it and started aspirin  twice a day Will get an updated DVT ultrasound, he is completely asymptomatic. Continue baby aspirin  twice a day for the next 3 months and then drop down to once daily.  Elevated blood pressure reading Intermittently elevated though it typically runs normal at home. We will send him in a blood pressure cuff and he can send me messages regarding blood pressure after sitting relaxed 10 to 15 minutes through MyChart.    ____________________________________________ Debby PARAS. Curtis, M.D., ABFM., CAQSM., AME. Primary Care and Sports Medicine [facility]  Adjunct Professor of [facility] Medicine  [employer]  Restaurant manager, fast food

## 2023-10-15 NOTE — Assessment & Plan Note (Signed)
 Intermittently elevated though it typically runs normal at home. We will send him in a blood pressure cuff and he can send me messages regarding blood pressure after sitting relaxed 10 to 15 minutes through MyChart.

## 2023-10-15 NOTE — Addendum Note (Signed)
 Addended by: CURTIS DEBBY PARAS on: 10/15/2023 03:53 PM   Modules accepted: Orders

## 2023-10-16 ENCOUNTER — Encounter: Payer: Self-pay | Admitting: Sports Medicine

## 2023-10-24 ENCOUNTER — Other Ambulatory Visit: Payer: Self-pay | Admitting: Sports Medicine

## 2023-10-24 DIAGNOSIS — N5201 Erectile dysfunction due to arterial insufficiency: Secondary | ICD-10-CM

## 2023-11-09 ENCOUNTER — Encounter: Payer: Self-pay | Admitting: Sports Medicine

## 2023-11-10 ENCOUNTER — Telehealth: Payer: Self-pay | Admitting: Sports Medicine

## 2023-11-10 NOTE — Telephone Encounter (Signed)
 Copied from CRM #[id number]. Topic: Appointments - Transfer of Care >> Nov 10, 2023  8:41 AM Farrel B wrote: Pt is requesting to transfer FROM: [facility]  Pt is requesting to transfer TO: Alvan Craven  Reason for requested transfer: Dr. ONEIDA was his primary for 15 years  It is the responsibility of the team the patient would like to transfer to (Dr. Craven Alvan ) to reach out to the patient if for any reason this transfer is not acceptable.   Patient has requested someone call him back to explain to him the difference between Dr. Alvan and Dr. ONEIDA I also informed him of the Sports Medicine in [facility] he has requested information on both

## 2023-11-10 NOTE — Telephone Encounter (Signed)
 I am not taking new patients.

## 2023-11-11 NOTE — Telephone Encounter (Signed)
 I'm sorry not accepting new patient's he is aware

## 2023-11-16 ENCOUNTER — Encounter: Payer: Self-pay | Admitting: Medical-Surgical

## 2023-11-16 DIAGNOSIS — M1612 Unilateral primary osteoarthritis, left hip: Secondary | ICD-10-CM

## 2023-11-16 DIAGNOSIS — M5416 Radiculopathy, lumbar region: Secondary | ICD-10-CM

## 2023-11-17 ENCOUNTER — Other Ambulatory Visit: Payer: Self-pay

## 2023-11-17 DIAGNOSIS — M1612 Unilateral primary osteoarthritis, left hip: Secondary | ICD-10-CM

## 2023-11-17 DIAGNOSIS — M5416 Radiculopathy, lumbar region: Secondary | ICD-10-CM

## 2023-11-17 MED ORDER — TRAMADOL HCL 50 MG PO TABS: 100.0000 mg | ORAL_TABLET | Freq: Three times a day (TID) | ORAL | 1 refills | Status: DC | PRN

## 2023-12-03 ENCOUNTER — Encounter: Payer: Self-pay | Admitting: Medical-Surgical

## 2023-12-03 ENCOUNTER — Ambulatory Visit (INDEPENDENT_AMBULATORY_CARE_PROVIDER_SITE_OTHER): Admitting: Medical-Surgical

## 2023-12-03 VITALS — BP 160/70 | HR 91 | Resp 20 | Ht 67.0 in | Wt 182.0 lb

## 2023-12-03 DIAGNOSIS — M5416 Radiculopathy, lumbar region: Secondary | ICD-10-CM

## 2023-12-03 DIAGNOSIS — R030 Elevated blood-pressure reading, without diagnosis of hypertension: Secondary | ICD-10-CM

## 2023-12-03 DIAGNOSIS — G4733 Obstructive sleep apnea (adult) (pediatric): Secondary | ICD-10-CM

## 2023-12-03 DIAGNOSIS — J3489 Other specified disorders of nose and nasal sinuses: Secondary | ICD-10-CM | POA: Diagnosis not present

## 2023-12-03 DIAGNOSIS — Z7689 Persons encountering health services in other specified circumstances: Secondary | ICD-10-CM

## 2023-12-03 MED ORDER — DICLOFENAC SODIUM 75 MG PO TBEC: 75.0000 mg | DELAYED_RELEASE_TABLET | Freq: Two times a day (BID) | ORAL | 1 refills | Status: DC

## 2023-12-03 NOTE — Progress Notes (Signed)
 Established patient visit   History of Present Illness   Discussed the use of AI scribe software for clinical note transcription with the patient, who gave verbal consent to proceed.  History of Present Illness   Jerry Shannon is a 53 year old male with sleep apnea and nasal obstruction who presents with difficulties using CPAP therapy.  Obstructive sleep apnea and cpap intolerance - Significant difficulty using CPAP therapy due to nasal obstruction and sleep apnea - Multiple CPAP masks and adjustments have been unsuccessful - Frequent air leaks from the CPAP mask disturb his sleep and his wife's sleep - Attempts to sleep in a recliner to improve comfort - CPAP machine often senses leaks and increases pressure, causing discomfort and awakening - Unable to use CPAP machine effectively, resulting in persistent sleep disturbances  Nasal obstruction and deviated septum - Deviated septum and other nasal abnormalities previously identified by a physician in [place] - Chronic difficulty breathing through the nose, which interferes with CPAP use - Seeking surgical repair  Chronic back/hip pain - Chronic back/hip pain managed with tramadol  100 mg three times daily - Uses diclofenac  sparingly due to gastrointestinal side effects - History of back surgeries, but further surgical intervention not recommended by 3 different providers - Remains physically active and uses ice for pain management  Hypertension - Monitors blood pressure at home, with typical readings between 125/80 and 135/80 - Blood pressure increases to 150/90 during stressful situations or medical appointments     Physical Exam   Physical Exam Vitals and nursing note reviewed.  Constitutional:      General: He is not in acute distress.    Appearance: Normal appearance.  HENT:     Head: Normocephalic and atraumatic.  Cardiovascular:     Rate and Rhythm: Normal rate and regular rhythm.     Pulses: Normal pulses.      Heart sounds: Normal heart sounds. No murmur heard.    No friction rub. No gallop.  Pulmonary:     Effort: Pulmonary effort is normal. No respiratory distress.     Breath sounds: Normal breath sounds.  Skin:    General: Skin is warm and dry.  Neurological:     Mental Status: He is alert and oriented to person, place, and time.  Psychiatric:        Mood and Affect: Mood normal.        Behavior: Behavior normal.        Thought Content: Thought content normal.        Judgment: Judgment normal.    Assessment & Plan    Assessment and Plan    Chronic low back/hip pain with degenerative disc disease and muscle spasm Pain managed with tramadol ; diclofenac  used sparingly due to GI side effects. Previous trials of Celebrex  and meloxicam  not tolerated. Active lifestyle and ice used for pain management. PRP injections not currently needed but considering for the future. - Prescribe tramadol  100 mg three times daily as needed for pain. - Prescribe diclofenac  for severe pain episodes. - Encourage use of ice and activity for pain management. - Discuss PRP injections as future option if needed.  Obstructive sleep apnea with chronic nasal obstruction due to nasal septal deformity CPAP not tolerated due to mask fit and nasal obstruction. ENT identified septal deformity. Interested in nasal reconstruction to improve airway patency and CPAP tolerance. Concerns about current CPAP provider. - Refer to specialist for nasal reconstruction evaluation. - Investigate alternative CPAP providers. -  Discuss potential use of Inspire device and Zepbound for sleep apnea management.  White coat hypertension without diagnosis of hypertension Blood pressure generally well-controlled at home; occasional clinical elevations due to white coat syndrome and pain. - Monitor blood pressure at home. - BP recheck still elevated.   Follow up   Return in about 6 months (around 06/01/2024) for chronic disease follow up or  sooner if needed. __________________________________ Zada FREDRIK Palin, DNP, APRN, FNP-BC Primary Care and Sports Medicine [facility]

## 2023-12-06 ENCOUNTER — Encounter: Payer: Self-pay | Admitting: Medical-Surgical

## 2023-12-06 NOTE — Addendum Note (Signed)
 Addended byBETHA WILLO MINI on: 12/06/2023 07:25 PM   Modules accepted: Orders

## 2023-12-13 MED ORDER — AMBULATORY NON FORMULARY MEDICATION: 0 refills | Status: AC

## 2023-12-13 NOTE — Addendum Note (Signed)
 Addended byBETHA WILLO MINI on: 12/13/2023 07:46 AM   Modules accepted: Orders

## 2023-12-31 ENCOUNTER — Encounter: Payer: Self-pay | Admitting: Medical-Surgical

## 2023-12-31 DIAGNOSIS — G4733 Obstructive sleep apnea (adult) (pediatric): Secondary | ICD-10-CM | POA: Diagnosis not present

## 2023-12-31 DIAGNOSIS — J342 Deviated nasal septum: Secondary | ICD-10-CM | POA: Diagnosis not present

## 2023-12-31 DIAGNOSIS — S022XXA Fracture of nasal bones, initial encounter for closed fracture: Secondary | ICD-10-CM | POA: Diagnosis not present

## 2023-12-31 DIAGNOSIS — N5201 Erectile dysfunction due to arterial insufficiency: Secondary | ICD-10-CM

## 2024-01-15 ENCOUNTER — Other Ambulatory Visit: Payer: Self-pay | Admitting: Medical-Surgical

## 2024-01-15 DIAGNOSIS — M5416 Radiculopathy, lumbar region: Secondary | ICD-10-CM

## 2024-01-15 DIAGNOSIS — M1612 Unilateral primary osteoarthritis, left hip: Secondary | ICD-10-CM

## 2024-01-17 ENCOUNTER — Other Ambulatory Visit: Payer: Self-pay

## 2024-01-17 DIAGNOSIS — M1612 Unilateral primary osteoarthritis, left hip: Secondary | ICD-10-CM

## 2024-01-17 DIAGNOSIS — M5416 Radiculopathy, lumbar region: Secondary | ICD-10-CM

## 2024-01-17 MED ORDER — TRAMADOL HCL 50 MG PO TABS: 100.0000 mg | ORAL_TABLET | Freq: Three times a day (TID) | ORAL | 2 refills | Status: DC | PRN

## 2024-01-28 DIAGNOSIS — J342 Deviated nasal septum: Secondary | ICD-10-CM | POA: Diagnosis not present

## 2024-01-31 DIAGNOSIS — J342 Deviated nasal septum: Secondary | ICD-10-CM | POA: Diagnosis not present

## 2024-02-01 MED ORDER — TADALAFIL 10 MG PO TABS: ORAL_TABLET | ORAL | 3 refills | Status: AC

## 2024-02-01 NOTE — Telephone Encounter (Signed)
 Patient requesting rx rf of cialis  10mg   Last written 10/25/2023 by Dr. Curtis Last OV 12/03/2023 Upcoming appt 06/02/2024

## 2024-02-14 ENCOUNTER — Other Ambulatory Visit: Payer: Self-pay | Admitting: Medical-Surgical

## 2024-02-14 ENCOUNTER — Telehealth: Payer: Self-pay

## 2024-02-14 NOTE — Telephone Encounter (Signed)
 Copied from CRM [id number]. Topic: Clinical - Order For Equipment >> Feb 11, 2024  4:53 PM Dedra B wrote: Reason for CRM: Will from [facility] said they received a CPAP order for pt on 12/29/23. They didn't do his sleep test so he's wondering if they received it by mistake. He said if insurance info can be sent, he can send it to a DME company. Will can be reached at [phone].

## 2024-02-14 NOTE — Telephone Encounter (Signed)
 Requesting rx rf of diclofenac  75mg  Last written 12/03/2023 Last OV 12/03/2023 Upcoming appt 06/02/2024

## 2024-02-16 NOTE — Telephone Encounter (Signed)
 I left a message for patient to call back. In the last office note it stated patient was unable to use the CPAP because of issues.   [facility]  Phone: [phone] Fax: [phone]

## 2024-02-21 NOTE — Telephone Encounter (Signed)
 Jerry Shannon to [facility] (supporting Jerry Palin, NP)     02/20/24  7:47 PM Looks like im sleeping like a baby. I wont be needing the CPAP. No snoring. What a blessing. I know there is still some risk, but until they make a mask for a side sleeper its not worth it. I wake up 14-15 times a night to adjust it. Someone from your office called about supplies.   Other than the back disease and I still cant tie my shoes or hardly pick up anything off the ground, i'm doing well. I've been training about 3 hours a week, alot lighter and trying to stretch but the back wont allow it. I've had to greatly reduce my training times and weight.  I did buy a bigger car that helps me get into it a little better.    Blessed, lets just hope my back holds up enough to keep working and contributing. Thanks for helping me manage it.

## 2024-04-14 ENCOUNTER — Other Ambulatory Visit: Payer: Self-pay | Admitting: Medical-Surgical

## 2024-04-14 DIAGNOSIS — M5416 Radiculopathy, lumbar region: Secondary | ICD-10-CM

## 2024-04-14 DIAGNOSIS — M1612 Unilateral primary osteoarthritis, left hip: Secondary | ICD-10-CM

## 2024-06-02 ENCOUNTER — Ambulatory Visit: Admitting: Medical-Surgical
